# Patient Record
Sex: Female | Born: 1935 | Race: White | Hispanic: No | Smoking: Never smoker
Health system: Southern US, Community
[De-identification: ages and names within clinical notes are randomized; demographics above are authoritative.]

## PROBLEM LIST (undated history)

## (undated) DIAGNOSIS — I517 Cardiomegaly: Secondary | ICD-10-CM

## (undated) DIAGNOSIS — I890 Lymphedema, not elsewhere classified: Secondary | ICD-10-CM

## (undated) DIAGNOSIS — M8580 Other specified disorders of bone density and structure, unspecified site: Secondary | ICD-10-CM

## (undated) DIAGNOSIS — E041 Nontoxic single thyroid nodule: Secondary | ICD-10-CM

## (undated) DIAGNOSIS — N3289 Other specified disorders of bladder: Secondary | ICD-10-CM

## (undated) DIAGNOSIS — E785 Hyperlipidemia, unspecified: Secondary | ICD-10-CM

## (undated) DIAGNOSIS — I10 Essential (primary) hypertension: Secondary | ICD-10-CM

## (undated) DIAGNOSIS — K824 Cholesterolosis of gallbladder: Secondary | ICD-10-CM

## (undated) DIAGNOSIS — C50919 Malignant neoplasm of unspecified site of unspecified female breast: Secondary | ICD-10-CM

## (undated) DIAGNOSIS — R079 Chest pain, unspecified: Secondary | ICD-10-CM

## (undated) DIAGNOSIS — M1990 Unspecified osteoarthritis, unspecified site: Secondary | ICD-10-CM

## (undated) DIAGNOSIS — L719 Rosacea, unspecified: Secondary | ICD-10-CM

## (undated) DIAGNOSIS — J189 Pneumonia, unspecified organism: Secondary | ICD-10-CM

## (undated) HISTORY — DX: Hyperlipidemia, unspecified: E78.5

## (undated) HISTORY — DX: Rosacea, unspecified: L71.9

## (undated) HISTORY — DX: Chest pain, unspecified: R07.9

## (undated) HISTORY — PX: MASTECTOMY: SHX3

## (undated) HISTORY — DX: Cardiomegaly: I51.7

## (undated) HISTORY — PX: ABDOMINAL HYSTERECTOMY: SHX81

## (undated) HISTORY — PX: TONSILLECTOMY: SUR1361

## (undated) HISTORY — DX: Lymphedema, not elsewhere classified: I89.0

## (undated) HISTORY — PX: EYE SURGERY: SHX253

## (undated) HISTORY — DX: Cholesterolosis of gallbladder: K82.4

## (undated) HISTORY — DX: Essential (primary) hypertension: I10

## (undated) HISTORY — DX: Other specified disorders of bladder: N32.89

## (undated) HISTORY — DX: Other specified disorders of bone density and structure, unspecified site: M85.80

## (undated) HISTORY — DX: Nontoxic single thyroid nodule: E04.1

## (undated) HISTORY — PX: VESICOVAGINAL FISTULA CLOSURE W/ TAH: SUR271

## (undated) HISTORY — DX: Malignant neoplasm of unspecified site of unspecified female breast: C50.919

---

## 1995-01-22 HISTORY — PX: RECONSTRUCTION BREAST W/ TRAM FLAP: SUR1079

## 1997-03-08 ENCOUNTER — Ambulatory Visit (HOSPITAL_COMMUNITY): Admission: RE | Admit: 1997-03-08 | Discharge: 1997-03-08 | Payer: Self-pay | Admitting: Obstetrics and Gynecology

## 1998-03-15 ENCOUNTER — Encounter (HOSPITAL_COMMUNITY): Payer: Self-pay | Admitting: Oncology

## 1998-03-15 ENCOUNTER — Ambulatory Visit (HOSPITAL_COMMUNITY): Admission: RE | Admit: 1998-03-15 | Discharge: 1998-03-15 | Payer: Self-pay | Admitting: Oncology

## 1999-01-04 ENCOUNTER — Ambulatory Visit (HOSPITAL_COMMUNITY): Admission: RE | Admit: 1999-01-04 | Discharge: 1999-01-04 | Payer: Self-pay | Admitting: Oncology

## 1999-01-04 ENCOUNTER — Encounter (HOSPITAL_COMMUNITY): Payer: Self-pay | Admitting: Oncology

## 1999-03-21 ENCOUNTER — Encounter: Payer: Self-pay | Admitting: Obstetrics and Gynecology

## 1999-03-21 ENCOUNTER — Ambulatory Visit (HOSPITAL_COMMUNITY): Admission: RE | Admit: 1999-03-21 | Discharge: 1999-03-21 | Payer: Self-pay | Admitting: Obstetrics and Gynecology

## 2000-03-31 ENCOUNTER — Encounter: Payer: Self-pay | Admitting: Obstetrics and Gynecology

## 2000-03-31 ENCOUNTER — Ambulatory Visit (HOSPITAL_COMMUNITY): Admission: RE | Admit: 2000-03-31 | Discharge: 2000-03-31 | Payer: Self-pay | Admitting: Obstetrics and Gynecology

## 2000-06-25 ENCOUNTER — Other Ambulatory Visit: Admission: RE | Admit: 2000-06-25 | Discharge: 2000-06-25 | Payer: Self-pay | Admitting: Obstetrics and Gynecology

## 2000-06-27 ENCOUNTER — Ambulatory Visit (HOSPITAL_COMMUNITY): Admission: RE | Admit: 2000-06-27 | Discharge: 2000-06-27 | Payer: Self-pay | Admitting: Obstetrics and Gynecology

## 2000-06-27 ENCOUNTER — Encounter: Payer: Self-pay | Admitting: Obstetrics and Gynecology

## 2001-10-27 ENCOUNTER — Encounter (HOSPITAL_COMMUNITY): Payer: Self-pay | Admitting: Oncology

## 2001-10-27 ENCOUNTER — Ambulatory Visit (HOSPITAL_COMMUNITY): Admission: RE | Admit: 2001-10-27 | Discharge: 2001-10-27 | Payer: Self-pay | Admitting: Oncology

## 2002-08-03 ENCOUNTER — Ambulatory Visit (HOSPITAL_COMMUNITY): Admission: RE | Admit: 2002-08-03 | Discharge: 2002-08-03 | Payer: Self-pay | Admitting: Oncology

## 2002-08-03 ENCOUNTER — Encounter (HOSPITAL_COMMUNITY): Payer: Self-pay | Admitting: Oncology

## 2002-11-03 ENCOUNTER — Ambulatory Visit (HOSPITAL_COMMUNITY): Admission: RE | Admit: 2002-11-03 | Discharge: 2002-11-03 | Payer: Self-pay | Admitting: *Deleted

## 2002-12-22 ENCOUNTER — Emergency Department (HOSPITAL_COMMUNITY): Admission: EM | Admit: 2002-12-22 | Discharge: 2002-12-22 | Payer: Self-pay | Admitting: Emergency Medicine

## 2003-05-10 ENCOUNTER — Ambulatory Visit (HOSPITAL_BASED_OUTPATIENT_CLINIC_OR_DEPARTMENT_OTHER): Admission: RE | Admit: 2003-05-10 | Discharge: 2003-05-10 | Payer: Self-pay | Admitting: Plastic Surgery

## 2003-05-10 ENCOUNTER — Ambulatory Visit (HOSPITAL_COMMUNITY): Admission: RE | Admit: 2003-05-10 | Discharge: 2003-05-10 | Payer: Self-pay | Admitting: Plastic Surgery

## 2004-04-30 ENCOUNTER — Ambulatory Visit: Payer: Self-pay | Admitting: Oncology

## 2004-05-01 ENCOUNTER — Ambulatory Visit (HOSPITAL_COMMUNITY): Admission: RE | Admit: 2004-05-01 | Discharge: 2004-05-01 | Payer: Self-pay | Admitting: Oncology

## 2005-04-30 ENCOUNTER — Ambulatory Visit: Payer: Self-pay | Admitting: Oncology

## 2005-04-30 LAB — CBC WITH DIFFERENTIAL/PLATELET
BASO%: 1 % (ref 0.0–2.0)
Eosinophils Absolute: 0.1 10*3/uL (ref 0.0–0.5)
HCT: 43.9 % (ref 34.8–46.6)
LYMPH%: 33 % (ref 14.0–48.0)
MONO#: 0.4 10*3/uL (ref 0.1–0.9)
NEUT#: 3.6 10*3/uL (ref 1.5–6.5)
Platelets: 240 10*3/uL (ref 145–400)
RBC: 4.93 10*6/uL (ref 3.70–5.32)
WBC: 6.2 10*3/uL (ref 3.9–10.0)
lymph#: 2 10*3/uL (ref 0.9–3.3)

## 2005-04-30 LAB — COMPREHENSIVE METABOLIC PANEL
ALT: 11 U/L (ref 0–40)
Albumin: 4.4 g/dL (ref 3.5–5.2)
CO2: 24 mEq/L (ref 19–32)
Calcium: 9.2 mg/dL (ref 8.4–10.5)
Chloride: 107 mEq/L (ref 96–112)
Glucose, Bld: 97 mg/dL (ref 70–99)
Potassium: 4.2 mEq/L (ref 3.5–5.3)
Sodium: 141 mEq/L (ref 135–145)
Total Bilirubin: 0.8 mg/dL (ref 0.3–1.2)
Total Protein: 7.3 g/dL (ref 6.0–8.3)

## 2006-04-29 ENCOUNTER — Ambulatory Visit: Payer: Self-pay | Admitting: Oncology

## 2006-04-30 LAB — COMPREHENSIVE METABOLIC PANEL
AST: 15 U/L (ref 0–37)
Albumin: 4.3 g/dL (ref 3.5–5.2)
Alkaline Phosphatase: 39 U/L (ref 39–117)
BUN: 18 mg/dL (ref 6–23)
Creatinine, Ser: 0.79 mg/dL (ref 0.40–1.20)
Glucose, Bld: 98 mg/dL (ref 70–99)
Total Bilirubin: 0.6 mg/dL (ref 0.3–1.2)

## 2006-04-30 LAB — CBC WITH DIFFERENTIAL/PLATELET
Basophils Absolute: 0 10*3/uL (ref 0.0–0.1)
EOS%: 1.9 % (ref 0.0–7.0)
Eosinophils Absolute: 0.1 10*3/uL (ref 0.0–0.5)
HGB: 14.5 g/dL (ref 11.6–15.9)
LYMPH%: 37 % (ref 14.0–48.0)
MCH: 31.4 pg (ref 26.0–34.0)
MCV: 88 fL (ref 81.0–101.0)
MONO%: 7.9 % (ref 0.0–13.0)
NEUT#: 2.7 10*3/uL (ref 1.5–6.5)
NEUT%: 52.5 % (ref 39.6–76.8)
Platelets: 223 10*3/uL (ref 145–400)
RDW: 13.9 % (ref 11.3–14.5)

## 2006-05-12 ENCOUNTER — Ambulatory Visit (HOSPITAL_COMMUNITY): Admission: RE | Admit: 2006-05-12 | Discharge: 2006-05-12 | Payer: Self-pay | Admitting: Oncology

## 2006-06-24 ENCOUNTER — Encounter: Admission: RE | Admit: 2006-06-24 | Discharge: 2006-06-24 | Payer: Self-pay | Admitting: Oncology

## 2006-11-03 ENCOUNTER — Encounter (INDEPENDENT_AMBULATORY_CARE_PROVIDER_SITE_OTHER): Payer: Self-pay | Admitting: Obstetrics and Gynecology

## 2006-11-04 ENCOUNTER — Inpatient Hospital Stay (HOSPITAL_COMMUNITY): Admission: RE | Admit: 2006-11-04 | Discharge: 2006-11-05 | Payer: Self-pay | Admitting: Obstetrics and Gynecology

## 2007-05-06 ENCOUNTER — Ambulatory Visit: Payer: Self-pay | Admitting: Oncology

## 2007-05-11 LAB — COMPREHENSIVE METABOLIC PANEL
Albumin: 4.3 g/dL (ref 3.5–5.2)
Alkaline Phosphatase: 47 U/L (ref 39–117)
BUN: 9 mg/dL (ref 6–23)
CO2: 26 mEq/L (ref 19–32)
Glucose, Bld: 88 mg/dL (ref 70–99)
Total Bilirubin: 0.6 mg/dL (ref 0.3–1.2)

## 2007-05-11 LAB — CBC WITH DIFFERENTIAL/PLATELET
Basophils Absolute: 0 10*3/uL (ref 0.0–0.1)
Eosinophils Absolute: 0.1 10*3/uL (ref 0.0–0.5)
HCT: 39.8 % (ref 34.8–46.6)
HGB: 13.9 g/dL (ref 11.6–15.9)
LYMPH%: 38.5 % (ref 14.0–48.0)
MCV: 88.3 fL (ref 81.0–101.0)
MONO%: 7.3 % (ref 0.0–13.0)
NEUT#: 3.1 10*3/uL (ref 1.5–6.5)
Platelets: 235 10*3/uL (ref 145–400)

## 2007-05-11 LAB — LACTATE DEHYDROGENASE: LDH: 212 U/L (ref 94–250)

## 2007-06-30 ENCOUNTER — Encounter: Admission: RE | Admit: 2007-06-30 | Discharge: 2007-06-30 | Payer: Self-pay | Admitting: Obstetrics and Gynecology

## 2007-07-02 ENCOUNTER — Encounter: Admission: RE | Admit: 2007-07-02 | Discharge: 2007-07-02 | Payer: Self-pay | Admitting: Internal Medicine

## 2007-07-14 ENCOUNTER — Ambulatory Visit (HOSPITAL_COMMUNITY): Admission: RE | Admit: 2007-07-14 | Discharge: 2007-07-14 | Payer: Self-pay | Admitting: *Deleted

## 2007-07-14 ENCOUNTER — Encounter (INDEPENDENT_AMBULATORY_CARE_PROVIDER_SITE_OTHER): Payer: Self-pay | Admitting: *Deleted

## 2008-01-19 ENCOUNTER — Ambulatory Visit (HOSPITAL_COMMUNITY): Admission: RE | Admit: 2008-01-19 | Discharge: 2008-01-19 | Payer: Self-pay | Admitting: *Deleted

## 2008-03-04 ENCOUNTER — Encounter: Admission: RE | Admit: 2008-03-04 | Discharge: 2008-03-04 | Payer: Self-pay | Admitting: Internal Medicine

## 2008-05-06 ENCOUNTER — Ambulatory Visit: Payer: Self-pay | Admitting: Oncology

## 2008-05-16 LAB — CBC WITH DIFFERENTIAL/PLATELET
Basophils Absolute: 0 10*3/uL (ref 0.0–0.1)
EOS%: 1.4 % (ref 0.0–7.0)
HGB: 15.3 g/dL (ref 11.6–15.9)
MCH: 31 pg (ref 25.1–34.0)
MCV: 90.2 fL (ref 79.5–101.0)
MONO%: 7.1 % (ref 0.0–14.0)
NEUT%: 54.5 % (ref 38.4–76.8)
RDW: 14.6 % — ABNORMAL HIGH (ref 11.2–14.5)

## 2008-05-16 LAB — COMPREHENSIVE METABOLIC PANEL
AST: 15 U/L (ref 0–37)
Alkaline Phosphatase: 42 U/L (ref 39–117)
BUN: 12 mg/dL (ref 6–23)
Creatinine, Ser: 0.84 mg/dL (ref 0.40–1.20)
Potassium: 4.3 mEq/L (ref 3.5–5.3)
Total Bilirubin: 0.8 mg/dL (ref 0.3–1.2)

## 2008-07-05 ENCOUNTER — Encounter: Admission: RE | Admit: 2008-07-05 | Discharge: 2008-07-05 | Payer: Self-pay | Admitting: Oncology

## 2008-09-07 ENCOUNTER — Encounter: Admission: RE | Admit: 2008-09-07 | Discharge: 2008-09-07 | Payer: Self-pay | Admitting: Internal Medicine

## 2009-05-15 ENCOUNTER — Ambulatory Visit: Payer: Self-pay | Admitting: Oncology

## 2009-05-16 LAB — CBC WITH DIFFERENTIAL/PLATELET
HCT: 43.2 % (ref 34.8–46.6)
LYMPH%: 33.4 % (ref 14.0–49.7)
MCH: 31.4 pg (ref 25.1–34.0)
MONO#: 0.4 10*3/uL (ref 0.1–0.9)
NEUT#: 3.1 10*3/uL (ref 1.5–6.5)
RBC: 4.74 10*6/uL (ref 3.70–5.45)
lymph#: 1.8 10*3/uL (ref 0.9–3.3)

## 2009-05-16 LAB — COMPREHENSIVE METABOLIC PANEL
ALT: 16 U/L (ref 0–35)
Alkaline Phosphatase: 48 U/L (ref 39–117)
BUN: 17 mg/dL (ref 6–23)
CO2: 27 mEq/L (ref 19–32)
Creatinine, Ser: 0.86 mg/dL (ref 0.40–1.20)
Glucose, Bld: 75 mg/dL (ref 70–99)
Total Bilirubin: 0.8 mg/dL (ref 0.3–1.2)

## 2009-07-09 ENCOUNTER — Inpatient Hospital Stay (HOSPITAL_COMMUNITY): Admission: EM | Admit: 2009-07-09 | Discharge: 2009-07-11 | Payer: Self-pay | Admitting: Emergency Medicine

## 2009-07-09 ENCOUNTER — Ambulatory Visit: Payer: Self-pay | Admitting: Cardiology

## 2009-07-10 ENCOUNTER — Encounter (INDEPENDENT_AMBULATORY_CARE_PROVIDER_SITE_OTHER): Payer: Self-pay | Admitting: Emergency Medicine

## 2009-07-12 ENCOUNTER — Encounter: Payer: Self-pay | Admitting: Cardiovascular Disease

## 2009-07-17 ENCOUNTER — Telehealth (INDEPENDENT_AMBULATORY_CARE_PROVIDER_SITE_OTHER): Payer: Self-pay | Admitting: *Deleted

## 2009-07-18 ENCOUNTER — Encounter: Payer: Self-pay | Admitting: Cardiology

## 2009-07-18 ENCOUNTER — Ambulatory Visit: Payer: Self-pay | Admitting: Cardiology

## 2009-07-18 ENCOUNTER — Ambulatory Visit: Payer: Self-pay

## 2009-07-18 ENCOUNTER — Encounter (HOSPITAL_COMMUNITY): Admission: RE | Admit: 2009-07-18 | Discharge: 2009-09-27 | Payer: Self-pay | Admitting: Internal Medicine

## 2009-07-25 DIAGNOSIS — I10 Essential (primary) hypertension: Secondary | ICD-10-CM | POA: Insufficient documentation

## 2009-07-25 DIAGNOSIS — E785 Hyperlipidemia, unspecified: Secondary | ICD-10-CM

## 2009-07-27 ENCOUNTER — Ambulatory Visit: Payer: Self-pay | Admitting: Internal Medicine

## 2009-07-27 DIAGNOSIS — R079 Chest pain, unspecified: Secondary | ICD-10-CM | POA: Insufficient documentation

## 2009-08-01 ENCOUNTER — Encounter: Admission: RE | Admit: 2009-08-01 | Discharge: 2009-08-01 | Payer: Self-pay | Admitting: Internal Medicine

## 2009-08-08 ENCOUNTER — Encounter: Admission: RE | Admit: 2009-08-08 | Discharge: 2009-08-08 | Payer: Self-pay | Admitting: Obstetrics and Gynecology

## 2009-09-18 DIAGNOSIS — Z862 Personal history of diseases of the blood and blood-forming organs and certain disorders involving the immune mechanism: Secondary | ICD-10-CM

## 2009-09-18 DIAGNOSIS — K828 Other specified diseases of gallbladder: Secondary | ICD-10-CM

## 2009-09-18 DIAGNOSIS — M949 Disorder of cartilage, unspecified: Secondary | ICD-10-CM

## 2009-09-18 DIAGNOSIS — L719 Rosacea, unspecified: Secondary | ICD-10-CM

## 2009-09-18 DIAGNOSIS — Z8639 Personal history of other endocrine, nutritional and metabolic disease: Secondary | ICD-10-CM

## 2009-09-18 DIAGNOSIS — M899 Disorder of bone, unspecified: Secondary | ICD-10-CM | POA: Insufficient documentation

## 2009-09-18 DIAGNOSIS — N3289 Other specified disorders of bladder: Secondary | ICD-10-CM

## 2009-09-18 HISTORY — DX: Rosacea, unspecified: L71.9

## 2009-09-18 HISTORY — DX: Other specified diseases of gallbladder: K82.8

## 2009-09-19 ENCOUNTER — Ambulatory Visit: Payer: Self-pay | Admitting: Cardiovascular Disease

## 2010-02-11 ENCOUNTER — Encounter: Payer: Self-pay | Admitting: Obstetrics and Gynecology

## 2010-02-22 NOTE — Progress Notes (Signed)
Summary: Nuclear Pre-Procedure  Phone Note Outgoing Call Call back at [facility] Phone [phone]   Call placed by: Stanton Kidney, EMT-P,  July 17, 2009 2:21 PM Action Taken: Phone Call Completed Summary of Call: Left message with information on Myoview Information Sheet (see scanned document for details).     Nuclear Med Background Indications for Stress Test: Evaluation for Ischemia, Post Hospital  Indications Comments: 07/09/09 CP: M/I R/O, (-) enzymes  History: Echo, History of Chemo, Myocardial Perfusion Study  History Comments: 12/04 MPS: EF=62%, NL 6/11 Echo: EF=60%, LVH  Symptoms: Chest Pain, Dizziness, DOE, Nausea    Nuclear Pre-Procedure Cardiac Risk Factors: Family History - CAD, Hypertension, Lipids

## 2010-02-22 NOTE — Assessment & Plan Note (Signed)
Summary: Cardiology Nuclear Study  Nuclear Med Background Indications for Stress Test: Evaluation for Ischemia, Post Hospital  Indications Comments: 07/09/09 chest pain, pneumonia; MI r/o  History: Echo, History of Chemo, Myocardial Perfusion Study  History Comments: '04 EAV:[id number], EF=62%; 6/11 Echo: EF=60%, LVH  Symptoms: Chest Pain, Dizziness, DOE, Fatigue, Nausea, Rapid HR, SOB  Symptoms Comments: Last episode of CP:10-days ago.   Nuclear Pre-Procedure Cardiac Risk Factors: Family History - CAD, History of Smoking, Hypertension, Lipids Caffeine/Decaff Intake: None NPO After: 9:00 PM Lungs: Clear IV 0.9% NS with Angio Cath: 22g     IV Site: (L) Wrist IV Started by: Irean Hong RN Chest Size (in) 36     Cup Size C     Height (in): 65.5 Weight (lb): 174 BMI: 28.62  Nuclear Med Study 1 or 2 day study:  1 day     Stress Test Type:  Stress Reading MD:  Willa Rough, MD     Referring MD:  Charlton Haws, MD Resting Radionuclide:  Technetium 30m Tetrofosmin     Resting Radionuclide Dose:  10.8 mCi  Stress Radionuclide:  Technetium 13m Tetrofosmin     Stress Radionuclide Dose:  33 mCi   Stress Protocol Exercise Time (min):  6:01 min     Max HR:  133 bpm     Predicted Max HR:  148 bpm  Max Systolic BP: 180 mm Hg     Percent Max HR:  89.86 %     METS: 7.0 Rate Pressure Product:  91478    Stress Test Technologist:  Rea College CMA-N     Nuclear Technologist:  Domenic Polite CNMT  Rest Procedure  Myocardial perfusion imaging was performed at rest 45 minutes following the intravenous administration of Myoview Technetium 76m Tetrofosmin.  Stress Procedure  The patient exercised for 6:01.  The patient stopped due to fatigue and denied any chest pain.  There were nonspecific ST-T wave changes, occasional PAC's and a short burst of PSVT.  Myoview was injected at peak exercise and myocardial perfusion imaging was performed after a brief delay.  QPS Raw Data Images:  Normal; no  motion artifact; normal heart/lung ratio. Stress Images:  There is normal uptake in all areas. Rest Images:  Normal homogeneous uptake in all areas of the myocardium. Subtraction (SDS):  No evidence of ischemia. Transient Ischemic Dilatation:  .99  (Normal <1.22)  Lung/Heart Ratio:  .26  (Normal <0.45)  Quantitative Gated Spect Images QGS EDV:  89 ml QGS ESV:  28 ml QGS EF:  68 % QGS cine images:  Normal motion  Findings Normal nuclear study      Overall Impression  Exercise Capacity: Fair exercise capacity. BP Response: Normal blood pressure response. Clinical Symptoms: No chest pain ECG Impression: No significant ST segment change suggestive of ischemia. Overall Impression Comments: Brief SVT with stress. There is no scar or ischemia.  Appended Document: Cardiology Nuclear Study normal nuclear  ? whose patient  Appended Document: Cardiology Nuclear Study appt 07-27-09 to discuss

## 2010-02-22 NOTE — Assessment & Plan Note (Signed)
Summary: eph/ f/u on stress test/ gd   Visit Type:  Follow-up  CC:  left arm tingling.  History of Present Illness: this is a 75 year old white female patient who was hospitalized for community-acquired pneumonia 219 2011. She was having some sharp shooting chest pain and left arm tingling. She ruled out for an MI. Echocardiogram showed LVH with normal LV function ejection fraction 60%. She was scheduled for a stress Myoview as an outpatient which was performed July 18, 2009. This showed normal LV function, normal wall motion, no ischemia, there was brief SVT with stress but no scar or ischemia.  The patient has had ongoing left arm tingling that is pretty much constant. She has had some neck pain and was told that pneumonia could have caused a nerve problem. She denies chest tightness, pressure, dyspnea, dyspnea on exertion, dizziness, palpitations, or presyncope.  Current Medications (verified): 1)  Crestor 10 Mg Tabs (Rosuvastatin Calcium) .... Take One Daily 2)  Micardis 40 Mg Tabs (Telmisartan) .... Take One Daily 3)  Fish Oil 1200 Mg Caps (Omega-3 Fatty Acids) .... Take Two Daily 4)  Calcium-Vitamin D 250-125 Mg-Unit Tabs (Calcium Carbonate-Vitamin D) 5)  Aspir-Low 81 Mg Tbec (Aspirin) .... Take One Daily  Allergies: 1)  ! Sulfa  Past History:  Past Medical History: Last updated: 07/25/2009 Significant for hypertension and hyperlipidemia.   She has a history of breast cancer and has had a prior right mastectomy   and chemotherapy.  She has had a prior hysterectomy and tonsillectomy.      Family History: Reviewed history from 07/25/2009 and no changes required.   Positive for coronary artery disease as her father died   of an MI at age 79.  Her mother had congestive heart failure and died at   age 85.   Review of Systems       see history of present illness  Vital Signs:  Patient profile:   75 year old female Height:      65.5 inches Weight:      180 pounds Pulse  rate:   72 / minute Pulse rhythm:   regular BP sitting:   148 / 80  (left arm)  Vitals Entered By: Jacquelin Hawking, CMA (July 27, 2009 12:16 PM)  Physical Exam  General:   Well-nournished, in no acute distress. Neck: No JVD, HJR, Bruit, or thyroid enlargement Lungs: No tachypnea, clear without wheezing, rales, or rhonchi Cardiovascular: RRR, PMI not displaced, heart sounds normal, no murmurs, gallops, bruit, thrill, or heave. Abdomen: BS normal. Soft without organomegaly, masses, lesions or tenderness. Extremities: without cyanosis, clubbing or edema. Good distal pulses bilateral SKin: Warm, no lesions or rashes  Musculoskeletal: No deformities Neuro: no focal signs    Impression & Recommendations:  Problem # 1:  CHEST PAIN-UNSPECIFIED (ICD-786.50) Patient had atypical chest pain in setting of community-acquired pneumonia. She ruled out for an MI. Stressed Myoview was normal July 18, 2009. Follow up in one year. Her updated medication list for this problem includes:    Aspir-low 81 Mg Tbec (Aspirin) .Marland Kitchen... Take one daily  Problem # 2:  HYPERLIPIDEMIA (ICD-272.4) treated Her updated medication list for this problem includes:    Crestor 10 Mg Tabs (Rosuvastatin calcium) .Marland Kitchen... Take one daily  Problem # 3:  HYPERTENSION (ICD-401.9) controlled Her updated medication list for this problem includes:    Micardis 40 Mg Tabs (Telmisartan) .Marland Kitchen... Take one daily    Aspir-low 81 Mg Tbec (Aspirin) .Marland Kitchen... Take one daily  Patient Instructions:  1)  Your physician recommends that you schedule a follow-up appointment in: 12 months with Dr. Eden Emms . The office will mail you a reminder letter 2 months prior appointment date.

## 2010-02-22 NOTE — Progress Notes (Signed)
Summary: [facility]   Imported By: Earl Many 09/19/2009 11:42:52  _____________________________________________________________________  External Attachment:    Type:   Image     Comment:   External Document

## 2010-02-22 NOTE — Assessment & Plan Note (Signed)
Summary: ROV/DM   History of Present Illness: this is a 75 year old white female patient who was hospitalized for community-acquired pneumonia 219 2011. She was having some sharp shooting chest pain and left arm tingling. She ruled out for an MI. Echocardiogram showed LVH with normal LV function ejection fraction 60%. She was scheduled for a stress Myoview as an outpatient which was performed July 18, 2009. This showed normal LV function, normal wall motion, no ischemia, there was brief SVT with stress but no scar or ischemia. She currently has no complaints.  I think that the multiple previous cardiac referrals from Dr Renne Crigler have mostly been because of a positive family history in both mother and father.  Current Problems (verified): 1)  Chest Pain-unspecified  (ICD-786.50) 2)  Hyperlipidemia  (ICD-272.4) 3)  Hypertension  (ICD-401.9) 4)  Polyp, Gallbladder  (ICD-575.8) 5)  Thyroid Nodule, Hx of  (ICD-V12.2) 6)  Irritable Bladder  (ICD-596.8) 7)  Acne Rosacea  (ICD-695.3) 8)  Osteopenia  (ICD-733.90)  Current Medications (verified): 1)  Crestor 10 Mg Tabs (Rosuvastatin Calcium) .... Take One Daily 2)  Micardis 40 Mg Tabs (Telmisartan) .... Take One Daily 3)  Fish Oil 1200 Mg Caps (Omega-3 Fatty Acids) .... Take Two Daily 4)  Calcium 600 1500 Mg Tabs (Calcium Carbonate) .... 2 Tabs  By Mouth Once Daily 5)  Aspir-Low 81 Mg Tbec (Aspirin) .... Take One Daily  Allergies (verified): 1)  ! Sulfa  Past History:  Past Medical History: Last updated: 09/18/2009 Histiry of cardiomegaly but with normal echocardiogram and cardiac workup by Dr. Eden Emms  CHEST PAIN-UNSPECIFIED   Significant for hypertension and hyperlipidemia.   POLYP, GALLBLADDER  THYROID NODULE, HX OF  IRRITABLE BLADDER  ACNE ROSACEA OSTEOPENIA  She has a history of breast cancer and has had a prior right mastectomy  and chemotherapy.  She has had a prior hysterectomy and tonsillectomy.   Chronic lymphedema of the  right arm status post lumph node infection    History of prominent clavicle  Past Surgical History: Last updated: 07/25/2009  She has had a prior hysterectomy and tonsillectomy, and mastectomy.     Family History: Last updated: 07/25/2009   Positive for coronary artery disease as her father died   of an MI at age 51.  Her mother had congestive heart failure and died at   age 71.   Social History: Last updated: 07/25/2009  She does not smoke.  She occasionally consumes alcohol.   Review of Systems       Denies fever, malais, weight loss, blurry vision, decreased visual acuity, cough, sputum, SOB, hemoptysis, pleuritic pain, palpitaitons, heartburn, abdominal pain, melena, lower extremity edema, claudication, or rash.   Vital Signs:  Patient profile:   75 year old female Height:      65 inches Weight:      178 pounds BMI:     29.73 Pulse rate:   83 / minute Resp:     12 per minute BP sitting:   115 / 66  (left arm)  Vitals Entered By: Kem Parkinson (September 19, 2009 9:09 AM)  Physical Exam  General:  Affect appropriate Healthy:  appears stated age HEENT: normal Neck supple with no adenopathy JVP normal no bruits no thyromegaly Lungs clear with no wheezing and good diaphragmatic motion Heart:  S1/S2 no murmur,rub, gallop or click PMI normal Abdomen: benighn, BS positve, no tenderness, no AAA no bruit.  No HSM or HJR Distal pulses intact with no bruits No edema Neuro non-focal Skin  warm and dry    Impression & Recommendations:  Problem # 1:  CHEST PAIN-UNSPECIFIED (ICD-786.50) Resolved with normal myovue.  Continue ASA Her updated medication list for this problem includes:    Aspir-low 81 Mg Tbec (Aspirin) .Marland Kitchen... Take one daily  Problem # 2:  HYPERLIPIDEMIA (ICD-272.4) Labs per Dr Renne Crigler continue statin Her updated medication list for this problem includes:    Crestor 10 Mg Tabs (Rosuvastatin calcium) .Marland Kitchen... Take one daily  Problem # 3:  HYPERTENSION  (ICD-401.9) Well controlled Her updated medication list for this problem includes:    Micardis 40 Mg Tabs (Telmisartan) .Marland Kitchen... Take one daily    Aspir-low 81 Mg Tbec (Aspirin) .Marland Kitchen... Take one daily  Patient Instructions: 1)  Your physician recommends that you schedule a follow-up appointment in: ONE YEAR

## 2010-04-08 LAB — BASIC METABOLIC PANEL
CO2: 23 mEq/L (ref 19–32)
CO2: 24 mEq/L (ref 19–32)
Calcium: 9.1 mg/dL (ref 8.4–10.5)
Chloride: 105 mEq/L (ref 96–112)
GFR calc Af Amer: >60 mL/min (ref >60)
GFR calc Af Amer: >60 mL/min (ref >60)
GFR calc non Af Amer: >60 mL/min (ref >60)
Potassium: 3.5 mEq/L (ref 3.5–5.1)
Sodium: 136 mEq/L (ref 135–145)

## 2010-04-08 LAB — POCT CARDIAC MARKERS
CKMB, poc: 1 ng/mL (ref 1.0–8.0)
Myoglobin, poc: 88.1 ng/mL (ref 12–200)

## 2010-04-08 LAB — DIFFERENTIAL
Basophils Absolute: 0 10*3/uL (ref 0.0–0.1)
Basophils Relative: 0 % (ref 0–1)
Lymphocytes Relative: 32 % (ref 12–46)
Lymphs Abs: 1.7 10*3/uL (ref 0.7–4.0)
Monocytes Absolute: 0.3 10*3/uL (ref 0.1–1.0)

## 2010-04-08 LAB — URINALYSIS, ROUTINE W REFLEX MICROSCOPIC
Bilirubin Urine: NEGATIVE
Ketones, ur: NEGATIVE mg/dL
Nitrite: NEGATIVE
Protein, ur: NEGATIVE mg/dL
Specific Gravity, Urine: 1.004 — ABNORMAL LOW (ref 1.005–1.030)

## 2010-04-08 LAB — TSH: TSH: 1.656 u[IU]/mL (ref 0.350–4.500)

## 2010-04-08 LAB — CBC
HCT: 39.2 % (ref 36.0–46.0)
HCT: 43.9 % (ref 36.0–46.0)
Hemoglobin: 13.5 g/dL (ref 12.0–15.0)
Hemoglobin: 15.3 g/dL — ABNORMAL HIGH (ref 12.0–15.0)
MCHC: 34.5 g/dL (ref 30.0–36.0)
MCHC: 34.8 g/dL (ref 30.0–36.0)
MCV: 92 fL (ref 78.0–100.0)
Platelets: 204 10*3/uL (ref 150–400)
RBC: 4.26 MIL/uL (ref 3.87–5.11)
RBC: 4.82 MIL/uL (ref 3.87–5.11)
WBC: 5.3 10*3/uL (ref 4.0–10.5)

## 2010-04-08 LAB — LIPID PANEL
LDL Cholesterol: 55 mg/dL (ref 0–99)
Total CHOL/HDL Ratio: 3 RATIO
VLDL: 32 mg/dL (ref 0–40)

## 2010-04-08 LAB — CK TOTAL AND CKMB (NOT AT ARMC)
Relative Index: 1.9 (ref 0.0–2.5)
Total CK: 138 U/L (ref 7–177)

## 2010-04-08 LAB — CARDIAC PANEL(CRET KIN+CKTOT+MB+TROPI)
CK, MB: 2.1 ng/mL (ref 0.3–4.0)
Total CK: 110 U/L (ref 7–177)

## 2010-05-08 ENCOUNTER — Other Ambulatory Visit: Payer: Self-pay | Admitting: Internal Medicine

## 2010-05-08 DIAGNOSIS — R1011 Right upper quadrant pain: Secondary | ICD-10-CM

## 2010-05-14 ENCOUNTER — Other Ambulatory Visit: Payer: Self-pay | Admitting: Dermatology

## 2010-05-16 ENCOUNTER — Ambulatory Visit
Admission: RE | Admit: 2010-05-16 | Discharge: 2010-05-16 | Disposition: A | Discharge status: Home / Self Care | Payer: MEDICARE | Source: Ambulatory Visit | Attending: Internal Medicine | Admitting: Internal Medicine

## 2010-05-16 DIAGNOSIS — R1011 Right upper quadrant pain: Secondary | ICD-10-CM

## 2010-06-05 NOTE — Op Note (Signed)
NAMESTELLA, [name]              ACCOUNT NO.:  [id number]   MEDICAL RECORD NO.:  [id number]          PATIENT TYPE:  OIB   LOCATION:  1538                         FACILITY:  [facility]   PHYSICIAN:  Mark C. Vernie Ammons, M.D.  DATE OF BIRTH:  Feb 18, 1935   DATE OF PROCEDURE:  11/03/2006  DATE OF DISCHARGE:                               OPERATIVE REPORT   PREOPERATIVE DIAGNOSIS:  Stress urinary incontinence.   POSTOPERATIVE DIAGNOSIS:  Stress urinary incontinence.   PROCEDURE:  Transobturator sling.   SURGEON:  Mark C. Vernie Ammons, M.D.   ANESTHESIA:  General.   BLOOD LOSS:  Minimal.   SPECIMENS:  None.   DRAINS:  16-French Foley catheter.   COMPLICATIONS:  None.   INDICATIONS:  The patient is a 75 year old white female with stress  urinary continence demonstrated by urodynamics and by history.  She also  was found to have some mild bladder instability.  We discussed treatment  options for this, and the patient has elected to proceed surgically,  understanding the risks and complications.   DESCRIPTION OF OPERATION:  After informed consent, the patient was  brought to the major O.R., placed on the table, administered general  anesthesia, and then moved to the dorsal lithotomy position.  Dr. Ambrose Mantle  then proceeded with transvaginal hysterectomy as well as anterior and  posterior repair.  That will be dictated by him separately.   Through the anterior vaginal wall incision that was previously made for  her anterior repair, I used Strulle scissors to dissect lateral to the  urethra on each side.  I was able to situate my index finger into this  location and palpate the undersurface of the symphysis pubis.  I noted  the urethra to be normal to palpation, and the Foley catheter balloon  was noted to be at the bladder neck, allowing me to determine the  midurethral location.   Attention was then directed to the skin overlying the obturator fossa.  I chose a spot 5 cm lateral to the  midline on both right and left sides  at the level of the clitoris.  I made a stab incision at both these  locations and then drained the bladder completely through the Foley  catheter.  I then passed the transobturator trocar through the skin  incision, through the obturator fossa at its medial aspect, and then  back behind the symphysis pubis, directing it out at the midurethral  level by palpation against my index finger.  This was performed on first  right and left sides.  I then affixed the transobturator sling to the  trocar on each side and pulled this through the skin.   The catheter was removed, and a 17-French rigid cystoscope with 70-  degrees lens was inserted in the bladder.  The bladder was inspected and  noted to be free of any tumor, stones, or worrisome lesions.  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  Ureteral orifices were noted to be  of normal configuration and position.  There was clear  reflux from the  left.  I did not see any efflux on the right, but I did not watch very  long.  I inspected the bladder and noted no injury, perforation, or  foreign body.  No lesions were found within the bladder, and no bleeding  was identified.  I therefore removed the cystoscope and noted the  urethra to be normal, intact, and without injury or lesion.   The catheter was reinserted in the bladder, and I then used antibiotic  solution to irrigate the vaginal incision.  After that, I placed a  forceps beneath the sling and removed the plastic sheathing from the  sling material on both right and left sides.  I noted no undue tension  on the sling.  It did seem to be located slightly more proximal than I  wanted, so I placed a single 3-0 Vicryl suture in a figure-of-eight  fashion through the more proximal urethra and the sling, moving it up to  the midurethral region nicely.  The wound was again  copiously irrigated,  and the excess sling material was excised at the skin level.  The skin  incisions were closed with Dermabond.  Dr. Ambrose Mantle returned and then  closed the remainder of the vaginal incision.  The patient tolerated  this portion of the procedure well, with no intraoperative  complications.  She will be observed in the hospital, and her catheter  will remain indwelling overnight and removed in the morning for a  voiding trial.      Loraine Leriche C. Vernie Ammons, M.D.  Electronically Signed     MCO/MEDQ  D:  11/03/2006  T:  11/04/2006  Job:  [id number]   cc:   Malachi Pro. Ambrose Mantle, M.D.  Fax: [phone]

## 2010-06-05 NOTE — Discharge Summary (Signed)
[name], [name]              ACCOUNT NO.:  [id number]   MEDICAL RECORD NO.:  [id number]          PATIENT TYPE:  INP   LOCATION:  1538                         FACILITY:  [facility]   PHYSICIAN:  Malachi Pro. Ambrose Mantle, M.D. DATE OF BIRTH:  October 20, 1935   DATE OF ADMISSION:  11/03/2006  DATE OF DISCHARGE:  11/05/2006                               DISCHARGE SUMMARY   HISTORY OF PRESENT ILLNESS:  A 75 year old white female with cystocele,  rectocele, uterine prolapse and stress urinary incontinence admitted for  vaginal hysterectomy, A&P repair and sling procedure.  The patient  underwent the procedure, the vaginal hysterectomy A&P repair by Dr.  Ambrose Mantle and sling procedure by Dr. Vernie Ammons on November 03, 2006.  Blood  loss was probably no more than 150-200 mL.  There were no apparent  complications.  Postoperatively, she did well.  The catheter was removed  on the first postop day.  She was unable to void, so the catheter was  reinserted and Dr. Vernie Ammons gave her instructions on how to manage the  catheter as an outpatient.  On postop day #2, she was afebrile.  Blood  pressure was normal.  Catheter was draining clear urine.  She was  tolerating liquids and solids.  She had passed flatus.  She had not had  a bowel movement.  She was ready for discharge.  The pack had been  removed on postop day #1 and was only moderately stained.  White count  on admission was 5100, hemoglobin 14.1, hematocrit 40.7, platelet count  226,000.  Comprehensive metabolic profile in a nonfasting state was  normal except for glucose of 106, alkaline phosphatase was 37.  Chest x-  ray done in April was normal.  Echocardiogram done in July 2001 was  normal.  EKG was done in May 2008 and it showed a first-degree heart  block, left ventricular hypertrophy voltage.  Pathology report is not  available.   FINAL DIAGNOSES:  1. Cystocele.  2. Rectocele.  3. Uterine prolapse.  4. Stress urinary incontinence.   OPERATION:   Vaginal hysterectomy, A&P repair, sling procedure.   FINAL CONDITION:  Improved.   INSTRUCTIONS:  No vaginal entrance, no heavy lifting, no strenuous  activity.  Call with any fever above 100.4 degrees.  Call with any  unusual problems.  Follow Dr. Margrett Rud instructions on when to remove  the catheter.  Continue her Actonel, Micardis, Crestor, aspirin, fish  oil, Caltrate, vitamin E.  She is going to suspend her Detrol for now,  and I have given her a prescription for Percocet 5/325, 30 tablets one  every 4-6 hours as needed for pain.  She is to return to see me in 2  weeks for follow-up examination.      Malachi Pro. Ambrose Mantle, M.D.  Electronically Signed     TFH/MEDQ  D:  11/05/2006  T:  11/06/2006  Job:  [id number]

## 2010-06-05 NOTE — Op Note (Signed)
[name], [name]              ACCOUNT NO.:  [id number]   MEDICAL RECORD NO.:  [id number]          PATIENT TYPE:  AMB   LOCATION:  ENDO                         FACILITY:  [facility]   PHYSICIAN:  Georgiana Spinner, M.D.    DATE OF BIRTH:  01-Nov-1935   DATE OF PROCEDURE:  DATE OF DISCHARGE:                               OPERATIVE REPORT   PROCEDURE:  Colonoscopy.   INDICATIONS:  Colon cancer screening.   ANESTHESIA:  Fentanyl 100 mcg, Versed 10 mg.   DESCRIPTION OF PROCEDURE:  With the patient mildly sedated in the left  lateral decubitus position, the Pentax videoscopic pediatric colonoscope  was inserted into the rectum, passed under direct vision to the cecum  with pressure applied.  The cecum was identified by the ileocecal valve  and appendiceal orifice, both of which were photographed.  From this  point the colonoscope was slowly withdrawn, taking circumferential views  of the colonic mucosa, stopping only in the rectum, which appeared  normal on direct and retroflexed view.  The endoscope was straightened  and withdrawn.  The patient's vital signs, pulse oximeter remained  stable.  The patient tolerated procedure well without apparent  complications.   FINDINGS:  Negative examination.   PLAN:  Repeat examination in 5-10 years.           ______________________________  Georgiana Spinner, M.D.     GMO/MEDQ  D:  01/19/2008  T:  01/19/2008  Job:  [id number]

## 2010-06-05 NOTE — Op Note (Signed)
NAMECOURTNEY, [name]              ACCOUNT NO.:  [id number]   MEDICAL RECORD NO.:  [id number]          PATIENT TYPE:  AMB   LOCATION:  ENDO                         FACILITY:  [facility]   PHYSICIAN:  Georgiana Spinner, M.D.    DATE OF BIRTH:  [birth year]   DATE OF PROCEDURE:  07/14/2007  DATE OF DISCHARGE:                               OPERATIVE REPORT   PROCEDURE:  Upper endoscopy.   INDICATIONS:  Abdominal pain.   ANESTHESIA:  1. Fentanyl 50 mcg.  2. Versed 3 mg.   PROCEDURE:  With the patient mildly sedated in the left lateral  decubitus position, the Pentax videoscopic endoscope was inserted in the  mouth and passed under direct vision through the esophagus, which  appeared normal, into the stomach.  Fundus, body, antrum showed atrophic  changes that were photographed and biopsied.  Duodenal bulb and second  portion of the duodenum appeared normal.  From this point, the endoscope  was slowly withdrawn, taking circumferential views of the duodenal  mucosa until the endoscope had been pulled back into the stomach and  placed in retroflexion to view the stomach from below.  The endoscope  was straightened and withdrawn, taking circumferential views of the  remaining gastric and esophageal mucosa.  The patient's vital signs and  pulse oximeter remained stable.  The patient tolerated the procedure  well, without apparent complications.   FINDINGS:  Atrophic changes, biopsied.  Await biopsy report.  The  patient will call me for results and follow up with me as needed as an  outpatient.           ______________________________  Georgiana Spinner, M.D.     GMO/MEDQ  D:  07/14/2007  T:  07/14/2007  Job:  [id number]

## 2010-06-05 NOTE — Op Note (Signed)
[name], [name]              ACCOUNT NO.:  [id number]   MEDICAL RECORD NO.:  [id number]          PATIENT TYPE:  OIB   LOCATION:  1538                         FACILITY:  [facility]   PHYSICIAN:  Malachi Pro. Ambrose Mantle, M.D. DATE OF BIRTH:  10/23/1935   DATE OF PROCEDURE:  11/03/2006  DATE OF DISCHARGE:                               OPERATIVE REPORT   PREOPERATIVE DIAGNOSES:  1. Cystocele.  2. Rectocele.  3. Uterine prolapse.  4. Stress urinary incontinence.  5. Possible lichen sclerosis.   POSTOPERATIVE DIAGNOSES:  1. Cystocele.  2. Rectocele.  3. Uterine prolapse.  4. Stress urinary incontinence.  5. Possible lichen sclerosis.   OPERATION:  1. Vaginal hysterectomy, anterior and posterior repair, by Malachi Pro.      Ambrose Mantle, MD, with Sherron Monday, MD, assisting.  2. Sling procedure by Veverly Fells. Vernie Ammons, MD.   The patient was brought to the operating room and placed under  satisfactory general anesthesia and placed in lithotomy position in the  [facility] stirrups.  Exam revealed thin mucosa on the perineum and the  clitoris was slightly hidden.  The uterus was posterior, normal size.  The adnexa were free of masses.  The cul-de-sac felt smooth.  The vulva,  vagina, perineum and urethra were prepped with Betadine solution.  A  Foley catheter was inserted to straight drain.  The cervix was drawn  into the operative field and a dilute solution of Neo-Synephrine was  injected at the cervicovaginal junction.  A circumferential incision was  made around the cervix.  The vaginal mucosa was undermined  circumferentially.  The posterior cul-de-sac was identified, entered  sharply, and the uterosacral ligaments were clamped, cut and suture  ligated and held.  The cardinal ligaments were then clamped, cut and  suture ligated.  The uterine vessels were clamped, cut and suture  ligated.  The bladder was kept well above the field.  I then entered the  anterior peritoneum, inverted the uterus through the  incision in the cul-  de-sac, clamped across both upper pedicles, doubly suture ligated the  right, triply suture ligated the left.  Hemostasis was adequate except  on the posterior cuff, and the posterior cuff was run with a running  locked suture of 0 Vicryl.  A pursestring suture was then placed around  the peritoneum, including the upper and lower pedicles.  I did improvise  a little bit because the suture was cut, so I did close the peritoneum  with this suture and tied it down.  The uterosacral ligaments were then  tied together in the midline with one tie of each suture on the  uterosacral ligaments and then an additional suture was used to suture  the uterosacral ligaments together.  I undermined the anterior vaginal  mucosa all the way to the urethra, developed the cystocele, imbricated  it with interrupted sutures of 0 Vicryl, cut away redundant vaginal  mucosa, and then closed the lower part of the anterior vaginal mucosa  with interrupted figure-of-eight sutures of 0 Vicryl.  I left the part  mirrors the urethra open for Dr. Vernie Ammons to work.  I then undermined the  posterior vaginal mucosa all the way to the vaginal cuff.  I developed  the rectocele, imbricated it with interrupted sutures of 0 Vicryl, cut  away redundant vaginal mucosa, and closed the vaginal mucosa in the  midline with interrupted figure-of-eight sutures of 0 Vicryl down to the  introitus and then closed the peritoneum, reapproximated the perineum  with interrupted 3-0 Vicryl sutures.  I then looked for hemostasis,  found no significant bleeding.  Dr. Vernie Ammons entered theater and was  ready to perform the sling procedure.  Blood loss for this part of the  procedure was estimated at 150 mL.  I should mention that I did a rectal  exam prior to closing the posterior vaginal mucosa and found no sutures  in the rectum and no injury to the rectum.      Malachi Pro. Ambrose Mantle, M.D.  Electronically Signed      TFH/MEDQ  D:  11/03/2006  T:  11/04/2006  Job:  [id number]

## 2010-06-05 NOTE — H&P (Signed)
[name], [name]              ACCOUNT NO.:  [id number]   MEDICAL RECORD NO.:  [id number]          PATIENT TYPE:  AMB   LOCATION:  DAY                          FACILITY:  [facility]   PHYSICIAN:  Malachi Pro. Ambrose Mantle, M.D. DATE OF BIRTH:  1935-11-13   DATE OF ADMISSION:  11/03/2006  DATE OF DISCHARGE:                              HISTORY & PHYSICAL   PRESENT ILLNESS:  This is a 75 year old white female para 2-0-0-3 who is  admitted to the hospital for vaginal hysterectomy, A&P repair, and sling  procedure.  Dr. Ambrose Mantle will do the vaginal hysterectomy/A&P repair, and  Dr. Vernie Ammons the sling.  The patient's last period was years ago,  probably in the mid 1990s.  She has had no postmenopausal bleeding.  She  has been known to have a cystocele, rectocele, and uterine prolapse for  several years.  In August 2008 she came in wanting to evaluate the  prolapse and discuss surgery.  She had some stress urinary incontinence  although she also has urgency incontinence for which she has taken  Detrol for some time.  She felt pressure in her lower abdomen when she  was on her feet or straining and she wanted it corrected.  She was  advised to see Dr. Vernie Ammons to see if a sling procedure should be done at  the time of the vaginal hysterectomy/A&P repair.  He evaluated her  thoroughly with urodynamics and felt that she would benefit from a sling  procedure.   PAST MEDICAL HISTORY:  Reveals allergy to SULFA, no latex allergies.   OPERATIONS:  1. D&C.  2. Laparoscopy.  3. Right modified radical mastectomy.  4. Tonsillectomy.  5. TRAM procedure for reconstruction of the right breast.   ILLNESSES:  1. Breast cancer.  2. High cholesterol.  3. High blood pressure.   REVIEW OF SYSTEMS:  No heart problems.  No visual problems.  No  headaches.  No bowel problems.  The patient does report, as stated in  the present illness, stress and urgency urinary incontinence.  Also,  pressure in her lower abdomen  standing on her feet.   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   She does not smoke tobacco.  She occasionally uses alcohol.   Her parents:  Her father died at 40 with a massive heart attack.  Mother  died at 23, congestive heart failure.  Siblings:  A 5 year old healthy  sister, a 80 year old healthy sister, a 47 year old brother died with  multiple issues, a 51 year old brother died with cancer.  Her children:  A 73 year old female who is healthy, a 73 year old female healthy - they  were twins, a 70 year old son is healthy.   MEDICATIONS:  Crestor, Micardis, Actonel, fish oil, and vitamins.   PHYSICAL EXAMINATION:  Blood pressure is 128/80, pulse is 80.  Weight is  182 pounds.  HEAD, EYES, EARS, NOSE AND THROAT:  No cranial abnormalities.  Extraocular movements are intact.  Nose and pharynx are clear.  NECK:  Supple without thyromegaly.  HEART:  Normal size and sounds.  No murmurs.  LUNGS:  Clear to auscultation.  The left breast is normal sitting and standing, no masses are palpable.  Right breast has a TRAM flap.  No masses are palpable there.  ABDOMEN:  Soft, nontender.  No masses are palpable.  There is scarring  in the lower abdomen transversely from the TRAM flap procedure.  Liver,  spleen and kidneys are not felt.  The vulva is remarkable for some irritation in the lower vulva, the  perineum and perianal.  The clitoris is not seen well.  The vagina is  clean.  There is moderate prolapse of the cervix as well as the bladder  and rectum.  The cervix is clean.  The uterus is posterior, deviated  slightly to the left.  The adnexa are free of masses and rectal exam is  negative.   ADMITTING IMPRESSION:  1. Uterovaginal prolapse with cystocele, rectocele, stress urinary      incontinence.  2. Hypertension.  3. Hyperlipidemia.  4. History of right breast cancer.   The patient is admitted and is prepared for vaginal hysterectomy, A&P  repair,  and sling procedure.  She has been informed of the risks of  surgery including but not limited to heart attack, stroke, pulmonary  embolus, wound disruption, hemorrhage with need for reoperation and/or  transfusion, fistula formation, nerve injury, intestinal obstruction.  She has also been informed that the surgery could shorten and narrow her  vagina that would make sexual intercourse even more difficult.  At the  present time she is sexually active about once per month.  She  understands and agrees to proceed with surgery.      Malachi Pro. Ambrose Mantle, M.D.  Electronically Signed     TFH/MEDQ  D:  11/02/2006  T:  11/03/2006  Job:  [id number]   cc:   Veverly Fells. Vernie Ammons, M.D.  Fax: [phone]

## 2010-06-08 NOTE — Op Note (Signed)
NAMEGAILYA, [name]                          ACCOUNT NO.:  [id number]   MEDICAL RECORD NO.:  [id number]                   PATIENT TYPE:  AMB   LOCATION:  DSC                                  FACILITY:  [facility]   PHYSICIAN:  Etter Sjogren, M.D.                  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   OPERATION PERFORMED:  Bilateral upper lid blepharoplasty.   SURGEON:  Etter Sjogren, M.D.   ANESTHESIA:  MAC with 1% Xylocaine with epinephrine plus bicarb.   INDICATIONS FOR PROCEDURE:  The patient is a 75 year old woman who was  referred by her ophthalmologist for a blepharoplasty.  She has had visual  field testing after complaint of upper field obstruction.  Visual field  testing confirmed that she has obstruction of her upper visual fields and  her ophthalmologist recommended blepharoplasty.  On examination, it was  indeed felt that it was due to excess skin in the upper lids.  There was no  true ptosis identified.  The nature of the procedure and the risks and  possible complications were discussed with her in great detail and she  understood all of this and the informed consent form was discussed line by  line in great detail and she wished to proceed.   DESCRIPTION OF PROCEDURE:  The patient was marked in a full upright position  in the holding area for the blepharoplasty with her eyes closed to ensure  that there would not be removal of too much skin.  She was then taken to the  operating room and placed supine. After satisfactory sedation, she was  prepped with Betadine and draped with sterile drapes.  The markings were  rechecked using a caliper to judge the lower incision in the eyelid crease  and this was 7 mm above lid margin.  The upper planned excision was also  confirmed using a forcep.  Satisfactory local anesthesia was achieved, the  skin excision was performed.  A thin strip of orbicularis oculi muscle was  removed along the superior border of the wound taking great care to avoid  dissection.  Meticulous hemostasis with the electrocautery.  Thorough  irrigation with saline and again, meticulous hemostasis with electrocautery.  After excellent hemostasis, the closure with 6-0 Prolene running  subcuticular suture.  Steri-Strips applied. The patient tolerated the  procedure well.   DISPOSITION:  Use iced eye pads every hour for 15 minutes at a time today  and tomorrow.  Expect some swelling and drainage.  No lifting or exercising.  She is to keep her head elevated.  We will see her in the office next week  as scheduled.  Etter Sjogren, M.D.    DB/MEDQ  D:  05/10/2003  T:  05/11/2003  Job:  [id number]

## 2010-06-08 NOTE — Op Note (Signed)
   [name], [name]                          ACCOUNT NO.:  [id number]   MEDICAL RECORD NO.:  [id number]                   PATIENT TYPE:  AMB   LOCATION:  ENDO                                 FACILITY:  [facility]   PHYSICIAN:  Georgiana Spinner, M.D.                 DATE OF BIRTH:  [birth year]   DATE OF PROCEDURE:  11/03/2002  DATE OF DISCHARGE:                                 OPERATIVE REPORT   PROCEDURE:  Colonoscopy.   INDICATIONS:  Colon cancer screening.   ANESTHESIA:  1. Demerol 60 mg.  2. Versed 6 mg.   DESCRIPTION OF PROCEDURE:  With the patient mildly sedated in the left  lateral decubitus position, the Olympus videoscopic colonoscope was inserted  in the rectum and passed under direct vision to the cecum, identified by  ileocecal valve and appendiceal orifice, crow's foot of the cecum.  From  this point, the colonoscope was slowly withdrawn, taking circumferential  views of colonic mucosa, stopping only in the rectum which appeared normal  on direct and retroflexed view.  The endoscope was straightened and  withdrawn.  The patient's vital signs and pulse oximeter remained stable.  The patient tolerated the procedure well without apparent complications.   FINDINGS:  1. Occasional diverticulum.  2. Otherwise, an unremarkable examination.  3. Repeat in 5-10 years.                                               Georgiana Spinner, M.D.    GMO/MEDQ  D:  11/03/2002  T:  11/03/2002  Job:  [id number]

## 2010-06-27 ENCOUNTER — Encounter: Payer: Self-pay | Admitting: Cardiovascular Disease

## 2010-07-09 ENCOUNTER — Other Ambulatory Visit: Payer: Self-pay | Admitting: Obstetrics and Gynecology

## 2010-07-09 DIAGNOSIS — Z1231 Encounter for screening mammogram for malignant neoplasm of breast: Secondary | ICD-10-CM

## 2010-08-01 ENCOUNTER — Ambulatory Visit (INDEPENDENT_AMBULATORY_CARE_PROVIDER_SITE_OTHER): Payer: Medicare Other | Admitting: Cardiovascular Disease

## 2010-08-01 ENCOUNTER — Encounter: Payer: Self-pay | Admitting: Cardiovascular Disease

## 2010-08-01 DIAGNOSIS — R079 Chest pain, unspecified: Secondary | ICD-10-CM

## 2010-08-01 DIAGNOSIS — E785 Hyperlipidemia, unspecified: Secondary | ICD-10-CM

## 2010-08-01 DIAGNOSIS — I10 Essential (primary) hypertension: Secondary | ICD-10-CM

## 2010-08-01 NOTE — Assessment & Plan Note (Signed)
Cholesterol is at goal.  Continue current dose of statin and diet Rx.  No myalgias or side effects.  F/U  LFT's in 6 months. Lab Results  Component Value Date   LDLCALC  Value: 55        Total Cholesterol/HDL:CHD Risk Coronary Heart Disease Risk Table                     Men   Women  1/2 Average Risk   3.4   3.3  Average Risk       5.0   4.4  2 X Average Risk   9.6   7.1  3 X Average Risk  23.4   11.0        Use the calculated Patient Ratio above and the CHD Risk Table to determine the patient's CHD Risk.        ATP III CLASSIFICATION (LDL):  <100     mg/dL   Optimal  100-129  mg/dL   Near or Above                    Optimal  130-159  mg/dL   Borderline  160-189  mg/dL   High  >190     mg/dL   Very High 07/10/2009             

## 2010-08-01 NOTE — Progress Notes (Signed)
this is a 75 year old white female patient who was hospitalized for community-acquired pneumonia 219 2011. She was having some sharp shooting chest pain and left arm tingling. She ruled out for an MI. Echocardiogram showed LVH with normal LV function ejection fraction 60%. She was scheduled for a stress Myoview as an outpatient which was performed July 18, 2009. This showed normal LV function, normal wall motion, no ischemia, there was brief SVT with stress but no scar or ischemia. She currently has no complaints. I think that the multiple previous cardiac referrals from Dr Renne Crigler have mostly been because of a positive family history in both mother and father.  Recendt cataract surgery with allegy to drops and continued erythema and pruritis that she has F/U with [facility]  ROS: Denies fever, malais, weight loss, blurry vision, decreased visual acuity, cough, sputum, SOB, hemoptysis, pleuritic pain, palpitaitons, heartburn, abdominal pain, melena, lower extremity edema, claudication, or rash.  All other systems reviewed and negative  General: Affect appropriate Healthy:  appears stated age HEENT: normal red eyes from allergy and cataract surgery Neck supple with no adenopathy JVP normal no bruits no thyromegaly Lungs clear with no wheezing and good diaphragmatic motion Heart:  S1/S2 no murmur,rub, gallop or click PMI normal Abdomen: benighn, BS positve, no tenderness, no AAA no bruit.  No HSM or HJR Distal pulses intact with no bruits No edema Neuro non-focal Skin warm and dry No muscular weakness   Current Outpatient Prescriptions  Medication Sig Dispense Refill  . aspirin 81 MG tablet Take 81 mg by mouth daily.        . Calcium Carbonate (CALCIUM 600) 1500 MG TABS Take 2 tablets by mouth daily.        Marland Kitchen doxycycline (DORYX) 100 MG EC tablet Take 100 mg by mouth as needed.        . Estradiol (VAGIFEM) 10 MCG TABS Place 1 tablet vaginally 3 (three) times daily.        . Omega-3 Fatty Acids  (FISH OIL) 1000 MG CAPS Take 4 capsules by mouth daily.        . rosuvastatin (CRESTOR) 10 MG tablet Take 10 mg by mouth daily.        Marland Kitchen telmisartan (MICARDIS) 40 MG tablet Take 40 mg by mouth daily.          Allergies  Sulfonamide derivatives  Electrocardiogram: 07/10/09 NSR 66 normal ECG  Assessment and Plan

## 2010-08-01 NOTE — Patient Instructions (Signed)
Your physician wants you to follow-up in: 1 year  You will receive a reminder letter in the mail two months in advance. If you don't receive a letter, please call our office to schedule the follow-up appointment.  Your physician recommends that you continue on your current medications as directed. Please refer to the Current Medication list given to you today.  

## 2010-08-01 NOTE — Assessment & Plan Note (Signed)
Well controlled.  Continue current medications and low sodium Dash type diet.    

## 2010-08-01 NOTE — Assessment & Plan Note (Signed)
Resolved with normal myovue 2011

## 2010-08-14 ENCOUNTER — Ambulatory Visit
Admission: RE | Admit: 2010-08-14 | Discharge: 2010-08-14 | Disposition: A | Discharge status: Home / Self Care | Payer: MEDICARE | Source: Ambulatory Visit | Attending: Obstetrics and Gynecology | Admitting: Obstetrics and Gynecology

## 2010-08-14 DIAGNOSIS — Z1231 Encounter for screening mammogram for malignant neoplasm of breast: Secondary | ICD-10-CM

## 2010-08-23 ENCOUNTER — Other Ambulatory Visit: Payer: Self-pay | Admitting: Internal Medicine

## 2010-08-23 DIAGNOSIS — E041 Nontoxic single thyroid nodule: Secondary | ICD-10-CM

## 2010-08-27 ENCOUNTER — Ambulatory Visit
Admission: RE | Admit: 2010-08-27 | Discharge: 2010-08-27 | Disposition: A | Discharge status: Home / Self Care | Payer: Medicare Other | Source: Ambulatory Visit | Attending: Internal Medicine | Admitting: Internal Medicine

## 2010-08-27 DIAGNOSIS — E041 Nontoxic single thyroid nodule: Secondary | ICD-10-CM

## 2010-11-01 LAB — HEMOGLOBIN AND HEMATOCRIT, BLOOD: HCT: 39

## 2010-11-01 LAB — CBC
HCT: 40.7
Hemoglobin: 14.1
MCHC: 34.7
MCV: 89
RBC: 4.58
WBC: 5.1

## 2010-11-01 LAB — DIFFERENTIAL
Eosinophils Relative: 1
Lymphocytes Relative: 37
Lymphs Abs: 1.9
Monocytes Absolute: 0.4

## 2010-11-01 LAB — COMPREHENSIVE METABOLIC PANEL
AST: 20
Albumin: 3.7
Calcium: 9.1
Chloride: 106
Creatinine, Ser: 0.68
GFR calc Af Amer: >60
Sodium: 138

## 2011-02-20 DIAGNOSIS — Z79899 Other long term (current) drug therapy: Secondary | ICD-10-CM | POA: Diagnosis not present

## 2011-02-20 DIAGNOSIS — E7800 Pure hypercholesterolemia, unspecified: Secondary | ICD-10-CM | POA: Diagnosis not present

## 2011-03-11 DIAGNOSIS — H10439 Chronic follicular conjunctivitis, unspecified eye: Secondary | ICD-10-CM | POA: Diagnosis not present

## 2011-04-16 DIAGNOSIS — IMO0002 Reserved for concepts with insufficient information to code with codable children: Secondary | ICD-10-CM | POA: Diagnosis not present

## 2011-04-16 DIAGNOSIS — Z0000 Encounter for general adult medical examination without abnormal findings: Secondary | ICD-10-CM | POA: Diagnosis not present

## 2011-04-16 DIAGNOSIS — L940 Localized scleroderma [morphea]: Secondary | ICD-10-CM | POA: Diagnosis not present

## 2011-04-16 DIAGNOSIS — Z79890 Hormone replacement therapy (postmenopausal): Secondary | ICD-10-CM | POA: Diagnosis not present

## 2011-04-16 DIAGNOSIS — Z124 Encounter for screening for malignant neoplasm of cervix: Secondary | ICD-10-CM | POA: Diagnosis not present

## 2011-04-30 DIAGNOSIS — IMO0002 Reserved for concepts with insufficient information to code with codable children: Secondary | ICD-10-CM | POA: Diagnosis not present

## 2011-07-09 ENCOUNTER — Other Ambulatory Visit: Payer: Self-pay | Admitting: Obstetrics and Gynecology

## 2011-07-09 DIAGNOSIS — Z1231 Encounter for screening mammogram for malignant neoplasm of breast: Secondary | ICD-10-CM

## 2011-07-09 DIAGNOSIS — Z853 Personal history of malignant neoplasm of breast: Secondary | ICD-10-CM

## 2011-08-19 ENCOUNTER — Ambulatory Visit
Admission: RE | Admit: 2011-08-19 | Discharge: 2011-08-19 | Disposition: A | Discharge status: Home / Self Care | Payer: Medicare Other | Source: Ambulatory Visit | Attending: Obstetrics and Gynecology | Admitting: Obstetrics and Gynecology

## 2011-08-19 DIAGNOSIS — Z1231 Encounter for screening mammogram for malignant neoplasm of breast: Secondary | ICD-10-CM

## 2011-08-19 DIAGNOSIS — Z853 Personal history of malignant neoplasm of breast: Secondary | ICD-10-CM

## 2011-08-21 ENCOUNTER — Ambulatory Visit: Payer: Medicare Other | Admitting: Cardiovascular Disease

## 2011-08-21 DIAGNOSIS — Z79899 Other long term (current) drug therapy: Secondary | ICD-10-CM | POA: Diagnosis not present

## 2011-08-21 DIAGNOSIS — E7800 Pure hypercholesterolemia, unspecified: Secondary | ICD-10-CM | POA: Diagnosis not present

## 2011-08-21 DIAGNOSIS — Z0000 Encounter for general adult medical examination without abnormal findings: Secondary | ICD-10-CM | POA: Diagnosis not present

## 2011-08-27 ENCOUNTER — Ambulatory Visit (INDEPENDENT_AMBULATORY_CARE_PROVIDER_SITE_OTHER): Payer: Medicare Other | Admitting: Cardiovascular Disease

## 2011-08-27 ENCOUNTER — Encounter: Payer: Self-pay | Admitting: Cardiovascular Disease

## 2011-08-27 VITALS — BP 162/84 | HR 75 | Ht 65.0 in | Wt 183.0 lb

## 2011-08-27 DIAGNOSIS — E785 Hyperlipidemia, unspecified: Secondary | ICD-10-CM

## 2011-08-27 DIAGNOSIS — R079 Chest pain, unspecified: Secondary | ICD-10-CM | POA: Diagnosis not present

## 2011-08-27 DIAGNOSIS — I10 Essential (primary) hypertension: Secondary | ICD-10-CM | POA: Diagnosis not present

## 2011-08-27 DIAGNOSIS — I440 Atrioventricular block, first degree: Secondary | ICD-10-CM

## 2011-08-27 NOTE — Assessment & Plan Note (Signed)
Nonrecurrent normal myovue in past.  Observe

## 2011-08-27 NOTE — Patient Instructions (Signed)
Your physician wants you to follow-up in: YEAR WITH DR NISHAN  You will receive a reminder letter in the mail two months in advance. If you don't receive a letter, please call our office to schedule the follow-up appointment.  Your physician recommends that you continue on your current medications as directed. Please refer to the Current Medication list given to you today. 

## 2011-08-27 NOTE — Assessment & Plan Note (Signed)
Well controlled.  Continue current medications and low sodium Dash type diet.    

## 2011-08-27 NOTE — Assessment & Plan Note (Signed)
No symptoms Yearly ECG no evidence of advance AV block

## 2011-08-27 NOTE — Progress Notes (Signed)
Patient ID: [name], female   DOB: 1935/03/21, 76 y.o.   MRN: [id number] This is a 76 year old white female patient who was hospitalized for community-acquired pneumonia 219 2011. She was having some sharp shooting chest pain and left arm tingling. She ruled out for an MI. Echocardiogram showed LVH with normal LV function ejection fraction 60%. She was scheduled for a stress Myoview as an outpatient which was performed July 18, 2009. This showed normal LV function, normal wall motion, no ischemia, there was brief SVT with stress but no scar or ischemia. She currently has no complaints. I think that the multiple previous cardiac referrals from Dr Renne Crigler have mostly been because of a positive family history in both mother and father.  Husband sees me and needs F/U.  No new issues since last visit Seeing Dr Renne Crigler for lab work and physical next week  ROS: Denies fever, malais, weight loss, blurry vision, decreased visual acuity, cough, sputum, SOB, hemoptysis, pleuritic pain, palpitaitons, heartburn, abdominal pain, melena, lower extremity edema, claudication, or rash.  All other systems reviewed and negative  General: Affect appropriate Healthy:  appears stated age HEENT: normal Neck supple with no adenopathy JVP normal no bruits no thyromegaly Lungs clear with no wheezing and good diaphragmatic motion Heart:  S1/S2 no murmur, no rub, gallop or click PMI normal Abdomen: benighn, BS positve, no tenderness, no AAA no bruit.  No HSM or HJR Distal pulses intact with no bruits No edema Neuro non-focal Skin warm and dry No muscular weakness   Current Outpatient Prescriptions  Medication Sig Dispense Refill  . aspirin 81 MG tablet Take 81 mg by mouth daily.        . Calcium Carbonate (CALCIUM 600) 1500 MG TABS Take 2 tablets by mouth daily.        Marland Kitchen doxycycline (DORYX) 100 MG EC tablet Take 100 mg by mouth as needed.        . Estradiol (VAGIFEM) 10 MCG TABS Take 1 tablet by mouth 3 (three)  times a week.       . Omega-3 Fatty Acids (FISH OIL) 1000 MG CAPS Take 4 capsules by mouth daily.        . rosuvastatin (CRESTOR) 10 MG tablet Take 10 mg by mouth daily.        Marland Kitchen telmisartan (MICARDIS) 40 MG tablet Take 40 mg by mouth daily.          Allergies  Sulfonamide derivatives  Electrocardiogram:  NSR rate 75 PR 218 PVC nonspecific St/T wave changes  Assessment and Plan

## 2011-08-27 NOTE — Assessment & Plan Note (Signed)
Cholesterol is at goal.  Continue current dose of statin and diet Rx.  No myalgias or side effects.  F/U  LFT's in 6 months. Lab Results  Component Value Date   LDLCALC  Value: 55        Total Cholesterol/HDL:CHD Risk Coronary Heart Disease Risk Table                     Men   Women  1/2 Average Risk   3.4   3.3  Average Risk       5.0   4.4  2 X Average Risk   9.6   7.1  3 X Average Risk  23.4   11.0        Use the calculated Patient Ratio above and the CHD Risk Table to determine the patient's CHD Risk.        ATP III CLASSIFICATION (LDL):  <100     mg/dL   Optimal  100-129  mg/dL   Near or Above                    Optimal  130-159  mg/dL   Borderline  160-189  mg/dL   High  >190     mg/dL   Very High 07/10/2009             

## 2011-08-28 DIAGNOSIS — E7800 Pure hypercholesterolemia, unspecified: Secondary | ICD-10-CM | POA: Diagnosis not present

## 2011-08-28 DIAGNOSIS — E669 Obesity, unspecified: Secondary | ICD-10-CM | POA: Diagnosis not present

## 2011-08-28 DIAGNOSIS — Z0000 Encounter for general adult medical examination without abnormal findings: Secondary | ICD-10-CM | POA: Diagnosis not present

## 2011-08-28 DIAGNOSIS — I8390 Asymptomatic varicose veins of unspecified lower extremity: Secondary | ICD-10-CM | POA: Diagnosis not present

## 2011-08-28 DIAGNOSIS — I10 Essential (primary) hypertension: Secondary | ICD-10-CM | POA: Diagnosis not present

## 2011-09-11 DIAGNOSIS — M810 Age-related osteoporosis without current pathological fracture: Secondary | ICD-10-CM | POA: Diagnosis not present

## 2011-10-09 DIAGNOSIS — M810 Age-related osteoporosis without current pathological fracture: Secondary | ICD-10-CM | POA: Diagnosis not present

## 2011-10-09 DIAGNOSIS — Z23 Encounter for immunization: Secondary | ICD-10-CM | POA: Diagnosis not present

## 2011-10-15 DIAGNOSIS — M810 Age-related osteoporosis without current pathological fracture: Secondary | ICD-10-CM | POA: Diagnosis not present

## 2011-11-04 ENCOUNTER — Encounter (HOSPITAL_COMMUNITY): Payer: Self-pay

## 2011-11-04 ENCOUNTER — Encounter (HOSPITAL_COMMUNITY)
Admission: RE | Admit: 2011-11-04 | Discharge: 2011-11-04 | Disposition: A | Discharge status: Home / Self Care | Payer: Medicare Other | Source: Ambulatory Visit | Attending: Internal Medicine | Admitting: Internal Medicine

## 2011-11-04 DIAGNOSIS — M810 Age-related osteoporosis without current pathological fracture: Secondary | ICD-10-CM | POA: Insufficient documentation

## 2011-11-04 MED ORDER — DENOSUMAB 60 MG/ML ~~LOC~~ SOLN
60.0000 mg | Freq: Once | SUBCUTANEOUS | Status: AC
  Administered 2011-11-04: 60 mg via SUBCUTANEOUS
  Filled 2011-11-04: qty 1

## 2011-11-05 DIAGNOSIS — L940 Localized scleroderma [morphea]: Secondary | ICD-10-CM | POA: Diagnosis not present

## 2012-03-07 ENCOUNTER — Other Ambulatory Visit: Payer: Self-pay

## 2012-03-31 DIAGNOSIS — L821 Other seborrheic keratosis: Secondary | ICD-10-CM | POA: Diagnosis not present

## 2012-03-31 DIAGNOSIS — L819 Disorder of pigmentation, unspecified: Secondary | ICD-10-CM | POA: Diagnosis not present

## 2012-03-31 DIAGNOSIS — D1801 Hemangioma of skin and subcutaneous tissue: Secondary | ICD-10-CM | POA: Diagnosis not present

## 2012-04-08 DIAGNOSIS — H04129 Dry eye syndrome of unspecified lacrimal gland: Secondary | ICD-10-CM | POA: Diagnosis not present

## 2012-04-21 DIAGNOSIS — IMO0002 Reserved for concepts with insufficient information to code with codable children: Secondary | ICD-10-CM | POA: Diagnosis not present

## 2012-04-21 DIAGNOSIS — Z0000 Encounter for general adult medical examination without abnormal findings: Secondary | ICD-10-CM | POA: Diagnosis not present

## 2012-04-21 DIAGNOSIS — Z01419 Encounter for gynecological examination (general) (routine) without abnormal findings: Secondary | ICD-10-CM | POA: Diagnosis not present

## 2012-05-01 ENCOUNTER — Other Ambulatory Visit (HOSPITAL_COMMUNITY): Payer: Self-pay | Admitting: Internal Medicine

## 2012-05-04 ENCOUNTER — Encounter (HOSPITAL_COMMUNITY): Payer: Self-pay

## 2012-05-04 ENCOUNTER — Ambulatory Visit (HOSPITAL_COMMUNITY)
Admission: RE | Admit: 2012-05-04 | Discharge: 2012-05-04 | Disposition: A | Discharge status: Home / Self Care | Payer: Medicare Other | Source: Ambulatory Visit | Attending: Internal Medicine | Admitting: Internal Medicine

## 2012-05-04 DIAGNOSIS — M810 Age-related osteoporosis without current pathological fracture: Secondary | ICD-10-CM | POA: Diagnosis not present

## 2012-05-04 LAB — MAGNESIUM: Magnesium: 2 mg/dL (ref 1.5–2.5)

## 2012-05-04 LAB — BASIC METABOLIC PANEL
CO2: 28 mEq/L (ref 19–32)
Chloride: 101 mEq/L (ref 96–112)
Potassium: 3.6 mEq/L (ref 3.5–5.1)
Sodium: 140 mEq/L (ref 135–145)

## 2012-05-04 LAB — PHOSPHORUS: Phosphorus: 3.8 mg/dL (ref 2.3–4.6)

## 2012-05-04 MED ORDER — DENOSUMAB 60 MG/ML ~~LOC~~ SOLN
60.0000 mg | Freq: Once | SUBCUTANEOUS | Status: AC
  Administered 2012-05-04: 60 mg via SUBCUTANEOUS
  Filled 2012-05-04: qty 1

## 2012-05-15 DIAGNOSIS — R1110 Vomiting, unspecified: Secondary | ICD-10-CM | POA: Diagnosis not present

## 2012-05-15 DIAGNOSIS — R197 Diarrhea, unspecified: Secondary | ICD-10-CM | POA: Diagnosis not present

## 2012-05-15 DIAGNOSIS — R0789 Other chest pain: Secondary | ICD-10-CM | POA: Diagnosis not present

## 2012-05-15 DIAGNOSIS — N390 Urinary tract infection, site not specified: Secondary | ICD-10-CM | POA: Diagnosis not present

## 2012-05-21 DIAGNOSIS — R002 Palpitations: Secondary | ICD-10-CM | POA: Diagnosis not present

## 2012-05-21 DIAGNOSIS — R197 Diarrhea, unspecified: Secondary | ICD-10-CM | POA: Diagnosis not present

## 2012-05-21 DIAGNOSIS — R5381 Other malaise: Secondary | ICD-10-CM | POA: Diagnosis not present

## 2012-05-21 DIAGNOSIS — R5383 Other fatigue: Secondary | ICD-10-CM | POA: Diagnosis not present

## 2012-05-21 DIAGNOSIS — N390 Urinary tract infection, site not specified: Secondary | ICD-10-CM | POA: Diagnosis not present

## 2012-05-28 ENCOUNTER — Ambulatory Visit: Payer: Medicare Other | Admitting: Physician Assistant

## 2012-07-13 ENCOUNTER — Other Ambulatory Visit: Payer: Self-pay

## 2012-07-13 DIAGNOSIS — Z1231 Encounter for screening mammogram for malignant neoplasm of breast: Secondary | ICD-10-CM

## 2012-07-13 DIAGNOSIS — Z853 Personal history of malignant neoplasm of breast: Secondary | ICD-10-CM

## 2012-07-13 DIAGNOSIS — Z9011 Acquired absence of right breast and nipple: Secondary | ICD-10-CM

## 2012-07-14 ENCOUNTER — Ambulatory Visit: Payer: Medicare Other | Admitting: Cardiovascular Disease

## 2012-08-17 DIAGNOSIS — I10 Essential (primary) hypertension: Secondary | ICD-10-CM | POA: Diagnosis not present

## 2012-08-17 DIAGNOSIS — M810 Age-related osteoporosis without current pathological fracture: Secondary | ICD-10-CM | POA: Diagnosis not present

## 2012-08-17 DIAGNOSIS — E7800 Pure hypercholesterolemia, unspecified: Secondary | ICD-10-CM | POA: Diagnosis not present

## 2012-08-18 DIAGNOSIS — I10 Essential (primary) hypertension: Secondary | ICD-10-CM | POA: Diagnosis not present

## 2012-08-18 DIAGNOSIS — E7800 Pure hypercholesterolemia, unspecified: Secondary | ICD-10-CM | POA: Diagnosis not present

## 2012-08-18 DIAGNOSIS — M810 Age-related osteoporosis without current pathological fracture: Secondary | ICD-10-CM | POA: Diagnosis not present

## 2012-08-26 ENCOUNTER — Ambulatory Visit
Admission: RE | Admit: 2012-08-26 | Discharge: 2012-08-26 | Disposition: A | Discharge status: Home / Self Care | Payer: Medicare Other | Source: Ambulatory Visit

## 2012-08-26 ENCOUNTER — Other Ambulatory Visit: Payer: Self-pay

## 2012-08-26 DIAGNOSIS — Z853 Personal history of malignant neoplasm of breast: Secondary | ICD-10-CM

## 2012-08-26 DIAGNOSIS — Z1231 Encounter for screening mammogram for malignant neoplasm of breast: Secondary | ICD-10-CM

## 2012-08-26 DIAGNOSIS — Z9011 Acquired absence of right breast and nipple: Secondary | ICD-10-CM

## 2012-08-28 DIAGNOSIS — E7800 Pure hypercholesterolemia, unspecified: Secondary | ICD-10-CM | POA: Diagnosis not present

## 2012-08-28 DIAGNOSIS — I10 Essential (primary) hypertension: Secondary | ICD-10-CM | POA: Diagnosis not present

## 2012-08-28 DIAGNOSIS — Z0000 Encounter for general adult medical examination without abnormal findings: Secondary | ICD-10-CM | POA: Diagnosis not present

## 2012-08-28 DIAGNOSIS — Z79899 Other long term (current) drug therapy: Secondary | ICD-10-CM | POA: Diagnosis not present

## 2012-09-02 DIAGNOSIS — I10 Essential (primary) hypertension: Secondary | ICD-10-CM | POA: Diagnosis not present

## 2012-09-02 DIAGNOSIS — N3289 Other specified disorders of bladder: Secondary | ICD-10-CM | POA: Diagnosis not present

## 2012-09-02 DIAGNOSIS — M810 Age-related osteoporosis without current pathological fracture: Secondary | ICD-10-CM | POA: Diagnosis not present

## 2012-09-02 DIAGNOSIS — E7800 Pure hypercholesterolemia, unspecified: Secondary | ICD-10-CM | POA: Diagnosis not present

## 2012-09-04 ENCOUNTER — Ambulatory Visit (INDEPENDENT_AMBULATORY_CARE_PROVIDER_SITE_OTHER): Payer: Medicare Other | Admitting: Cardiovascular Disease

## 2012-09-04 ENCOUNTER — Encounter: Payer: Self-pay | Admitting: Cardiovascular Disease

## 2012-09-04 VITALS — BP 144/82 | HR 78 | Ht 65.0 in | Wt 174.1 lb

## 2012-09-04 DIAGNOSIS — Z8249 Family history of ischemic heart disease and other diseases of the circulatory system: Secondary | ICD-10-CM | POA: Diagnosis not present

## 2012-09-04 DIAGNOSIS — I10 Essential (primary) hypertension: Secondary | ICD-10-CM | POA: Diagnosis not present

## 2012-09-04 DIAGNOSIS — R079 Chest pain, unspecified: Secondary | ICD-10-CM

## 2012-09-04 NOTE — Patient Instructions (Signed)
Your physician wants you to follow-up in:   YEARLY WITH DR Haywood Filler will receive a reminder letter in the mail two months in advance. If you don't receive a letter, please call our office to schedule the follow-up appointment. Your physician recommends that you continue on your current medications as directed. Please refer to the Current Medication list given to you today. CALCIUM SCORING  FAMILY HISTORY

## 2012-09-04 NOTE — Assessment & Plan Note (Signed)
Well controlled.  Continue current medications and low sodium Dash type diet.    

## 2012-09-04 NOTE — Assessment & Plan Note (Signed)
Cholesterol is at goal.  Continue current dose of statin and diet Rx.  No myalgias or side effects.  F/U  LFT's in 6 months. Lab Results  Component Value Date   LDLCALC  Value: 55        Total Cholesterol/HDL:CHD Risk Coronary Heart Disease Risk Table                     Men   Women  1/2 Average Risk   3.4   3.3  Average Risk       5.0   4.4  2 X Average Risk   9.6   7.1  3 X Average Risk  23.4   11.0        Use the calculated Patient Ratio above and the CHD Risk Table to determine the patient's CHD Risk.        ATP III CLASSIFICATION (LDL):  <100     mg/dL   Optimal  409-811  mg/dL   Near or Above                    Optimal  130-159  mg/dL   Borderline  914-782  mg/dL   High  >956     mg/dL   Very High 03/06/863

## 2012-09-04 NOTE — Assessment & Plan Note (Signed)
Atypical resolved  Normal ECG  Discussed risk stratification with patient due to family history She would like to proceed with coronar ycalcium score

## 2012-09-04 NOTE — Progress Notes (Signed)
Patient ID: [name], female   DOB: 09-04-1935, 77 y.o.   MRN: [id number] this is a 77 year old white female patient who was hospitalized for community-acquired pneumonia 219 2011. She was having some sharp shooting chest pain and left arm tingling. She ruled out for an MI. Echocardiogram showed LVH with normal LV function ejection fraction 60%. She was scheduled for a stress Myoview as an outpatient which was performed July 18, 2009. This showed normal LV function, normal wall motion, no ischemia, there was brief SVT with stress but no scar or ischemia. She currently has no complaints. I think that the multiple previous cardiac referrals from Dr Renne Crigler have mostly been because of a positive family history in both mother and father.  Had diarhea a couple months ago and heart raeced with atypical chest pain ECG was normal   ROS: Denies fever, malais, weight loss, blurry vision, decreased visual acuity, cough, sputum, SOB, hemoptysis, pleuritic pain, palpitaitons, heartburn, abdominal pain, melena, lower extremity edema, claudication, or rash.  All other systems reviewed and negative  General: Affect appropriate Healthy:  appears stated age HEENT: normal Neck supple with no adenopathy JVP normal no bruits no thyromegaly Lungs clear with no wheezing and good diaphragmatic motion Heart:  S1/S2 no murmur, no rub, gallop or click PMI normal Abdomen: benighn, BS positve, no tenderness, no AAA no bruit.  No HSM or HJR Distal pulses intact with no bruits No edema Neuro non-focal Skin warm and dry No muscular weakness   Current Outpatient Prescriptions  Medication Sig Dispense Refill  . aspirin 81 MG tablet Take 81 mg by mouth daily.        . Calcium Carbonate (CALCIUM 600) 1500 MG TABS Take 2 tablets by mouth daily.        . Calcium-Vitamin D (CALTRATE 600 PLUS-VIT D PO) Take 1 tablet by mouth 2 (two) times daily.      Marland Kitchen denosumab (PROLIA) 60 MG/ML SOLN injection Inject 60 mg into the skin  every 6 (six) months. Administer in upper arm, thigh, or abdomen      . Estradiol (VAGIFEM) 10 MCG TABS Place 1 tablet vaginally 3 (three) times a week.       . losartan (COZAAR) 100 MG tablet Take 100 mg by mouth daily.      . Omega-3 Fatty Acids (FISH OIL) 1000 MG CAPS Take 2 capsules by mouth daily.       . rosuvastatin (CRESTOR) 10 MG tablet Take 10 mg by mouth daily.         No current facility-administered medications for this visit.    Allergies  Sulfonamide derivatives  Electrocardiogram: 05/15/12 SR normal ECG rate 87  8/13  SR rate 66 normal ECG slightly flat ST segments   Assessment and Plan

## 2012-09-09 ENCOUNTER — Ambulatory Visit (INDEPENDENT_AMBULATORY_CARE_PROVIDER_SITE_OTHER)
Admission: RE | Admit: 2012-09-09 | Discharge: 2012-09-09 | Disposition: A | Discharge status: Home / Self Care | Payer: Medicare Other | Source: Ambulatory Visit | Attending: Cardiovascular Disease | Admitting: Cardiovascular Disease

## 2012-09-09 DIAGNOSIS — I10 Essential (primary) hypertension: Secondary | ICD-10-CM

## 2012-09-09 DIAGNOSIS — R079 Chest pain, unspecified: Secondary | ICD-10-CM

## 2012-09-09 DIAGNOSIS — Z8249 Family history of ischemic heart disease and other diseases of the circulatory system: Secondary | ICD-10-CM

## 2012-10-20 DIAGNOSIS — M810 Age-related osteoporosis without current pathological fracture: Secondary | ICD-10-CM | POA: Diagnosis not present

## 2012-10-26 DIAGNOSIS — M810 Age-related osteoporosis without current pathological fracture: Secondary | ICD-10-CM | POA: Diagnosis not present

## 2012-11-06 DIAGNOSIS — Z23 Encounter for immunization: Secondary | ICD-10-CM | POA: Diagnosis not present

## 2012-11-26 ENCOUNTER — Other Ambulatory Visit: Payer: Self-pay

## 2013-01-25 DIAGNOSIS — Z1211 Encounter for screening for malignant neoplasm of colon: Secondary | ICD-10-CM | POA: Diagnosis not present

## 2013-01-25 DIAGNOSIS — R151 Fecal smearing: Secondary | ICD-10-CM | POA: Diagnosis not present

## 2013-01-25 DIAGNOSIS — Z853 Personal history of malignant neoplasm of breast: Secondary | ICD-10-CM | POA: Diagnosis not present

## 2013-02-16 DIAGNOSIS — K649 Unspecified hemorrhoids: Secondary | ICD-10-CM | POA: Diagnosis not present

## 2013-02-16 DIAGNOSIS — D126 Benign neoplasm of colon, unspecified: Secondary | ICD-10-CM | POA: Diagnosis not present

## 2013-02-16 DIAGNOSIS — K5730 Diverticulosis of large intestine without perforation or abscess without bleeding: Secondary | ICD-10-CM | POA: Diagnosis not present

## 2013-02-16 DIAGNOSIS — Z1211 Encounter for screening for malignant neoplasm of colon: Secondary | ICD-10-CM | POA: Diagnosis not present

## 2013-03-15 ENCOUNTER — Telehealth: Payer: Self-pay | Admitting: *Deleted

## 2013-03-15 DIAGNOSIS — Z853 Personal history of malignant neoplasm of breast: Secondary | ICD-10-CM | POA: Diagnosis not present

## 2013-03-15 DIAGNOSIS — R151 Fecal smearing: Secondary | ICD-10-CM | POA: Diagnosis not present

## 2013-03-15 DIAGNOSIS — R911 Solitary pulmonary nodule: Secondary | ICD-10-CM

## 2013-03-15 NOTE — Telephone Encounter (Signed)
PT DUE IN  FEB FOR CHEST CT  WITHOUT CONTRAST  DX  F/U LING NODULE LEFT MESSAGE FOR  PT  TO CALL BACK  TO SHCEDULE./CY

## 2013-03-15 NOTE — Telephone Encounter (Signed)
PT AWARE  OF THE NEED TO  SCHEDULE  CHEST  CT CALL TRANSFERRED TO CT  DEPARTMENT./CY

## 2013-03-19 ENCOUNTER — Ambulatory Visit (INDEPENDENT_AMBULATORY_CARE_PROVIDER_SITE_OTHER)
Admission: RE | Admit: 2013-03-19 | Discharge: 2013-03-19 | Disposition: A | Discharge status: Home / Self Care | Payer: Medicare Other | Source: Ambulatory Visit | Attending: Cardiovascular Disease | Admitting: Cardiovascular Disease

## 2013-03-19 DIAGNOSIS — J479 Bronchiectasis, uncomplicated: Secondary | ICD-10-CM | POA: Diagnosis not present

## 2013-03-19 DIAGNOSIS — R911 Solitary pulmonary nodule: Secondary | ICD-10-CM

## 2013-03-23 ENCOUNTER — Other Ambulatory Visit: Payer: Self-pay | Admitting: *Deleted

## 2013-03-23 ENCOUNTER — Telehealth: Payer: Self-pay | Admitting: Cardiovascular Disease

## 2013-03-23 DIAGNOSIS — J189 Pneumonia, unspecified organism: Secondary | ICD-10-CM

## 2013-03-23 NOTE — Telephone Encounter (Signed)
PT AWARE OF  CHEST  CT  RESULTS./CY 

## 2013-03-23 NOTE — Telephone Encounter (Signed)
Follow up    Pt called to get the results of her 2/27 CT test.  Please give pt a call back.  Try the home # first then her cell she would like to speak to you.

## 2013-04-07 ENCOUNTER — Ambulatory Visit (INDEPENDENT_AMBULATORY_CARE_PROVIDER_SITE_OTHER): Payer: Medicare Other | Admitting: Emergency Medicine

## 2013-04-07 ENCOUNTER — Encounter: Payer: Self-pay | Admitting: Emergency Medicine

## 2013-04-07 VITALS — BP 130/80 | HR 65 | Ht 65.5 in | Wt 182.6 lb

## 2013-04-07 DIAGNOSIS — R918 Other nonspecific abnormal finding of lung field: Secondary | ICD-10-CM | POA: Insufficient documentation

## 2013-04-07 NOTE — Assessment & Plan Note (Signed)
Small pulm nodules in a low risk patient. Suspect these are benign. Will need f/u for 2 yrs, next scan in Sept 2015. If unchanged then a final scan in 09/2014. She has some subtle RLL interstitial change without any sx. Will follow this for interval change as well.

## 2013-04-07 NOTE — Patient Instructions (Signed)
We will repeat your CT scan in 09/2013.  Follow with Dr Lamonte Sakai in September after the sacn

## 2013-04-07 NOTE — Telephone Encounter (Signed)
caltrate plus D is on medication list. Calcium has been taking off as duplicate.

## 2013-04-07 NOTE — Progress Notes (Signed)
Subjective:    Patient ID: [name], female    DOB: [birth year], 78 y.o.   MRN: [id number]  HPI 78 yo never smoker w hx breast CA s/p R mastectomy, HTN, hyperlipidemia. She underwent a cardiac scoring CT scan 09/14/12 that revealed two small pulm nodules > R minor fissure and the lingula. Denies any breathing sx, cough. No wheeze.    Review of Systems  Constitutional: Negative for fever and unexpected weight change.  HENT: Negative for congestion, dental problem, ear pain, nosebleeds, postnasal drip, rhinorrhea, sinus pressure, sneezing, sore throat and trouble swallowing.   Eyes: Negative for redness and itching.  Respiratory: Negative for cough, chest tightness, shortness of breath and wheezing.   Cardiovascular: Negative for palpitations and leg swelling.  Gastrointestinal: Negative for nausea and vomiting.  Genitourinary: Negative for dysuria.  Musculoskeletal: Positive for joint swelling.       Pain and stiffness   Skin: Negative for rash.  Neurological: Negative for headaches.  Hematological: Does not bruise/bleed easily.  Psychiatric/Behavioral: Negative for dysphoric mood. The patient is not nervous/anxious.     Past Medical History  Diagnosis Date  . Chest pain, unspecified   . Cardiomegaly     Normal echocardiogram and cardiac workup by Dr. Johnsie Cancel  . Hyperlipidemia   . Hypertension   . Polyp of gallbladder   . Thyroid nodule   . Irritable bladder   . Acne rosacea   . Osteopenia   . Lymphedema of arm     Right, post lumph node infection  . Breast cancer     Has had a prior right right mastectomy and chemotherapy     Family History  Problem Relation Age of Onset  . Heart attack Father 55  . Coronary artery disease       History   Social History  . Marital Status: Married    Spouse Name: N/A    Number of Children: N/A  . Years of Education: N/A   Occupational History  . Not on file.   Social History Main Topics  . Smoking status: Never Smoker    . Smokeless tobacco: Not on file  . Alcohol Use: Yes     Comment: occasionally  . Drug Use: Not on file  . Sexual Activity: Not on file   Other Topics Concern  . Not on file   Social History Narrative  . No narrative on file     Allergies  Allergen Reactions  . Sulfonamide Derivatives     REACTION: Anaphylactic Reaction     Outpatient Prescriptions Prior to Visit  Medication Sig Dispense Refill  . aspirin 81 MG tablet Take 81 mg by mouth daily.        . Calcium Carbonate (CALCIUM 600) 1500 MG TABS Take 2 tablets by mouth daily.        . Calcium-Vitamin D (CALTRATE 600 PLUS-VIT D PO) Take 1 tablet by mouth 2 (two) times daily.      Marland Kitchen denosumab (PROLIA) 60 MG/ML SOLN injection Inject 60 mg into the skin every 6 (six) months. Administer in upper arm, thigh, or abdomen      . Estradiol (VAGIFEM) 10 MCG TABS Place 1 tablet vaginally 3 (three) times a week.       . losartan (COZAAR) 100 MG tablet Take 100 mg by mouth daily.      . Omega-3 Fatty Acids (FISH OIL) 1000 MG CAPS Take 2 capsules by mouth daily.       . rosuvastatin (CRESTOR) 10  MG tablet Take 10 mg by mouth daily.         No facility-administered medications prior to visit.     CT scan 03/23/13 --  COMPARISON: CT CARDIAC SCORING dated 09/09/2012  FINDINGS:  No pathologically enlarged mediastinal or axillary lymph nodes.  Surgical clips are seen in the right axilla. Hilar regions are  difficult to definitively evaluate in the absence of IV contrast.  Coronary artery calcification. Heart is mildly enlarged. No  pericardial effusion. Surgical clips are seen along the inferior  right anterior chest wall. Probable small hiatal hernia. Calcified  lymph nodes are seen adjacent to the distal esophagus.  A smudgy nodule along the inferior margin of the minor fissure  measures 8 mm (7 x 8 mm), unchanged. Similarly, a 7 mm (5 x 8 mm)  nodule in the lingula (image 37) is unchanged. There is a basilar  predominant pattern of  patchy ground-glass, traction  bronchiolectasis and subpleural reticulation, traction  bronchiolectasis, ground-glass and mild architectural distortion,  unchanged from 09/09/2012. Mild subpleural reticulation. Findings do  not appear changed from 09/09/2012. Calcified subpleural granuloma  in the right lower lobe. No pleural fluid. Airway is unremarkable.  Incidental imaging of the upper abdomen shows the visualized portion  of the liver to be unremarkable. Tiny calcified gallstone.  Visualized portions of the adrenal glands, kidneys, spleen,  pancreas, stomach and bowel are otherwise grossly unremarkable.  Scattered metallic densities are seen along the ventral abdominal  wall. No upper abdominal adenopathy. No worrisome lytic or sclerotic  lesions. Degenerative changes are seen in the spine.  IMPRESSION:  1. Small nodules along the minor fissure and in the lingula are  unchanged from 09/09/2012. Additional followup in approximately 6  months could be performed to ensure continued stability, in this  patient with a known primary malignancy.  2. Basilar predominant pattern of subpleural reticulation, traction  bronchiolectasis, ground-glass and architectural distortion appears  unchanged from 09/09/2012. Interstitial lung disease such as  nonspecific interstitial pneumonitis (NSIP) or usual interstitial  pneumonitis (UIP) can have this appearance. Underlying etiology may  relate to the patient's treatment for breast cancer.  3. Coronary artery calcification.  4. Tiny gallstone.      Objective:   Physical Exam Filed Vitals:   04/07/13 1049  BP: 130/80  Pulse: 65  Height: 5' 5.5" (1.664 m)  Weight: 182 lb 9.6 oz (82.827 kg)  SpO2: 97%   Gen: Pleasant, well-nourished, in no distress,  normal affect  ENT: No lesions,  mouth clear,  oropharynx clear, no postnasal drip  Neck: No JVD, no TMG, no carotid bruits  Lungs: No use of accessory muscles, R basilar soft crackles,  otherwise clear  Cardiovascular: RRR, heart sounds normal, no murmur or gallops, no peripheral edema  Musculoskeletal: No deformities, no cyanosis or clubbing  Neuro: alert, non focal  Skin: Warm, no lesions or rashes     Assessment & Plan:  Pulmonary nodules Small pulm nodules in a low risk patient. Suspect these are benign. Will need f/u for 2 yrs, next scan in Sept 2015. If unchanged then a final scan in 09/2014. She has some subtle RLL interstitial change without any sx. Will follow this for interval change as well.

## 2013-04-12 DIAGNOSIS — H35359 Cystoid macular degeneration, unspecified eye: Secondary | ICD-10-CM | POA: Diagnosis not present

## 2013-04-12 DIAGNOSIS — H04129 Dry eye syndrome of unspecified lacrimal gland: Secondary | ICD-10-CM | POA: Diagnosis not present

## 2013-04-28 DIAGNOSIS — R7309 Other abnormal glucose: Secondary | ICD-10-CM | POA: Diagnosis not present

## 2013-05-12 DIAGNOSIS — M810 Age-related osteoporosis without current pathological fracture: Secondary | ICD-10-CM | POA: Diagnosis not present

## 2013-05-25 DIAGNOSIS — L940 Localized scleroderma [morphea]: Secondary | ICD-10-CM | POA: Diagnosis not present

## 2013-05-25 DIAGNOSIS — Z79890 Hormone replacement therapy (postmenopausal): Secondary | ICD-10-CM | POA: Diagnosis not present

## 2013-05-25 DIAGNOSIS — Z01419 Encounter for gynecological examination (general) (routine) without abnormal findings: Secondary | ICD-10-CM | POA: Diagnosis not present

## 2013-05-25 DIAGNOSIS — Z0000 Encounter for general adult medical examination without abnormal findings: Secondary | ICD-10-CM | POA: Diagnosis not present

## 2013-07-19 ENCOUNTER — Other Ambulatory Visit: Payer: Self-pay

## 2013-07-19 DIAGNOSIS — Z853 Personal history of malignant neoplasm of breast: Secondary | ICD-10-CM

## 2013-07-19 DIAGNOSIS — Z1231 Encounter for screening mammogram for malignant neoplasm of breast: Secondary | ICD-10-CM

## 2013-08-02 DIAGNOSIS — H04129 Dry eye syndrome of unspecified lacrimal gland: Secondary | ICD-10-CM | POA: Diagnosis not present

## 2013-08-30 ENCOUNTER — Ambulatory Visit
Admission: RE | Admit: 2013-08-30 | Discharge: 2013-08-30 | Disposition: A | Discharge status: Home / Self Care | Payer: Medicare Other | Source: Ambulatory Visit

## 2013-08-30 DIAGNOSIS — Z1231 Encounter for screening mammogram for malignant neoplasm of breast: Secondary | ICD-10-CM

## 2013-08-30 DIAGNOSIS — Z853 Personal history of malignant neoplasm of breast: Secondary | ICD-10-CM

## 2013-09-13 ENCOUNTER — Ambulatory Visit (INDEPENDENT_AMBULATORY_CARE_PROVIDER_SITE_OTHER): Payer: Medicare Other | Admitting: Cardiovascular Disease

## 2013-09-13 ENCOUNTER — Encounter: Payer: Self-pay | Admitting: Cardiovascular Disease

## 2013-09-13 VITALS — BP 130/80 | HR 69 | Resp 11 | Ht 65.0 in | Wt 179.8 lb

## 2013-09-13 DIAGNOSIS — I10 Essential (primary) hypertension: Secondary | ICD-10-CM

## 2013-09-13 DIAGNOSIS — R079 Chest pain, unspecified: Secondary | ICD-10-CM | POA: Diagnosis not present

## 2013-09-13 DIAGNOSIS — E785 Hyperlipidemia, unspecified: Secondary | ICD-10-CM | POA: Diagnosis not present

## 2013-09-13 DIAGNOSIS — I440 Atrioventricular block, first degree: Secondary | ICD-10-CM | POA: Diagnosis not present

## 2013-09-13 NOTE — Assessment & Plan Note (Signed)
No change in ECG Yearly repeat no high grade AV block

## 2013-09-13 NOTE — Patient Instructions (Signed)
Your physician wants you to follow-up in: YEAR WITH DR NISHAN  You will receive a reminder letter in the mail two months in advance. If you don't receive a letter, please call our office to schedule the follow-up appointment.  Your physician recommends that you continue on your current medications as directed. Please refer to the Current Medication list given to you today. 

## 2013-09-13 NOTE — Assessment & Plan Note (Signed)
Well controlled.  Continue current medications and low sodium Dash type diet.    

## 2013-09-13 NOTE — Assessment & Plan Note (Signed)
Cholesterol is at goal.  Continue current dose of statin and diet Rx.  No myalgias or side effects.  F/U  LFT's in 6 months. Lab Results  Component Value Date   LDLCALC  Value: 55        Total Cholesterol/HDL:CHD Risk Coronary Heart Disease Risk Table                     Men   Women  1/2 Average Risk   3.4   3.3  Average Risk       5.0   4.4  2 X Average Risk   9.6   7.1  3 X Average Risk  23.4   11.0        Use the calculated Patient Ratio above and the CHD Risk Table to determine the patient's CHD Risk.        ATP III CLASSIFICATION (LDL):  <100     mg/dL   Optimal  100-129  mg/dL   Near or Above                    Optimal  130-159  mg/dL   Borderline  160-189  mg/dL   High  >190     mg/dL   Very High 07/10/2009   Labs with Dr Shelia Media

## 2013-09-13 NOTE — Progress Notes (Signed)
Patient ID: [name], female   DOB: 1935-06-16, 78 y.o.   MRN: [id number] 78 year old white female patient who was initially seen in 2011 for atypical chest pain in setting of pneumonia  Echocardiogram showed LVH with normal LV function ejection fraction 60%. She was scheduled for a stress Myoview as an outpatient which was performed July 18, 2009. This showed normal LV function, normal wall motion, no ischemia, there was brief SVT with stress but no scar or ischemia. She currently has no complaints. I think that the multiple previous cardiac referrals from Dr [name] have mostly been because of a positive family history in both mother and father. Risk stratification with calcium score done 8/14   Calcium score 17. 33rd percentile for age and sex matched  controls. 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  All other systems reviewed and negative  General: Affect appropriate Healthy:  appears stated age 78: normal Neck supple with no adenopathy JVP normal no bruits no thyromegaly Lungs clear with no wheezing and good diaphragmatic motion Heart:  S1/S2 no murmur, no rub, gallop or click PMI normal Abdomen: benighn, BS positve, no tenderness, no AAA no bruit.  No HSM or HJR Distal pulses intact with no bruits No edema Neuro non-focal Skin warm and dry No muscular weakness   Current Outpatient Prescriptions  Medication Sig Dispense Refill  . aspirin 81 MG tablet Take 81 mg by mouth daily.        . Calcium-Vitamin D (CALTRATE 600 PLUS-VIT D PO) Take 1 tablet by mouth 2 (two) times daily.      Marland Kitchen denosumab (PROLIA) 60 MG/ML SOLN injection Inject 60 mg  into the skin every 6 (six) months. Administer in upper arm, thigh, or abdomen      . Estradiol (VAGIFEM) 10 MCG TABS Place 1 tablet vaginally 3 (three) times a week.       . losartan (COZAAR) 100 MG tablet Take 100 mg by mouth daily.      . Omega-3 Fatty Acids (FISH OIL) 1000 MG CAPS Take 2 capsules by mouth daily.       . rosuvastatin (CRESTOR) 10 MG tablet Take 10 mg by mouth daily.         No current facility-administered medications for this visit.    Allergies  Sulfonamide derivatives  Electrocardiogram:   8/14 SR rate 88 normal 2014  Today SR rate 69 PR 222 otherwise normal   Assessment and Plan

## 2013-09-13 NOTE — Assessment & Plan Note (Signed)
Non recurrent previous normal stress test Calcium score 17 in 2014

## 2013-09-21 DIAGNOSIS — E2839 Other primary ovarian failure: Secondary | ICD-10-CM | POA: Diagnosis not present

## 2013-09-21 DIAGNOSIS — M810 Age-related osteoporosis without current pathological fracture: Secondary | ICD-10-CM | POA: Diagnosis not present

## 2013-09-22 ENCOUNTER — Encounter: Payer: Self-pay | Admitting: Cardiovascular Disease

## 2013-09-22 DIAGNOSIS — Z0000 Encounter for general adult medical examination without abnormal findings: Secondary | ICD-10-CM | POA: Diagnosis not present

## 2013-09-22 DIAGNOSIS — E7800 Pure hypercholesterolemia, unspecified: Secondary | ICD-10-CM | POA: Diagnosis not present

## 2013-09-22 DIAGNOSIS — R7309 Other abnormal glucose: Secondary | ICD-10-CM | POA: Diagnosis not present

## 2013-09-22 DIAGNOSIS — M810 Age-related osteoporosis without current pathological fracture: Secondary | ICD-10-CM | POA: Diagnosis not present

## 2013-09-28 DIAGNOSIS — N3289 Other specified disorders of bladder: Secondary | ICD-10-CM | POA: Diagnosis not present

## 2013-09-28 DIAGNOSIS — I10 Essential (primary) hypertension: Secondary | ICD-10-CM | POA: Diagnosis not present

## 2013-09-28 DIAGNOSIS — E7800 Pure hypercholesterolemia, unspecified: Secondary | ICD-10-CM | POA: Diagnosis not present

## 2013-09-28 DIAGNOSIS — R300 Dysuria: Secondary | ICD-10-CM | POA: Diagnosis not present

## 2013-09-28 DIAGNOSIS — M810 Age-related osteoporosis without current pathological fracture: Secondary | ICD-10-CM | POA: Diagnosis not present

## 2013-10-05 ENCOUNTER — Ambulatory Visit (INDEPENDENT_AMBULATORY_CARE_PROVIDER_SITE_OTHER)
Admission: RE | Admit: 2013-10-05 | Discharge: 2013-10-05 | Disposition: A | Discharge status: Home / Self Care | Payer: Medicare Other | Source: Ambulatory Visit | Attending: Emergency Medicine | Admitting: Emergency Medicine

## 2013-10-05 DIAGNOSIS — J984 Other disorders of lung: Secondary | ICD-10-CM | POA: Diagnosis not present

## 2013-10-05 DIAGNOSIS — Z0000 Encounter for general adult medical examination without abnormal findings: Secondary | ICD-10-CM | POA: Diagnosis not present

## 2013-10-05 DIAGNOSIS — R918 Other nonspecific abnormal finding of lung field: Secondary | ICD-10-CM | POA: Diagnosis not present

## 2013-10-08 ENCOUNTER — Encounter: Payer: Self-pay | Admitting: Emergency Medicine

## 2013-10-08 ENCOUNTER — Ambulatory Visit (INDEPENDENT_AMBULATORY_CARE_PROVIDER_SITE_OTHER): Payer: Medicare Other | Admitting: Emergency Medicine

## 2013-10-08 VITALS — BP 146/82 | HR 80 | Ht 65.5 in | Wt 183.0 lb

## 2013-10-08 DIAGNOSIS — R918 Other nonspecific abnormal finding of lung field: Secondary | ICD-10-CM | POA: Diagnosis not present

## 2013-10-08 NOTE — Assessment & Plan Note (Signed)
Stable by repeat CT scan. She is low risk, should need one more final CT in 09/2014 to insure no changes

## 2013-10-08 NOTE — Progress Notes (Signed)
Subjective:    Patient ID: [name], female    DOB: 1935-07-14, 78 y.o.   MRN: [id number]  HPI 78 yo never smoker w hx breast CA s/p R mastectomy, HTN, hyperlipidemia. She underwent a cardiac scoring CT scan 09/14/12 that revealed two small pulm nodules > R minor fissure and the lingula. Denies any breathing sx, cough. No wheeze.   10/08/13 -- Follow up visit today for small RLL and lingular nodules, 74mm and 78mm nodules. CT chest from 9/15 > stable in size and appearance. Also a stable basilar bronchiectatic changes. She will need a repeat scan.    Review of Systems  Constitutional: Negative for fever and unexpected weight change.  HENT: Negative for congestion, dental problem, ear pain, nosebleeds, postnasal drip, rhinorrhea, sinus pressure, sneezing, sore throat and trouble swallowing.   Eyes: Negative for redness and itching.  Respiratory: Negative for cough, chest tightness, shortness of breath and wheezing.   Cardiovascular: Negative for palpitations and leg swelling.  Gastrointestinal: Negative for nausea and vomiting.  Genitourinary: Negative for dysuria.  Musculoskeletal: Positive for joint swelling.       Pain and stiffness   Skin: Negative for rash.  Neurological: Negative for headaches.  Hematological: Does not bruise/bleed easily.  Psychiatric/Behavioral: Negative for dysphoric mood. The patient is not nervous/anxious.     Past Medical History  Diagnosis Date  . Chest pain, unspecified   . Cardiomegaly     Normal echocardiogram and cardiac workup by Dr. Johnsie Cancel  . Hyperlipidemia   . Hypertension   . Polyp of gallbladder   . Thyroid nodule   . Irritable bladder   . Acne rosacea   . Osteopenia   . Lymphedema of arm     Right, post lumph node infection  . Breast cancer     Has had a prior right right mastectomy and chemotherapy     Family History  Problem Relation Age of Onset  . Heart attack Father 9  . Coronary artery disease       History   Social  History  . Marital Status: Married    Spouse Name: N/A    Number of Children: N/A  . Years of Education: N/A   Occupational History  . Not on file.   Social History Main Topics  . Smoking status: Never Smoker   . Smokeless tobacco: Not on file  . Alcohol Use: Yes     Comment: occasionally  . Drug Use: Not on file  . Sexual Activity: Not on file   Other Topics Concern  . Not on file   Social History Narrative  . No narrative on file     Allergies  Allergen Reactions  . Sulfonamide Derivatives     REACTION: Anaphylactic Reaction     Outpatient Prescriptions Prior to Visit  Medication Sig Dispense Refill  . aspirin 81 MG tablet Take 81 mg by mouth daily.        . Calcium-Vitamin D (CALTRATE 600 PLUS-VIT D PO) Take 1 tablet by mouth 2 (two) times daily.      Marland Kitchen denosumab (PROLIA) 60 MG/ML SOLN injection Inject 60 mg into the skin every 6 (six) months. Administer in upper arm, thigh, or abdomen      . Estradiol (VAGIFEM) 10 MCG TABS Place 1 tablet vaginally 3 (three) times a week.       . losartan (COZAAR) 100 MG tablet Take 100 mg by mouth daily.      . Omega-3 Fatty Acids (FISH OIL)  1000 MG CAPS Take 2 capsules by mouth daily.       . rosuvastatin (CRESTOR) 10 MG tablet Take 10 mg by mouth daily.        . Tetrahydrozoline HCl (EYE DROPS OP) Apply to eye daily at 6 (six) AM.       No facility-administered medications prior to visit.     CT scan 03/23/13 --  COMPARISON: CT CARDIAC SCORING dated 09/09/2012  FINDINGS:  No pathologically enlarged mediastinal or axillary lymph nodes.  Surgical clips are seen in the right axilla. Hilar regions are  difficult to definitively evaluate in the absence of IV contrast.  Coronary artery calcification. Heart is mildly enlarged. No  pericardial effusion. Surgical clips are seen along the inferior  right anterior chest wall. Probable small hiatal hernia. Calcified  lymph nodes are seen adjacent to the distal esophagus.  A smudgy nodule  along the inferior margin of the minor fissure  measures 8 mm (7 x 8 mm), unchanged. Similarly, a 7 mm (5 x 8 mm)  nodule in the lingula (image 37) is unchanged. There is a basilar  predominant pattern of patchy ground-glass, traction  bronchiolectasis and subpleural reticulation, traction  bronchiolectasis, ground-glass and mild architectural distortion,  unchanged from 09/09/2012. Mild subpleural reticulation. Findings do  not appear changed from 09/09/2012. Calcified subpleural granuloma  in the right lower lobe. No pleural fluid. Airway is unremarkable.  Incidental imaging of the upper abdomen shows the visualized portion  of the liver to be unremarkable. Tiny calcified gallstone.  Visualized portions of the adrenal glands, kidneys, spleen,  pancreas, stomach and bowel are otherwise grossly unremarkable.  Scattered metallic densities are seen along the ventral abdominal  wall. No upper abdominal adenopathy. No worrisome lytic or sclerotic  lesions. Degenerative changes are seen in the spine.  IMPRESSION:  1. Small nodules along the minor fissure and in the lingula are  unchanged from 09/09/2012. Additional followup in approximately 6  months could be performed to ensure continued stability, in this  patient with a known primary malignancy.  2. Basilar predominant pattern of subpleural reticulation, traction  bronchiolectasis, ground-glass and architectural distortion appears  unchanged from 09/09/2012. Interstitial lung disease such as  nonspecific interstitial pneumonitis (NSIP) or usual interstitial  pneumonitis (UIP) can have this appearance. Underlying etiology may  relate to the patient's treatment for breast cancer.  3. Coronary artery calcification.  4. Tiny gallstone.      Objective:   Physical Exam Filed Vitals:   10/08/13 1330  BP: 146/82  Pulse: 80  Height: 5' 5.5" (1.664 m)  Weight: 183 lb (83.008 kg)  SpO2: 95%   Gen: Pleasant, well-nourished, in no  distress,  normal affect  ENT: No lesions,  mouth clear,  oropharynx clear, no postnasal drip  Neck: No JVD, no TMG, no carotid bruits  Lungs: No use of accessory muscles, R basilar soft crackles, otherwise clear  Cardiovascular: RRR, heart sounds normal, no murmur or gallops, no peripheral edema  Musculoskeletal: No deformities, no cyanosis or clubbing  Neuro: alert, non focal  Skin: Warm, no lesions or rashes     Assessment & Plan:  Pulmonary nodules Stable by repeat CT scan. She is low risk, should need one more final CT in 09/2014 to insure no changes

## 2013-10-08 NOTE — Patient Instructions (Signed)
We will repeat your CT scan of the chest in September 2016.  Follow with Dr Lamonte Sakai in September after the scan to review

## 2013-11-15 DIAGNOSIS — M810 Age-related osteoporosis without current pathological fracture: Secondary | ICD-10-CM | POA: Diagnosis not present

## 2014-04-07 DIAGNOSIS — M25562 Pain in left knee: Secondary | ICD-10-CM | POA: Diagnosis not present

## 2014-04-07 DIAGNOSIS — M1712 Unilateral primary osteoarthritis, left knee: Secondary | ICD-10-CM | POA: Diagnosis not present

## 2014-04-28 DIAGNOSIS — M810 Age-related osteoporosis without current pathological fracture: Secondary | ICD-10-CM | POA: Diagnosis not present

## 2014-04-28 DIAGNOSIS — M2550 Pain in unspecified joint: Secondary | ICD-10-CM | POA: Diagnosis not present

## 2014-05-30 DIAGNOSIS — L900 Lichen sclerosus et atrophicus: Secondary | ICD-10-CM | POA: Diagnosis not present

## 2014-07-02 DIAGNOSIS — M1712 Unilateral primary osteoarthritis, left knee: Secondary | ICD-10-CM | POA: Diagnosis not present

## 2014-07-15 DIAGNOSIS — M25562 Pain in left knee: Secondary | ICD-10-CM | POA: Diagnosis not present

## 2014-07-15 DIAGNOSIS — S83232A Complex tear of medial meniscus, current injury, left knee, initial encounter: Secondary | ICD-10-CM | POA: Diagnosis not present

## 2014-07-15 DIAGNOSIS — M1712 Unilateral primary osteoarthritis, left knee: Secondary | ICD-10-CM | POA: Diagnosis not present

## 2014-07-27 ENCOUNTER — Other Ambulatory Visit: Payer: Self-pay

## 2014-07-27 DIAGNOSIS — Z9011 Acquired absence of right breast and nipple: Secondary | ICD-10-CM

## 2014-07-27 DIAGNOSIS — Z1231 Encounter for screening mammogram for malignant neoplasm of breast: Secondary | ICD-10-CM

## 2014-07-28 ENCOUNTER — Ambulatory Visit: Payer: Self-pay | Admitting: Orthopedic Surgery

## 2014-07-28 NOTE — Progress Notes (Signed)
Preoperative surgical orders have been place into the Epic hospital system for [name] on 07/28/2014, 9:35 AM  by Mickel Crow for surgery on 08-10-2014.  Preop Knee Scope orders including IV Tylenol and IV Decadron as long as there are no contraindications to the above medications. Arlee Muslim, PA-C

## 2014-08-03 NOTE — Patient Instructions (Signed)
Anwita Mencer  08/03/2014   Your procedure is scheduled on:   08/10/2014    Report to [facility]  Entrance take [place]  elevators to 3rd floor to  [place] at      [place] AM.  Call this number if you have problems the morning of surgery [phone]   Remember: ONLY 1 PERSON MAY GO WITH YOU TO SHORT STAY TO GET  READY MORNING OF YOUR SURGERY.  Do not eat food or drink liquids :After Midnight.     Take these medicines the morning of surgery with A SIP OF WATER: none                                You may not have any metal on your body including hair pins and              piercings  Do not wear jewelry, make-up, lotions, powders or perfumes, deodorant             Do not wear nail polish.  Do not shave  48 hours prior to surgery.                Do not bring valuables to the hospital. [place].  Contacts, dentures or bridgework may not be worn into surgery.      Patients discharged the day of surgery will not be allowed to drive home.  Name and phone number of your driver:  Special Instructions: coughing and deep breathing exercises, leg exercises               Please read over the following fact sheets you were given: _____________________________________________________________________             [facility] - Preparing for Surgery Before surgery, you can play an important role.  Because skin is not sterile, your skin needs to be as free of germs as possible.  You can reduce the number of germs on your skin by washing with CHG (chlorahexidine gluconate) soap before surgery.  CHG is an antiseptic cleaner which kills germs and bonds with the skin to continue killing germs even after washing. Please DO NOT use if you have an allergy to CHG or antibacterial soaps.  If your skin becomes reddened/irritated stop using the CHG and inform your nurse when you arrive at Short Stay. Do not shave (including  legs and underarms) for at least 48 hours prior to the first CHG shower.  You may shave your face/neck. Please follow these instructions carefully:  1.  Shower with CHG Soap the night before surgery and the  morning of Surgery.  2.  If you choose to wash your hair, wash your hair first as usual with your  normal  shampoo.  3.  After you shampoo, rinse your hair and body thoroughly to remove the  shampoo.                           4.  Use CHG as you would any other liquid soap.  You can apply chg directly  to the skin and wash  Gently with a scrungie or clean washcloth.  5.  Apply the CHG Soap to your body ONLY FROM THE NECK DOWN.   Do not use on face/ open                           Wound or open sores. Avoid contact with eyes, ears mouth and genitals (private parts).                       Wash face,  Genitals (private parts) with your normal soap.             6.  Wash thoroughly, paying special attention to the area where your surgery  will be performed.  7.  Thoroughly rinse your body with warm water from the neck down.  8.  DO NOT shower/wash with your normal soap after using and rinsing off  the CHG Soap.                9.  Pat yourself dry with a clean towel.            10.  Wear clean pajamas.            11.  Place clean sheets on your bed the night of your first shower and do not  sleep with pets. Day of Surgery : Do not apply any lotions/deodorants the morning of surgery.  Please wear clean clothes to the hospital/surgery center.  FAILURE TO FOLLOW THESE INSTRUCTIONS MAY RESULT IN THE CANCELLATION OF YOUR SURGERY PATIENT SIGNATURE_________________________________  NURSE SIGNATURE__________________________________  ________________________________________________________________________   Adam Phenix  An incentive spirometer is a tool that can help keep your lungs clear and active. This tool measures how well you are filling your lungs with each breath.  Taking long deep breaths may help reverse or decrease the chance of developing breathing (pulmonary) problems (especially infection) following:  A long period of time when you are unable to move or be active. BEFORE THE PROCEDURE   If the spirometer includes an indicator to show your best effort, your nurse or respiratory therapist will set it to a desired goal.  If possible, sit up straight or lean slightly forward. Try not to slouch.  Hold the incentive spirometer in an upright position. INSTRUCTIONS FOR USE  1. Sit on the edge of your bed if possible, or sit up as far as you can in bed or on a chair. 2. Hold the incentive spirometer in an upright position. 3. Breathe out normally. 4. Place the mouthpiece in your mouth and seal your lips tightly around it. 5. Breathe in slowly and as deeply as possible, raising the piston or the ball toward the top of the column. 6. Hold your breath for 3-5 seconds or for as long as possible. Allow the piston or ball to fall to the bottom of the column. 7. Remove the mouthpiece from your mouth and breathe out normally. 8. Rest for a few seconds and repeat Steps 1 through 7 at least 10 times every 1-2 hours when you are awake. Take your time and take a few normal breaths between deep breaths. 9. The spirometer may include an indicator to show your best effort. Use the indicator as a goal to work toward during each repetition. 10. After each set of 10 deep breaths, practice coughing to be sure your lungs are clear. If you have an incision (the cut made at the time of surgery),  support your incision when coughing by placing a pillow or rolled up towels firmly against it. Once you are able to get out of bed, walk around indoors and cough well. You may stop using the incentive spirometer when instructed by your caregiver.  RISKS AND COMPLICATIONS  Take your time so you do not get dizzy or light-headed.  If you are in pain, you may need to take or ask for pain  medication before doing incentive spirometry. It is harder to take a deep breath if you are having pain. AFTER USE  Rest and breathe slowly and easily.  It can be helpful to keep track of a log of your progress. Your caregiver can provide you with a simple table to help with this. If you are using the spirometer at home, follow these instructions: [place] IF:   You are having difficultly using the spirometer.  You have trouble using the spirometer as often as instructed.  Your pain medication is not giving enough relief while using the spirometer.  You develop fever of 100.5 F (38.1 C) or higher. SEEK IMMEDIATE MEDICAL CARE IF:   You cough up bloody sputum that had not been present before.  You develop fever of 102 F (38.9 C) or greater.  You develop worsening pain at or near the incision site. MAKE SURE YOU:   Understand these instructions.  Will watch your condition.  Will get help right away if you are not doing well or get worse. Document Released: 05/20/2006 Document Revised: 04/01/2011 Document Reviewed: 07/21/2006 [facility] Patient Information 2014 [place], [place].   ________________________________________________________________________

## 2014-08-04 DIAGNOSIS — H524 Presbyopia: Secondary | ICD-10-CM | POA: Diagnosis not present

## 2014-08-04 DIAGNOSIS — H04123 Dry eye syndrome of bilateral lacrimal glands: Secondary | ICD-10-CM | POA: Diagnosis not present

## 2014-08-04 DIAGNOSIS — H35033 Hypertensive retinopathy, bilateral: Secondary | ICD-10-CM | POA: Diagnosis not present

## 2014-08-05 ENCOUNTER — Encounter (HOSPITAL_COMMUNITY)
Admission: RE | Admit: 2014-08-05 | Discharge: 2014-08-05 | Disposition: A | Discharge status: Home / Self Care | Payer: Medicare Other | Source: Ambulatory Visit | Attending: Orthopedic Surgery | Admitting: Orthopedic Surgery

## 2014-08-05 ENCOUNTER — Encounter (HOSPITAL_COMMUNITY): Payer: Self-pay

## 2014-08-05 DIAGNOSIS — S83242A Other tear of medial meniscus, current injury, left knee, initial encounter: Secondary | ICD-10-CM | POA: Insufficient documentation

## 2014-08-05 DIAGNOSIS — Z01812 Encounter for preprocedural laboratory examination: Secondary | ICD-10-CM | POA: Insufficient documentation

## 2014-08-05 DIAGNOSIS — X58XXXA Exposure to other specified factors, initial encounter: Secondary | ICD-10-CM | POA: Diagnosis not present

## 2014-08-05 HISTORY — DX: Unspecified osteoarthritis, unspecified site: M19.90

## 2014-08-05 HISTORY — DX: Pneumonia, unspecified organism: J18.9

## 2014-08-05 LAB — BASIC METABOLIC PANEL
Anion gap: 5 (ref 5–15)
BUN: 14 mg/dL (ref 6–20)
CALCIUM: 9.2 mg/dL (ref 8.9–10.3)
CO2: 29 mmol/L (ref 22–32)
Chloride: 109 mmol/L (ref 101–111)
Creatinine, Ser: 0.76 mg/dL (ref 0.44–1.00)
GFR calc Af Amer: >60 mL/min (ref >60)
GFR calc non Af Amer: >60 mL/min (ref >60)
GLUCOSE: 103 mg/dL — AB (ref 65–99)
Potassium: 5.6 mmol/L — ABNORMAL HIGH (ref 3.5–5.1)
Sodium: 143 mmol/L (ref 135–145)

## 2014-08-05 LAB — CBC
HEMATOCRIT: 44 % (ref 36.0–46.0)
HEMOGLOBIN: 15 g/dL (ref 12.0–15.0)
MCH: 30.5 pg (ref 26.0–34.0)
MCHC: 34.1 g/dL (ref 30.0–36.0)
MCV: 89.6 fL (ref 78.0–100.0)
Platelets: 213 10*3/uL (ref 150–400)
RBC: 4.91 MIL/uL (ref 3.87–5.11)
RDW: 14.1 % (ref 11.5–15.5)
WBC: 5.1 10*3/uL (ref 4.0–10.5)

## 2014-08-05 NOTE — Progress Notes (Signed)
08-05-14- Per Dr. Wynelle Link - Repeat BMP day of surgery - (order added)

## 2014-08-05 NOTE — Progress Notes (Signed)
10-08-13 - LOV - Dr. Lamonte Sakai (pulmonary) - EPIC 10-05-13 - CT Chest - EPIC 09-13-13- LOV - Dr. Johnsie Cancel (cardio) - EPIC 09-13-13 - EKG - EPIC

## 2014-08-05 NOTE — Progress Notes (Signed)
08-05-14 - Faxed BMP lab results from preop visit on 08-05-14 to Dr. Wynelle Link via [facility]

## 2014-08-10 ENCOUNTER — Ambulatory Visit (HOSPITAL_COMMUNITY): Payer: Medicare Other | Admitting: Anesthesiology

## 2014-08-10 ENCOUNTER — Encounter (HOSPITAL_COMMUNITY)
Admission: RE | Disposition: A | Discharge status: Home / Self Care | Payer: Self-pay | Source: Ambulatory Visit | Attending: Orthopedic Surgery

## 2014-08-10 ENCOUNTER — Ambulatory Visit (HOSPITAL_COMMUNITY)
Admission: RE | Admit: 2014-08-10 | Discharge: 2014-08-10 | Disposition: A | Discharge status: Home / Self Care | Payer: Medicare Other | Source: Ambulatory Visit | Attending: Orthopedic Surgery | Admitting: Orthopedic Surgery

## 2014-08-10 ENCOUNTER — Encounter (HOSPITAL_COMMUNITY): Payer: Self-pay | Admitting: Anesthesiology

## 2014-08-10 DIAGNOSIS — Z79890 Hormone replacement therapy (postmenopausal): Secondary | ICD-10-CM | POA: Diagnosis not present

## 2014-08-10 DIAGNOSIS — Z853 Personal history of malignant neoplasm of breast: Secondary | ICD-10-CM | POA: Insufficient documentation

## 2014-08-10 DIAGNOSIS — M8580 Other specified disorders of bone density and structure, unspecified site: Secondary | ICD-10-CM | POA: Insufficient documentation

## 2014-08-10 DIAGNOSIS — C50919 Malignant neoplasm of unspecified site of unspecified female breast: Secondary | ICD-10-CM | POA: Diagnosis not present

## 2014-08-10 DIAGNOSIS — Y939 Activity, unspecified: Secondary | ICD-10-CM | POA: Insufficient documentation

## 2014-08-10 DIAGNOSIS — Y929 Unspecified place or not applicable: Secondary | ICD-10-CM | POA: Insufficient documentation

## 2014-08-10 DIAGNOSIS — M1990 Unspecified osteoarthritis, unspecified site: Secondary | ICD-10-CM | POA: Insufficient documentation

## 2014-08-10 DIAGNOSIS — Z79899 Other long term (current) drug therapy: Secondary | ICD-10-CM | POA: Diagnosis not present

## 2014-08-10 DIAGNOSIS — E785 Hyperlipidemia, unspecified: Secondary | ICD-10-CM | POA: Diagnosis not present

## 2014-08-10 DIAGNOSIS — S83242A Other tear of medial meniscus, current injury, left knee, initial encounter: Secondary | ICD-10-CM | POA: Insufficient documentation

## 2014-08-10 DIAGNOSIS — S83249A Other tear of medial meniscus, current injury, unspecified knee, initial encounter: Secondary | ICD-10-CM | POA: Diagnosis present

## 2014-08-10 DIAGNOSIS — Z7982 Long term (current) use of aspirin: Secondary | ICD-10-CM | POA: Insufficient documentation

## 2014-08-10 DIAGNOSIS — X58XXXA Exposure to other specified factors, initial encounter: Secondary | ICD-10-CM | POA: Diagnosis not present

## 2014-08-10 DIAGNOSIS — M2242 Chondromalacia patellae, left knee: Secondary | ICD-10-CM | POA: Insufficient documentation

## 2014-08-10 DIAGNOSIS — M25562 Pain in left knee: Secondary | ICD-10-CM | POA: Diagnosis present

## 2014-08-10 DIAGNOSIS — Y999 Unspecified external cause status: Secondary | ICD-10-CM | POA: Diagnosis not present

## 2014-08-10 DIAGNOSIS — I10 Essential (primary) hypertension: Secondary | ICD-10-CM | POA: Insufficient documentation

## 2014-08-10 DIAGNOSIS — M23322 Other meniscus derangements, posterior horn of medial meniscus, left knee: Secondary | ICD-10-CM | POA: Diagnosis not present

## 2014-08-10 HISTORY — PX: KNEE ARTHROSCOPY: SHX127

## 2014-08-10 HISTORY — DX: Other tear of medial meniscus, current injury, unspecified knee, initial encounter: S83.249A

## 2014-08-10 LAB — BASIC METABOLIC PANEL
Anion gap: 6 (ref 5–15)
BUN: 17 mg/dL (ref 6–20)
CHLORIDE: 107 mmol/L (ref 101–111)
CO2: 28 mmol/L (ref 22–32)
Calcium: 9.2 mg/dL (ref 8.9–10.3)
Creatinine, Ser: 0.72 mg/dL (ref 0.44–1.00)
GFR calc Af Amer: >60 mL/min (ref >60)
Glucose, Bld: 107 mg/dL — ABNORMAL HIGH (ref 65–99)
POTASSIUM: 4.3 mmol/L (ref 3.5–5.1)
SODIUM: 141 mmol/L (ref 135–145)

## 2014-08-10 SURGERY — ARTHROSCOPY, KNEE
Anesthesia: General | Site: Knee | Laterality: Left

## 2014-08-10 MED ORDER — FENTANYL CITRATE (PF) 100 MCG/2ML IJ SOLN: 25.0000 ug | INTRAMUSCULAR | Status: DC | PRN

## 2014-08-10 MED ORDER — ACETAMINOPHEN 10 MG/ML IV SOLN
INTRAVENOUS | Status: AC
  Filled 2014-08-10: qty 100

## 2014-08-10 MED ORDER — CEFAZOLIN SODIUM-DEXTROSE 2-3 GM-% IV SOLR
2.0000 g | INTRAVENOUS | Status: AC
  Administered 2014-08-10: 2 g via INTRAVENOUS

## 2014-08-10 MED ORDER — ACETAMINOPHEN 10 MG/ML IV SOLN
1000.0000 mg | Freq: Once | INTRAVENOUS | Status: AC
  Administered 2014-08-10: 1000 mg via INTRAVENOUS
  Filled 2014-08-10: qty 100

## 2014-08-10 MED ORDER — LACTATED RINGERS IV SOLN
INTRAVENOUS | Status: DC | PRN
  Administered 2014-08-10: 09:00:00 via INTRAVENOUS

## 2014-08-10 MED ORDER — METHOCARBAMOL 500 MG PO TABS
500.0000 mg | ORAL_TABLET | Freq: Four times a day (QID) | ORAL | Status: DC
Start: 2014-08-10 — End: 2014-10-19

## 2014-08-10 MED ORDER — HYDROCODONE-ACETAMINOPHEN 5-325 MG PO TABS
1.0000 | ORAL_TABLET | Freq: Once | ORAL | Status: AC
  Administered 2014-08-10: 1 via ORAL
  Filled 2014-08-10: qty 1

## 2014-08-10 MED ORDER — LACTATED RINGERS IV SOLN: INTRAVENOUS | Status: DC

## 2014-08-10 MED ORDER — MIDAZOLAM HCL 2 MG/2ML IJ SOLN
INTRAMUSCULAR | Status: AC
  Filled 2014-08-10: qty 2

## 2014-08-10 MED ORDER — METHOCARBAMOL 500 MG PO TABS
500.0000 mg | ORAL_TABLET | Freq: Once | ORAL | Status: AC
  Administered 2014-08-10: 500 mg via ORAL
  Filled 2014-08-10: qty 1

## 2014-08-10 MED ORDER — ONDANSETRON HCL 4 MG/2ML IJ SOLN
INTRAMUSCULAR | Status: AC
  Filled 2014-08-10: qty 2

## 2014-08-10 MED ORDER — CEFAZOLIN SODIUM-DEXTROSE 2-3 GM-% IV SOLR
INTRAVENOUS | Status: AC
  Filled 2014-08-10: qty 50

## 2014-08-10 MED ORDER — DEXAMETHASONE SODIUM PHOSPHATE 10 MG/ML IJ SOLN
INTRAMUSCULAR | Status: AC
  Filled 2014-08-10: qty 1

## 2014-08-10 MED ORDER — HYDROCODONE-ACETAMINOPHEN 5-325 MG PO TABS: 1.0000 | ORAL_TABLET | Freq: Four times a day (QID) | ORAL | Status: DC | PRN

## 2014-08-10 MED ORDER — FENTANYL CITRATE (PF) 100 MCG/2ML IJ SOLN
INTRAMUSCULAR | Status: AC
  Filled 2014-08-10: qty 2

## 2014-08-10 MED ORDER — LIDOCAINE HCL (CARDIAC) 20 MG/ML IV SOLN
INTRAVENOUS | Status: DC | PRN
  Administered 2014-08-10 (×2): 50 mg via INTRAVENOUS

## 2014-08-10 MED ORDER — LACTATED RINGERS IR SOLN
Status: DC | PRN
  Administered 2014-08-10: 3000 mL

## 2014-08-10 MED ORDER — FENTANYL CITRATE (PF) 100 MCG/2ML IJ SOLN
INTRAMUSCULAR | Status: DC | PRN
  Administered 2014-08-10: 25 ug via INTRAVENOUS
  Administered 2014-08-10: 75 ug via INTRAVENOUS

## 2014-08-10 MED ORDER — BUPIVACAINE-EPINEPHRINE 0.25% -1:200000 IJ SOLN
INTRAMUSCULAR | Status: DC | PRN
  Administered 2014-08-10: 30 mL

## 2014-08-10 MED ORDER — SODIUM CHLORIDE 0.9 % IV SOLN: INTRAVENOUS | Status: DC

## 2014-08-10 MED ORDER — BUPIVACAINE-EPINEPHRINE (PF) 0.25% -1:200000 IJ SOLN
INTRAMUSCULAR | Status: AC
  Filled 2014-08-10: qty 30

## 2014-08-10 MED ORDER — MIDAZOLAM HCL 5 MG/5ML IJ SOLN
INTRAMUSCULAR | Status: DC | PRN
  Administered 2014-08-10: 1 mg via INTRAVENOUS

## 2014-08-10 MED ORDER — CHLORHEXIDINE GLUCONATE 4 % EX LIQD: 60.0000 mL | Freq: Once | CUTANEOUS | Status: DC

## 2014-08-10 MED ORDER — PROPOFOL 10 MG/ML IV BOLUS
INTRAVENOUS | Status: DC | PRN
  Administered 2014-08-10: 200 mg via INTRAVENOUS

## 2014-08-10 MED ORDER — ARTIFICIAL TEARS OP OINT
TOPICAL_OINTMENT | OPHTHALMIC | Status: AC
  Filled 2014-08-10: qty 3.5

## 2014-08-10 MED ORDER — DEXAMETHASONE SODIUM PHOSPHATE 10 MG/ML IJ SOLN
10.0000 mg | Freq: Once | INTRAMUSCULAR | Status: AC
  Administered 2014-08-10: 10 mg via INTRAVENOUS

## 2014-08-10 MED ORDER — ONDANSETRON HCL 4 MG/2ML IJ SOLN
INTRAMUSCULAR | Status: DC | PRN
  Administered 2014-08-10: 4 mg via INTRAVENOUS

## 2014-08-10 MED ORDER — PROPOFOL 10 MG/ML IV BOLUS
INTRAVENOUS | Status: AC
  Filled 2014-08-10: qty 20

## 2014-08-10 SURGICAL SUPPLY — 28 items
BANDAGE ELASTIC 6 VELCRO ST LF (GAUZE/BANDAGES/DRESSINGS) ×3 IMPLANT
BLADE 4.2CUDA (BLADE) ×3 IMPLANT
COVER SURGICAL LIGHT HANDLE (MISCELLANEOUS) ×3 IMPLANT
CUFF TOURN SGL QUICK 34 (TOURNIQUET CUFF) ×2
CUFF TRNQT CYL 34X4X40X1 (TOURNIQUET CUFF) ×1 IMPLANT
DRAPE U-SHAPE 47X51 STRL (DRAPES) ×3 IMPLANT
DRSG ADAPTIC 3X8 NADH LF (GAUZE/BANDAGES/DRESSINGS) ×3 IMPLANT
DRSG EMULSION OIL 3X3 NADH (GAUZE/BANDAGES/DRESSINGS) ×3 IMPLANT
DRSG PAD ABDOMINAL 8X10 ST (GAUZE/BANDAGES/DRESSINGS) ×3 IMPLANT
DURAPREP 26ML APPLICATOR (WOUND CARE) ×3 IMPLANT
GAUZE SPONGE 4X4 12PLY STRL (GAUZE/BANDAGES/DRESSINGS) ×3 IMPLANT
GLOVE BIO SURGEON STRL SZ8 (GLOVE) ×3 IMPLANT
GLOVE BIOGEL PI IND STRL 8 (GLOVE) ×1 IMPLANT
GLOVE BIOGEL PI INDICATOR 8 (GLOVE) ×2
GOWN STRL REUS W/TWL LRG LVL3 (GOWN DISPOSABLE) ×3 IMPLANT
KIT BASIN OR (CUSTOM PROCEDURE TRAY) ×3 IMPLANT
MANIFOLD NEPTUNE II (INSTRUMENTS) ×3 IMPLANT
PACK ARTHROSCOPY WL (CUSTOM PROCEDURE TRAY) ×3 IMPLANT
PACK ICE MAXI GEL EZY WRAP (MISCELLANEOUS) ×9 IMPLANT
PADDING CAST COTTON 6X4 STRL (CAST SUPPLIES) ×6 IMPLANT
PEN SKIN MARKING BROAD (MISCELLANEOUS) ×3 IMPLANT
POSITIONER SURGICAL ARM (MISCELLANEOUS) IMPLANT
SET ARTHROSCOPY TUBING (MISCELLANEOUS) ×2
SET ARTHROSCOPY TUBING LN (MISCELLANEOUS) ×1 IMPLANT
SUT ETHILON 4 0 PS 2 18 (SUTURE) ×3 IMPLANT
TOWEL OR 17X26 10 PK STRL BLUE (TOWEL DISPOSABLE) ×3 IMPLANT
WAND 90 DEG TURBOVAC W/CORD (SURGICAL WAND) IMPLANT
WRAP KNEE MAXI GEL POST OP (GAUZE/BANDAGES/DRESSINGS) ×3 IMPLANT

## 2014-08-10 NOTE — Transfer of Care (Signed)
Immediate Anesthesia Transfer of Care Note  Patient: [name]  Procedure(s) Performed: Procedure(s): LEFT KNEE ARTHROSCOPY WITH MENSCIAL DEBRIDEMENT CONDROPLASTY (Left)  Patient Location: PACU  Anesthesia Type:General  Level of Consciousness:  sedated, patient cooperative and responds to stimulation  Airway & Oxygen Therapy:Patient Spontanous Breathing and Patient connected to face mask oxgen  Post-op Assessment:  Report given to PACU RN and Post -op Vital signs reviewed and stable  Post vital signs:  Reviewed and stable  Last Vitals:  Filed Vitals:   08/10/14 1045  BP: 147/85  Pulse: 71  Temp:   Resp: 14    Complications: No apparent anesthesia complications

## 2014-08-10 NOTE — Interval H&P Note (Signed)
History and Physical Interval Note:  08/10/2014 9:46 AM  [name]  has presented today for surgery, with the diagnosis of LEFT KNEE MEDIAL MENISCAL TEAR   The various methods of treatment have been discussed with the patient and family. After consideration of risks, benefits and other options for treatment, the patient has consented to  Procedure(s): LEFT KNEE ARTHROSCOPY WITH MENSCIAL DEBRIDEMENT (Left) as a surgical intervention .  The patient's history has been reviewed, patient examined, no change in status, stable for surgery.  I have reviewed the patient's chart and labs.  Questions were answered to the patient's satisfaction.     [name]

## 2014-08-10 NOTE — Anesthesia Preprocedure Evaluation (Addendum)
Anesthesia Evaluation  Patient identified by MRN, date of birth, ID band Patient awake    Reviewed: Allergy & Precautions, H&P , NPO status , Patient's Chart, lab work & pertinent test results  Airway Mallampati: II  TM Distance: >3 FB Neck ROM: full    Dental  (+) Dental Advisory Given, Caps Left upper front lateral capped:   Pulmonary neg pulmonary ROS,  breath sounds clear to auscultation  Pulmonary exam normal       Cardiovascular Exercise Tolerance: Good hypertension, Pt. on medications Normal cardiovascular examRhythm:regular Rate:Normal     Neuro/Psych negative neurological ROS  negative psych ROS   GI/Hepatic negative GI ROS, Neg liver ROS,   Endo/Other  negative endocrine ROS  Renal/GU negative Renal ROS  negative genitourinary   Musculoskeletal   Abdominal   Peds  Hematology negative hematology ROS (+)   Anesthesia Other Findings   Reproductive/Obstetrics negative OB ROS                            Anesthesia Physical Anesthesia Plan  ASA: II  Anesthesia Plan: General   Post-op Pain Management:    Induction: Intravenous  Airway Management Planned: LMA  Additional Equipment:   Intra-op Plan:   Post-operative Plan:   Informed Consent: I have reviewed the patients History and Physical, chart, labs and discussed the procedure including the risks, benefits and alternatives for the proposed anesthesia with the patient or authorized representative who has indicated his/her understanding and acceptance.   Dental Advisory Given  Plan Discussed with: CRNA and Surgeon  Anesthesia Plan Comments:         Anesthesia Quick Evaluation

## 2014-08-10 NOTE — H&P (Signed)
  CC- [name] is a 79 y.o. female who presents with left knee pain.  HPI- . Knee Pain: Patient presents with knee pain involving the  left knee. Onset of the symptoms was several months ago. Inciting event: none known. Current symptoms include pain located medially, stiffness and swelling. Pain is aggravated by going up and down stairs, lateral movements, rising after sitting and walking.  Patient has had no prior knee problems. Evaluation to date: MRI: abnormal medial meniscal tear. Treatment to date: rest.  Past Medical History  Diagnosis Date  . Chest pain, unspecified   . Cardiomegaly     Normal echocardiogram and cardiac workup by Dr. Johnsie Cancel  . Hyperlipidemia   . Hypertension   . Polyp of gallbladder   . Thyroid nodule   . Irritable bladder   . Acne rosacea   . Osteopenia   . Lymphedema of arm     Right, post lumph node infection  . Breast cancer     Has had a prior right right mastectomy and chemotherapy  . Pneumonia     5 years ago  . Arthritis     Past Surgical History  Procedure Laterality Date  . Tonsillectomy    . Mastectomy      right breast  . Vesicovaginal fistula closure w/ tah    . Abdominal hysterectomy    . Eye surgery      cataract surgery bilateral    Prior to Admission medications   Medication Sig Start Date End Date Taking? Authorizing Provider  aspirin 81 MG tablet Take 81 mg by mouth at bedtime.    Yes Historical Provider, MD  Calcium-Vitamin D (CALTRATE 600 PLUS-VIT D PO) Take 2 tablets by mouth at bedtime.    Yes Historical Provider, MD  Estradiol (VAGIFEM) 10 MCG TABS Place 1 tablet vaginally 3 (three) times a week.    Yes Historical Provider, MD  losartan (COZAAR) 100 MG tablet Take 100 mg by mouth at bedtime.    Yes Historical Provider, MD  Omega-3 Fatty Acids (FISH OIL) 1000 MG CAPS Take 1,000 mg by mouth at bedtime.    Yes Historical Provider, MD  OVER THE COUNTER MEDICATION Take 2 capsules by mouth at bedtime. Hydro eye.   Yes  Historical Provider, MD  risedronate (ACTONEL) 35 MG tablet Take 35 mg by mouth every Monday.    Yes Historical Provider, MD  rosuvastatin (CRESTOR) 20 MG tablet Take 10 mg by mouth at bedtime.   Yes Historical Provider, MD  denosumab (PROLIA) 60 MG/ML SOLN injection Inject 60 mg into the skin every 6 (six) months. Administer in upper arm, thigh, or abdomen    Historical Provider, MD   KNEE EXAM antalgic gait, soft tissue tenderness over medial joint line, no effusion, negative drawer sign, collateral ligaments intact  Physical Examination: General appearance - alert, well appearing, and in no distress Mental status - alert, oriented to person, place, and time Chest - clear to auscultation, no wheezes, rales or rhonchi, symmetric air entry Heart - normal rate, regular rhythm, normal S1, S2, no murmurs, rubs, clicks or gallops Abdomen - soft, nontender, nondistended, no masses or organomegaly Neurological - alert, oriented, normal speech, no focal findings or movement disorder noted   Asessment/Plan--- Left knee medial meniscal tear- - Plan left knee arthroscopy with meniscal debridement. Procedure risks and potential comps discussed with patient who elects to proceed. Goals are decreased pain and increased function with a high likelihood of achieving both

## 2014-08-10 NOTE — Progress Notes (Signed)
Patient is up w walker to BR to void. Doing well

## 2014-08-10 NOTE — Progress Notes (Signed)
[facility] brought from [facility] for home use

## 2014-08-10 NOTE — Discharge Instructions (Signed)
° °Dr. Frank Aluisio °[facility] Specialist °[facility] °[address] °[place], [place] [address] °[phone] ° ° °Arthroscopic Procedure, Knee °An arthroscopic procedure can find what is wrong with your knee. °PROCEDURE °Arthroscopy is a surgical technique that allows your orthopedic surgeon to diagnose and treat your knee injury with accuracy. They will look into your knee through a small instrument. This is almost like a small (pencil sized) telescope. Because arthroscopy affects your knee less than open knee surgery, you can anticipate a more rapid recovery. Taking an active role by following your caregiver's instructions will help with rapid and complete recovery. Use crutches, rest, elevation, ice, and knee exercises as instructed. The length of recovery depends on various factors including type of injury, age, physical condition, medical conditions, and your rehabilitation. °Your knee is the joint between the large bones (femur and tibia) in your leg. Cartilage covers these bone ends which are smooth and slippery and allow your knee to bend and move smoothly. Two menisci, thick, semi-lunar shaped pads of cartilage which form a rim inside the joint, help absorb shock and stabilize your knee. Ligaments bind the bones together and support your knee joint. Muscles move the joint, help support your knee, and take stress off the joint itself. Because of this all programs and physical therapy to rehabilitate an injured or repaired knee require rebuilding and strengthening your muscles. °AFTER THE PROCEDURE °· After the procedure, you will be moved to a recovery area until most of the effects of the medication have worn off. Your caregiver will discuss the test results with you.  °· Only take over-the-counter or prescription medicines for pain, discomfort, or fever as directed by your caregiver.  °SEEK MEDICAL CARE IF:  °· You have increased bleeding from your wounds.  °· You see  redness, swelling, or have increasing pain in your wounds.  °· You have pus coming from your wound.  °· You have an oral temperature above 102° F (38.9° C).  °· You notice a bad smell coming from the wound or dressing.  °· You have severe pain with any motion of your knee.  °SEEK IMMEDIATE MEDICAL CARE IF:  °· You develop a rash.  °· You have difficulty breathing.  °· You have any allergic problems.  °FURTHER INSTRUCTIONS:  °· ICE to the affected knee every three hours for 30 minutes at a time and then as needed for pain and swelling.  Continue to use ice on the knee for pain and swelling from surgery. You may notice swelling that will progress down to the foot and ankle.  This is normal after surgery.  Elevate the leg when you are not up walking on it.   ° °DIET °You may resume your previous home diet once your are discharged from the hospital. ° °DRESSING / WOUND CARE / SHOWERING °You may start showering two days after being discharged home but do not submerge the incisions under water.  °Change dressing 48 hours after the procedure and then cover the small incisions with band aids until your follow up visit. °Change the surgical dressings daily and reapply a dry dressing each time.  ° °ACTIVITY °Walk with your walker as instructed. °Use walker as long as suggested by your caregivers. °Avoid periods of inactivity such as sitting longer than an hour when not asleep. This helps prevent blood clots.  °You may resume a sexual relationship in one month or when given the OK by your doctor.  °You may return to   work once you are cleared by your doctor.  Do not drive a car for 6 weeks or until released by you surgeon.  Do not drive while taking narcotic  POSTOPERATIVE CONSTIPATION PROTOCOL Constipation - defined medically as fewer than three stools per week and severe constipation as less than one stool per week.  One of the most common issues patients have following surgery is constipation.  Even if you have a  regular bowel pattern at home, your normal regimen is likely to be disrupted due to multiple reasons following surgery.  Combination of anesthesia, postoperative narcotics, change in appetite and fluid intake all can affect your bowels.  In order to avoid complications following surgery, here are some recommendations in order to help you during your recovery period.  Colace (docusate) - Pick up an over-the-counter form of Colace or another stool softener and take twice a day as long as you are requiring postoperative pain medications.  Take with a full glass of water daily.  If you experience loose stools or diarrhea, hold the colace until you stool forms back up.  If your symptoms do not get better within 1 week or if they get worse, check with your doctor.  Dulcolax (bisacodyl) - Pick up over-the-counter and take as directed by the product packaging as needed to assist with the movement of your bowels.  Take with a full glass of water.  Use this product as needed if not relieved by Colace only.   MiraLax (polyethylene glycol) - Pick up over-the-counter to have on hand.  MiraLax is a solution that will increase the amount of water in your bowels to assist with bowel movements.  Take as directed and can mix with a glass of water, juice, soda, coffee, or tea.  Take if you go more than two days without a movement. Do not use MiraLax more than once per day. Call your doctor if you are still constipated or irregular after using this medication for 7 days in a row.  If you continue to have problems with postoperative constipation, please contact the office for further assistance and recommendations.  If you experience "the worst abdominal pain ever" or develop nausea or vomiting, please contact the office immediatly for further recommendations for treatment.  ITCHING  If you experience itching with your medications, try taking only a single pain pill, or even half a pain pill at a time.  You can also use  Benadryl over the counter for itching or also to help with sleep.   TED HOSE STOCKINGS Wear the elastic stockings on both legs for three weeks following surgery during the day but you may remove then at night for sleeping.  MEDICATIONS See your medication summary on the After Visit Summary that the nursing staff will review with you prior to discharge.  You may have some home medications which will be placed on hold until you complete the course of blood thinner medication.  It is important for you to complete the blood thinner medication as prescribed by your surgeon.  Continue your approved medications as instructed at time of discharge. Do not drive while taking narcotics.   PRECAUTIONS If you experience chest pain or shortness of breath - call 911 immediately for transfer to the hospital emergency department.  If you develop a fever greater that 101 F, purulent drainage from wound, increased redness or drainage from wound, foul odor from the wound/dressing, or calf pain - CONTACT YOUR SURGEON.  FOLLOW-UP APPOINTMENTS Make sure you keep all of your appointments after your operation with your surgeon and caregivers. You should call the office at [phone]  and make an appointment for approximately one week after the date of your surgery or on the date instructed by your surgeon outlined in the "After Visit Summary".  RANGE OF MOTION AND STRENGTHENING EXERCISES  Rehabilitation of the knee is important following a knee injury or an operation. After just a few days of immobilization, the muscles of the thigh which control the knee become weakened and shrink (atrophy). Knee exercises are designed to build up the tone and strength of the thigh muscles and to improve knee motion. Often times heat used for twenty to thirty minutes before working out will loosen up your tissues and help with improving the range of motion but do not use heat for the  first two weeks following surgery. These exercises can be done on a training (exercise) mat, on the floor, on a table or on a bed. Use what ever works the best and is most comfortable for you Knee exercises include:  QUAD STRENGTHENING EXERCISES Strengthening Quadriceps Sets  Tighten muscles on top of thigh by pushing knees down into floor or table. Hold for 20 seconds. Repeat 10 times. Do 2 sessions per day.     Strengthening Terminal Knee Extension  With knee bent over bolster, straighten knee by tightening muscle on top of thigh. Be sure to keep bottom of knee on bolster. Hold for 20 seconds. Repeat 10 times. Do 2 sessions per day.   Straight Leg with Bent Knee  Lie on back with opposite leg bent. Keep involved knee slightly bent at knee and raise leg 4-6". Hold for 10 seconds. Repeat 20 times per set. Do 2 sets per session. Do 2 sessions per day                                                                                                                                                                                                                                                                           PATIENT INSTRUCTIONS POST-ANESTHESIA  IMMEDIATELY FOLLOWING SURGERY:  Do not drive or operate machinery for the first twenty four hours after  surgery.  Do not make any important decisions for twenty four hours after surgery or while taking narcotic pain medications or sedatives.  If you develop intractable nausea and vomiting or a severe headache please notify your doctor immediately.

## 2014-08-10 NOTE — Progress Notes (Signed)
Patient has had a recent cold No fever. Occ coughs up clear mucus

## 2014-08-10 NOTE — Op Note (Signed)
Preoperative diagnosis-  Left knee medial meniscal tear  Postoperative diagnosis Left- knee medial meniscal tear plus  Chondral defect medial femoral condyle  Procedure- Left knee arthroscopy with medial  meniscal debridement and chondroplasty   Surgeon- Dione Plover. Daltyn Degroat, MD  Anesthesia-General  EBL-  Minimal  Complications- None  Condition- PACU - hemodynamically stable.  Brief clinical note- -[name] is a 79 y.o.  female with a several month history of left knee pain and mechanical symptoms. Exam and history suggested medial meniscal tear confirmed by MRI. The patient presents now for arthroscopy and debridement   Procedure in detail -       After successful administration of General anesthetic, a tourmiquet is placed high on the Left  thigh and the Left lower extremity is prepped and draped in the usual sterile fashion. Time out is performed by the surgical team. Standard superomedial and inferolateral portal sites are marked and incisions made with an 11 blade. The inflow cannula is passed through the superomedial portal and camera through the inferolateral portal and inflow is initiated. Arthroscopic visualization proceeds.      The undersurface of the patella and trochlea are visualized and there is Grade II chondromalacia patella with no unstable chondral defects. The medial and lateral gutters are visualized and there are  no loose bodies. Flexion and valgus force is applied to the knee and the medial compartment is entered. A spinal needle is passed into the joint through the site marked for the inferomedial portal. A small incision is made and the dilator passed into the joint. The findings for the medial compartment are unstable tear body and posterior horn medial meniscus with 1 x 1 cm chondral defect medial femoral condyle . The tear is debrided to a stable base with baskets and a shaver and sealed off with the Arthrocare. The shaver is used to debride the unstable  cartilage to a stable cartilaginous base with stable edges. It is probed and found to be stable.    The intercondylar notch is visualized and the ACL appears normal. The lateral compartment is entered and the findings are normal .    The joint is again inspected and there are no other tears, defects or loose bodies identified. The arthroscopic equipment is then removed from the inferior portals which are closed with interrupted 4-0 nylon. 20 ml of .25% Marcaine with epinephrine are injected through the inflow cannula and the cannula is then removed and the portal closed with nylon. The incisions are cleaned and dried and a bulky sterile dressing is applied. The patient is then awakened and transported to recovery in stable condition.   08/10/2014, 10:36 AM

## 2014-08-10 NOTE — Anesthesia Postprocedure Evaluation (Signed)
  Anesthesia Post-op Note  Patient: [name]  Procedure(s) Performed: Procedure(s) (LRB): LEFT KNEE ARTHROSCOPY WITH MENSCIAL DEBRIDEMENT CONDROPLASTY (Left)  Patient Location: PACU  Anesthesia Type: General  Level of Consciousness: awake and alert   Airway and Oxygen Therapy: Patient Spontanous Breathing  Post-op Pain: mild  Post-op Assessment: Post-op Vital signs reviewed, Patient's Cardiovascular Status Stable, Respiratory Function Stable, Patent Airway and No signs of Nausea or vomiting  Last Vitals:  Filed Vitals:   08/10/14 1130  BP: 171/73  Pulse: 73  Temp: 36.7 C  Resp: 14    Post-op Vital Signs: stable   Complications: No apparent anesthesia complications

## 2014-08-11 ENCOUNTER — Encounter (HOSPITAL_COMMUNITY): Payer: Self-pay | Admitting: Orthopedic Surgery

## 2014-08-16 DIAGNOSIS — S83232D Complex tear of medial meniscus, current injury, left knee, subsequent encounter: Secondary | ICD-10-CM | POA: Diagnosis not present

## 2014-08-16 DIAGNOSIS — Z4789 Encounter for other orthopedic aftercare: Secondary | ICD-10-CM | POA: Diagnosis not present

## 2014-09-06 DIAGNOSIS — Z4789 Encounter for other orthopedic aftercare: Secondary | ICD-10-CM | POA: Diagnosis not present

## 2014-09-06 DIAGNOSIS — S83232D Complex tear of medial meniscus, current injury, left knee, subsequent encounter: Secondary | ICD-10-CM | POA: Diagnosis not present

## 2014-09-09 ENCOUNTER — Ambulatory Visit
Admission: RE | Admit: 2014-09-09 | Discharge: 2014-09-09 | Disposition: A | Discharge status: Home / Self Care | Payer: Medicare Other | Source: Ambulatory Visit

## 2014-09-09 DIAGNOSIS — Z1231 Encounter for screening mammogram for malignant neoplasm of breast: Secondary | ICD-10-CM | POA: Diagnosis not present

## 2014-09-09 DIAGNOSIS — Z9011 Acquired absence of right breast and nipple: Secondary | ICD-10-CM

## 2014-09-12 ENCOUNTER — Other Ambulatory Visit: Payer: Self-pay | Admitting: Obstetrics and Gynecology

## 2014-09-12 DIAGNOSIS — R928 Other abnormal and inconclusive findings on diagnostic imaging of breast: Secondary | ICD-10-CM

## 2014-09-15 ENCOUNTER — Ambulatory Visit
Admission: RE | Admit: 2014-09-15 | Discharge: 2014-09-15 | Disposition: A | Discharge status: Home / Self Care | Payer: Medicare Other | Source: Ambulatory Visit | Attending: Obstetrics and Gynecology | Admitting: Obstetrics and Gynecology

## 2014-09-15 DIAGNOSIS — R928 Other abnormal and inconclusive findings on diagnostic imaging of breast: Secondary | ICD-10-CM

## 2014-09-15 DIAGNOSIS — N63 Unspecified lump in breast: Secondary | ICD-10-CM | POA: Diagnosis not present

## 2014-10-04 DIAGNOSIS — S83232D Complex tear of medial meniscus, current injury, left knee, subsequent encounter: Secondary | ICD-10-CM | POA: Diagnosis not present

## 2014-10-04 DIAGNOSIS — Z4789 Encounter for other orthopedic aftercare: Secondary | ICD-10-CM | POA: Diagnosis not present

## 2014-10-18 ENCOUNTER — Encounter: Payer: Self-pay | Admitting: Cardiovascular Disease

## 2014-10-18 NOTE — Progress Notes (Signed)
Patient ID: [name], female   DOB: [birth year], 79 y.o.   MRN: [id number] 79 y.o.  white female patient who was initially seen in 2011 for atypical chest pain in setting of pneumonia  Echocardiogram showed LVH with normal LV function ejection fraction 60%. She was scheduled for a stress Myoview as an outpatient which was performed July 18, 2009. This showed normal LV function, normal wall motion, no ischemia, there was brief SVT with stress but no scar or ischemia. She currently has no complaints. I think that the multiple previous cardiac referrals from Dr Shelia Media have mostly been because of a positive family history in both mother and father. Risk stratification with calcium score done 8/14   Calcium score 17. 33rd percentile for age and sex matched  controls. See radiology report regarding f/u for right lung  nodules    Son [name] still doing tile work   ROS: Denies fever, malais, weight loss, blurry vision, decreased visual acuity, cough, sputum, SOB, hemoptysis, pleuritic pain, palpitaitons, heartburn, abdominal pain, melena, lower extremity edema, claudication, or rash.  All other systems reviewed and negative  General: Affect appropriate Healthy:  appears stated age 79: normal Neck supple with no adenopathy JVP normal no bruits no thyromegaly Lungs clear with no wheezing and good diaphragmatic motion Heart:  S1/S2 no murmur, no rub, gallop or click PMI normal Abdomen: benighn, BS positve, no tenderness, no AAA no bruit.  No HSM or HJR Distal pulses intact with no bruits No edema Neuro non-focal Skin warm and dry No muscular weakness   Current Outpatient Prescriptions  Medication Sig Dispense Refill  . aspirin 81 MG tablet Take 81 mg by mouth at bedtime.     . Calcium-Vitamin D (CALTRATE 600 PLUS-VIT D PO) Take 2 tablets by mouth at bedtime.     . Estradiol (VAGIFEM) 10 MCG TABS Place 1 tablet vaginally 3 (three) times a week.     . losartan (COZAAR) 100 MG tablet Take 100  mg by mouth at bedtime.     . Omega-3 Fatty Acids (FISH OIL) 1000 MG CAPS Take 1,000 mg by mouth at bedtime.     Marland Kitchen OVER THE COUNTER MEDICATION Take 2 capsules by mouth at bedtime. Hydro eye.    . risedronate (ACTONEL) 35 MG tablet Take 35 mg by mouth every Monday.     . rosuvastatin (CRESTOR) 20 MG tablet Take 10 mg by mouth at bedtime.     No current facility-administered medications for this visit.    Allergies  Sulfonamide derivatives  Electrocardiogram:   8/14 SR rate 88 normal 2014  Today SR rate 69 PR 222 otherwise normal  10/19/14 SR rate 75 Limb lead reversal  Otherwise normal   Assessment and Plan CAD: Relatively low calcium score for age no symptoms continue asa and statin ECG normal HTN: Well controlled.  Continue current medications and low sodium Dash type diet.   Chol: on statin labs with primary   Fu with me in a year   Jenkins Rouge

## 2014-10-19 ENCOUNTER — Ambulatory Visit (INDEPENDENT_AMBULATORY_CARE_PROVIDER_SITE_OTHER): Payer: Medicare Other | Admitting: Cardiovascular Disease

## 2014-10-19 ENCOUNTER — Encounter: Payer: Self-pay | Admitting: Cardiovascular Disease

## 2014-10-19 VITALS — BP 120/80 | HR 75 | Ht 65.0 in | Wt 174.2 lb

## 2014-10-19 DIAGNOSIS — I440 Atrioventricular block, first degree: Secondary | ICD-10-CM | POA: Diagnosis not present

## 2014-10-19 NOTE — Patient Instructions (Signed)
Medication Instructions:  Your physician recommends that you continue on your current medications as directed. Please refer to the Current Medication list given to you today.   Labwork: NONE Testing/Procedures: NONE  Follow-Up: Your physician wants you to follow-up in: YEAR WITH  DR NISHAN  You will receive a reminder letter in the mail two months in advance. If you don't receive a letter, please call our office to schedule the follow-up appointment.  Any Other Special Instructions Will Be Listed Below (If Applicable).   

## 2014-10-26 DIAGNOSIS — Z0000 Encounter for general adult medical examination without abnormal findings: Secondary | ICD-10-CM | POA: Diagnosis not present

## 2014-10-26 DIAGNOSIS — E7800 Pure hypercholesterolemia, unspecified: Secondary | ICD-10-CM | POA: Diagnosis not present

## 2014-10-26 DIAGNOSIS — N390 Urinary tract infection, site not specified: Secondary | ICD-10-CM | POA: Diagnosis not present

## 2014-10-26 DIAGNOSIS — Z23 Encounter for immunization: Secondary | ICD-10-CM | POA: Diagnosis not present

## 2014-10-27 DIAGNOSIS — M810 Age-related osteoporosis without current pathological fracture: Secondary | ICD-10-CM | POA: Diagnosis not present

## 2014-11-02 DIAGNOSIS — Z807 Family history of other malignant neoplasms of lymphoid, hematopoietic and related tissues: Secondary | ICD-10-CM | POA: Diagnosis not present

## 2014-11-02 DIAGNOSIS — I10 Essential (primary) hypertension: Secondary | ICD-10-CM | POA: Diagnosis not present

## 2014-11-02 DIAGNOSIS — R600 Localized edema: Secondary | ICD-10-CM | POA: Diagnosis not present

## 2014-11-02 DIAGNOSIS — E7800 Pure hypercholesterolemia, unspecified: Secondary | ICD-10-CM | POA: Diagnosis not present

## 2014-11-30 DIAGNOSIS — R350 Frequency of micturition: Secondary | ICD-10-CM | POA: Diagnosis not present

## 2014-11-30 DIAGNOSIS — L900 Lichen sclerosus et atrophicus: Secondary | ICD-10-CM | POA: Diagnosis not present

## 2014-12-19 DIAGNOSIS — R350 Frequency of micturition: Secondary | ICD-10-CM | POA: Diagnosis not present

## 2015-01-05 DIAGNOSIS — N390 Urinary tract infection, site not specified: Secondary | ICD-10-CM | POA: Diagnosis not present

## 2015-03-20 DIAGNOSIS — R350 Frequency of micturition: Secondary | ICD-10-CM | POA: Diagnosis not present

## 2015-04-26 DIAGNOSIS — R5383 Other fatigue: Secondary | ICD-10-CM | POA: Diagnosis not present

## 2015-04-26 DIAGNOSIS — E559 Vitamin D deficiency, unspecified: Secondary | ICD-10-CM | POA: Diagnosis not present

## 2015-04-26 DIAGNOSIS — M65321 Trigger finger, right index finger: Secondary | ICD-10-CM | POA: Diagnosis not present

## 2015-04-26 DIAGNOSIS — Z79899 Other long term (current) drug therapy: Secondary | ICD-10-CM | POA: Diagnosis not present

## 2015-04-26 DIAGNOSIS — M810 Age-related osteoporosis without current pathological fracture: Secondary | ICD-10-CM | POA: Diagnosis not present

## 2015-05-01 DIAGNOSIS — H903 Sensorineural hearing loss, bilateral: Secondary | ICD-10-CM | POA: Diagnosis not present

## 2015-05-28 DIAGNOSIS — N3001 Acute cystitis with hematuria: Secondary | ICD-10-CM | POA: Diagnosis not present

## 2015-06-02 DIAGNOSIS — Z1389 Encounter for screening for other disorder: Secondary | ICD-10-CM | POA: Diagnosis not present

## 2015-06-02 DIAGNOSIS — L900 Lichen sclerosus et atrophicus: Secondary | ICD-10-CM | POA: Diagnosis not present

## 2015-06-02 DIAGNOSIS — Z1279 Encounter for screening for malignant neoplasm of other genitourinary organs: Secondary | ICD-10-CM | POA: Diagnosis not present

## 2015-06-02 DIAGNOSIS — Z01419 Encounter for gynecological examination (general) (routine) without abnormal findings: Secondary | ICD-10-CM | POA: Diagnosis not present

## 2015-06-02 DIAGNOSIS — N9411 Superficial (introital) dyspareunia: Secondary | ICD-10-CM | POA: Diagnosis not present

## 2015-06-28 DIAGNOSIS — R600 Localized edema: Secondary | ICD-10-CM | POA: Diagnosis not present

## 2015-06-28 DIAGNOSIS — Z79899 Other long term (current) drug therapy: Secondary | ICD-10-CM | POA: Diagnosis not present

## 2015-07-14 DIAGNOSIS — M7061 Trochanteric bursitis, right hip: Secondary | ICD-10-CM | POA: Diagnosis not present

## 2015-08-02 DIAGNOSIS — L900 Lichen sclerosus et atrophicus: Secondary | ICD-10-CM | POA: Diagnosis not present

## 2015-08-03 DIAGNOSIS — M7061 Trochanteric bursitis, right hip: Secondary | ICD-10-CM | POA: Diagnosis not present

## 2015-08-07 ENCOUNTER — Encounter: Payer: Self-pay | Admitting: Cardiovascular Disease

## 2015-08-08 DIAGNOSIS — H35033 Hypertensive retinopathy, bilateral: Secondary | ICD-10-CM | POA: Diagnosis not present

## 2015-08-08 DIAGNOSIS — H5213 Myopia, bilateral: Secondary | ICD-10-CM | POA: Diagnosis not present

## 2015-08-08 DIAGNOSIS — H524 Presbyopia: Secondary | ICD-10-CM | POA: Diagnosis not present

## 2015-08-08 DIAGNOSIS — H52223 Regular astigmatism, bilateral: Secondary | ICD-10-CM | POA: Diagnosis not present

## 2015-08-08 DIAGNOSIS — H04123 Dry eye syndrome of bilateral lacrimal glands: Secondary | ICD-10-CM | POA: Diagnosis not present

## 2015-08-09 DIAGNOSIS — M7061 Trochanteric bursitis, right hip: Secondary | ICD-10-CM | POA: Diagnosis not present

## 2015-08-10 ENCOUNTER — Other Ambulatory Visit: Payer: Self-pay | Admitting: Obstetrics and Gynecology

## 2015-08-10 DIAGNOSIS — Z139 Encounter for screening, unspecified: Secondary | ICD-10-CM

## 2015-08-17 DIAGNOSIS — M7061 Trochanteric bursitis, right hip: Secondary | ICD-10-CM | POA: Diagnosis not present

## 2015-09-18 ENCOUNTER — Other Ambulatory Visit: Payer: Self-pay

## 2015-09-19 ENCOUNTER — Ambulatory Visit: Payer: Medicare Other

## 2015-10-04 ENCOUNTER — Ambulatory Visit
Admission: RE | Admit: 2015-10-04 | Discharge: 2015-10-04 | Disposition: A | Discharge status: Home / Self Care | Payer: Medicare Other | Source: Ambulatory Visit | Attending: Obstetrics and Gynecology | Admitting: Obstetrics and Gynecology

## 2015-10-04 ENCOUNTER — Other Ambulatory Visit: Payer: Self-pay | Admitting: Obstetrics and Gynecology

## 2015-10-04 DIAGNOSIS — Z139 Encounter for screening, unspecified: Secondary | ICD-10-CM

## 2015-10-04 DIAGNOSIS — Z9011 Acquired absence of right breast and nipple: Secondary | ICD-10-CM

## 2015-10-04 DIAGNOSIS — Z1231 Encounter for screening mammogram for malignant neoplasm of breast: Secondary | ICD-10-CM | POA: Diagnosis not present

## 2015-10-07 DIAGNOSIS — N390 Urinary tract infection, site not specified: Secondary | ICD-10-CM | POA: Diagnosis not present

## 2015-10-10 ENCOUNTER — Encounter: Payer: Self-pay | Admitting: Cardiovascular Disease

## 2015-10-17 NOTE — Progress Notes (Signed)
Patient ID: [name], female   DOB: 17-Feb-1935, 80 y.o.   MRN: [id number] 80 y.o.  white female patient who was initially seen in 2011 for atypical chest pain in setting of pneumonia  Echocardiogram showed LVH with normal LV function ejection fraction 60%. She was scheduled for a stress Myoview as an outpatient which was performed July 18, 2009. This showed normal LV function, normal wall motion, no ischemia, there was brief SVT with stress but no scar or ischemia. She currently has no complaints. I think that the multiple previous cardiac referrals from Dr Shelia Media have mostly been because of a positive family history in both mother and father. Risk stratification with calcium score done 8/14   Calcium score 17. 33rd percentile for age and sex matched  controls. See radiology report regarding f/u for right lung  nodules  Son [name] still doing tile work   ROS: Denies fever, malais, weight loss, blurry vision, decreased visual acuity, cough, sputum, SOB, hemoptysis, pleuritic pain, palpitaitons, heartburn, abdominal pain, melena, lower extremity edema, claudication, or rash.  All other systems reviewed and negative  General: Affect appropriate Healthy:  appears stated age 80: normal Neck supple with no adenopathy JVP normal no bruits no thyromegaly Lungs clear with no wheezing and good diaphragmatic motion Heart:  S1/S2 no murmur, no rub, gallop or click PMI normal Abdomen: benighn, BS positve, no tenderness, no AAA no bruit.  No HSM or HJR Distal pulses intact with no bruits No edema Neuro non-focal Skin warm and dry No muscular weakness   Current Outpatient Prescriptions  Medication Sig Dispense Refill  . aspirin 81 MG tablet Take 81 mg by mouth at bedtime.     . Calcium-Vitamin D (CALTRATE 600 PLUS-VIT D PO) Take 2 tablets by mouth at bedtime.     . Estradiol (VAGIFEM) 10 MCG TABS Place 1 tablet vaginally 3 (three) times a week.     . losartan (COZAAR) 100 MG tablet Take 100 mg  by mouth at bedtime.     . Omega-3 Fatty Acids (FISH OIL) 1000 MG CAPS Take 1,000 mg by mouth at bedtime.     Marland Kitchen OVER THE COUNTER MEDICATION Take 2 capsules by mouth at bedtime. Hydro eye.    . risedronate (ACTONEL) 35 MG tablet Take 35 mg by mouth every Monday.     . rosuvastatin (CRESTOR) 20 MG tablet Take 10 mg by mouth at bedtime.     No current facility-administered medications for this visit.     Allergies  Sulfonamide derivatives  Electrocardiogram:   8/14 SR rate 88 normal 2014  Today SR rate 69 PR 222 otherwise normal  10/19/14 SR rate 75 Limb lead reversal  Otherwise normal   10/23/15  SR rate 74 inferior lateral ST Changes   Assessment and Plan CAD: Relatively low calcium score for age 35 years ago no symptoms continue asa and statin ECG normal  Will repeat calcium score make sure we are doing all we can to keep from having MI  HTN: Well controlled.  Continue current medications and low sodium Dash type diet.    Chol: on statin labs with primary intensify Rx if calcium score higher   Fu with me in a year   Jenkins Rouge

## 2015-10-20 ENCOUNTER — Ambulatory Visit: Payer: Medicare Other | Admitting: Cardiovascular Disease

## 2015-10-23 ENCOUNTER — Ambulatory Visit (INDEPENDENT_AMBULATORY_CARE_PROVIDER_SITE_OTHER): Payer: Medicare Other | Admitting: Cardiovascular Disease

## 2015-10-23 ENCOUNTER — Encounter: Payer: Self-pay | Admitting: Cardiovascular Disease

## 2015-10-23 ENCOUNTER — Ambulatory Visit (INDEPENDENT_AMBULATORY_CARE_PROVIDER_SITE_OTHER)
Admission: RE | Admit: 2015-10-23 | Discharge: 2015-10-23 | Disposition: A | Discharge status: Home / Self Care | Payer: Medicare Other | Source: Ambulatory Visit | Attending: Cardiovascular Disease | Admitting: Cardiovascular Disease

## 2015-10-23 VITALS — BP 130/80 | HR 75 | Ht 65.5 in | Wt 172.8 lb

## 2015-10-23 DIAGNOSIS — I440 Atrioventricular block, first degree: Secondary | ICD-10-CM

## 2015-10-23 NOTE — Patient Instructions (Signed)
Medication Instructions:  Your physician recommends that you continue on your current medications as directed. Please refer to the Current Medication list given to you today.  Labwork: NONE  Testing/Procedures: Cardiac CT scanning Ca Score, (CAT scanning), is a noninvasive, special x-ray that produces cross-sectional images of the body using x-rays and a computer.   Follow-Up: Your physician wants you to follow-up in: 12 months with Dr. Johnsie Cancel. You will receive a reminder letter in the mail two months in advance. If you don't receive a letter, please call our office to schedule the follow-up appointment.   If you need a refill on your cardiac medications before your next appointment, please call your pharmacy.

## 2015-10-25 ENCOUNTER — Telehealth: Payer: Self-pay | Admitting: Cardiovascular Disease

## 2015-10-25 NOTE — Telephone Encounter (Signed)
New Message  Pt call requesting to speak with RN. Pt stats she received a call from Dr. Kyla Balzarine RN. Please call back to discuss

## 2015-10-25 NOTE — Telephone Encounter (Signed)
Notes Recorded by Josue Hector, MD on 10/23/2015 at 12:01 PM EDT Calcium score 72 up from 17 in 2014 but still pretty low continue current meds   Called patient with Ca Score results as written above. Patient verbalized understanding.

## 2015-11-03 DIAGNOSIS — N390 Urinary tract infection, site not specified: Secondary | ICD-10-CM | POA: Diagnosis not present

## 2015-11-03 DIAGNOSIS — Z23 Encounter for immunization: Secondary | ICD-10-CM | POA: Diagnosis not present

## 2015-11-03 DIAGNOSIS — M810 Age-related osteoporosis without current pathological fracture: Secondary | ICD-10-CM | POA: Diagnosis not present

## 2015-11-03 DIAGNOSIS — I10 Essential (primary) hypertension: Secondary | ICD-10-CM | POA: Diagnosis not present

## 2015-11-03 DIAGNOSIS — E2839 Other primary ovarian failure: Secondary | ICD-10-CM | POA: Diagnosis not present

## 2015-11-03 DIAGNOSIS — E7800 Pure hypercholesterolemia, unspecified: Secondary | ICD-10-CM | POA: Diagnosis not present

## 2015-11-06 DIAGNOSIS — Z0000 Encounter for general adult medical examination without abnormal findings: Secondary | ICD-10-CM | POA: Diagnosis not present

## 2015-11-10 DIAGNOSIS — M810 Age-related osteoporosis without current pathological fracture: Secondary | ICD-10-CM | POA: Diagnosis not present

## 2015-11-10 DIAGNOSIS — E7800 Pure hypercholesterolemia, unspecified: Secondary | ICD-10-CM | POA: Diagnosis not present

## 2015-11-10 DIAGNOSIS — L719 Rosacea, unspecified: Secondary | ICD-10-CM | POA: Diagnosis not present

## 2015-11-10 DIAGNOSIS — I10 Essential (primary) hypertension: Secondary | ICD-10-CM | POA: Diagnosis not present

## 2015-11-10 DIAGNOSIS — R7303 Prediabetes: Secondary | ICD-10-CM | POA: Diagnosis not present

## 2015-11-20 DIAGNOSIS — M810 Age-related osteoporosis without current pathological fracture: Secondary | ICD-10-CM | POA: Diagnosis not present

## 2015-11-28 DIAGNOSIS — Z1212 Encounter for screening for malignant neoplasm of rectum: Secondary | ICD-10-CM | POA: Diagnosis not present

## 2015-12-21 DIAGNOSIS — R300 Dysuria: Secondary | ICD-10-CM | POA: Diagnosis not present

## 2015-12-21 DIAGNOSIS — N390 Urinary tract infection, site not specified: Secondary | ICD-10-CM | POA: Diagnosis not present

## 2016-02-26 DIAGNOSIS — R159 Full incontinence of feces: Secondary | ICD-10-CM | POA: Diagnosis not present

## 2016-03-25 DIAGNOSIS — K625 Hemorrhage of anus and rectum: Secondary | ICD-10-CM | POA: Diagnosis not present

## 2016-03-25 DIAGNOSIS — R159 Full incontinence of feces: Secondary | ICD-10-CM | POA: Diagnosis not present

## 2016-04-12 DIAGNOSIS — L570 Actinic keratosis: Secondary | ICD-10-CM | POA: Diagnosis not present

## 2016-04-12 DIAGNOSIS — D0462 Carcinoma in situ of skin of left upper limb, including shoulder: Secondary | ICD-10-CM | POA: Diagnosis not present

## 2016-04-12 DIAGNOSIS — L821 Other seborrheic keratosis: Secondary | ICD-10-CM | POA: Diagnosis not present

## 2016-04-12 DIAGNOSIS — D1801 Hemangioma of skin and subcutaneous tissue: Secondary | ICD-10-CM | POA: Diagnosis not present

## 2016-04-12 DIAGNOSIS — D485 Neoplasm of uncertain behavior of skin: Secondary | ICD-10-CM | POA: Diagnosis not present

## 2016-04-12 DIAGNOSIS — L812 Freckles: Secondary | ICD-10-CM | POA: Diagnosis not present

## 2016-04-25 DIAGNOSIS — M1990 Unspecified osteoarthritis, unspecified site: Secondary | ICD-10-CM | POA: Diagnosis not present

## 2016-04-25 DIAGNOSIS — M810 Age-related osteoporosis without current pathological fracture: Secondary | ICD-10-CM | POA: Diagnosis not present

## 2016-04-25 DIAGNOSIS — R5383 Other fatigue: Secondary | ICD-10-CM | POA: Diagnosis not present

## 2016-04-25 DIAGNOSIS — E559 Vitamin D deficiency, unspecified: Secondary | ICD-10-CM | POA: Diagnosis not present

## 2016-05-10 DIAGNOSIS — N390 Urinary tract infection, site not specified: Secondary | ICD-10-CM | POA: Diagnosis not present

## 2016-05-10 DIAGNOSIS — R300 Dysuria: Secondary | ICD-10-CM | POA: Diagnosis not present

## 2016-05-21 DIAGNOSIS — M810 Age-related osteoporosis without current pathological fracture: Secondary | ICD-10-CM | POA: Diagnosis not present

## 2016-07-15 DIAGNOSIS — M545 Low back pain: Secondary | ICD-10-CM | POA: Diagnosis not present

## 2016-07-15 DIAGNOSIS — N390 Urinary tract infection, site not specified: Secondary | ICD-10-CM | POA: Diagnosis not present

## 2016-08-16 DIAGNOSIS — R300 Dysuria: Secondary | ICD-10-CM | POA: Diagnosis not present

## 2016-08-16 DIAGNOSIS — N390 Urinary tract infection, site not specified: Secondary | ICD-10-CM | POA: Diagnosis not present

## 2016-09-02 DIAGNOSIS — N3941 Urge incontinence: Secondary | ICD-10-CM | POA: Diagnosis not present

## 2016-09-02 DIAGNOSIS — N941 Unspecified dyspareunia: Secondary | ICD-10-CM | POA: Diagnosis not present

## 2016-09-02 DIAGNOSIS — R350 Frequency of micturition: Secondary | ICD-10-CM | POA: Diagnosis not present

## 2016-09-30 DIAGNOSIS — H04123 Dry eye syndrome of bilateral lacrimal glands: Secondary | ICD-10-CM | POA: Diagnosis not present

## 2016-09-30 DIAGNOSIS — H35033 Hypertensive retinopathy, bilateral: Secondary | ICD-10-CM | POA: Diagnosis not present

## 2016-09-30 DIAGNOSIS — H524 Presbyopia: Secondary | ICD-10-CM | POA: Diagnosis not present

## 2016-09-30 DIAGNOSIS — H52223 Regular astigmatism, bilateral: Secondary | ICD-10-CM | POA: Diagnosis not present

## 2016-09-30 DIAGNOSIS — H5213 Myopia, bilateral: Secondary | ICD-10-CM | POA: Diagnosis not present

## 2016-10-18 ENCOUNTER — Encounter: Payer: Self-pay | Admitting: Cardiovascular Disease

## 2016-10-21 DIAGNOSIS — L814 Other melanin hyperpigmentation: Secondary | ICD-10-CM | POA: Diagnosis not present

## 2016-10-21 DIAGNOSIS — Z85828 Personal history of other malignant neoplasm of skin: Secondary | ICD-10-CM | POA: Diagnosis not present

## 2016-10-21 DIAGNOSIS — L570 Actinic keratosis: Secondary | ICD-10-CM | POA: Diagnosis not present

## 2016-10-21 DIAGNOSIS — I8391 Asymptomatic varicose veins of right lower extremity: Secondary | ICD-10-CM | POA: Diagnosis not present

## 2016-10-21 DIAGNOSIS — D1801 Hemangioma of skin and subcutaneous tissue: Secondary | ICD-10-CM | POA: Diagnosis not present

## 2016-10-21 DIAGNOSIS — L821 Other seborrheic keratosis: Secondary | ICD-10-CM | POA: Diagnosis not present

## 2016-10-30 ENCOUNTER — Ambulatory Visit: Payer: Medicare Other | Admitting: Cardiovascular Disease

## 2016-11-04 DIAGNOSIS — L900 Lichen sclerosus et atrophicus: Secondary | ICD-10-CM | POA: Diagnosis not present

## 2016-11-04 DIAGNOSIS — Z1231 Encounter for screening mammogram for malignant neoplasm of breast: Secondary | ICD-10-CM | POA: Diagnosis not present

## 2016-11-15 DIAGNOSIS — I10 Essential (primary) hypertension: Secondary | ICD-10-CM | POA: Diagnosis not present

## 2016-11-15 DIAGNOSIS — Z0000 Encounter for general adult medical examination without abnormal findings: Secondary | ICD-10-CM | POA: Diagnosis not present

## 2016-11-15 DIAGNOSIS — N390 Urinary tract infection, site not specified: Secondary | ICD-10-CM | POA: Diagnosis not present

## 2016-11-15 DIAGNOSIS — M810 Age-related osteoporosis without current pathological fracture: Secondary | ICD-10-CM | POA: Diagnosis not present

## 2016-11-15 DIAGNOSIS — Z23 Encounter for immunization: Secondary | ICD-10-CM | POA: Diagnosis not present

## 2016-11-15 DIAGNOSIS — E7800 Pure hypercholesterolemia, unspecified: Secondary | ICD-10-CM | POA: Diagnosis not present

## 2016-11-17 NOTE — Progress Notes (Signed)
Cardiology Office Note   Date:  11/18/2016   ID:  [name], DOB Dec 28, 1935, MRN [id number]  PCP:  Cynthia Pretty, MD  Cardiologist:  Dr. Johnsie Cancel    No chief complaint on file.     History of Present Illness: [name] is a 81 y.o. female who presents for    initially seen in 2011 for atypical chest pain in setting of pneumonia.  Echocardiogram showed LVH with normal LV function ejection fraction 60%. She was scheduled for a stress Myoview as an outpatient which was performed July 18, 2009. This showed normal LV function, normal wall motion, no ischemia, there was brief SVT with stress but no scar or ischemia.  Calcium score 17. 33rd percentile for age and sex matched controls. See radiology report regarding f/u for right lung  Nodules.  Today she has no complaints.  No chest pain some mild SOB breathlessness with walking her dog but she does not have to stop walking.  She is eating healthy.  She is to see Dr. Shelia Porter this week for lipid check it was well controlled last year.  We reviewed what to look for if cardiac issues.     Past Medical History:  Diagnosis Date  . Acne rosacea   . Arthritis   . Breast cancer [facility])    Has had a prior right right mastectomy and chemotherapy  . Cardiomegaly    Normal echocardiogram and cardiac workup by Dr. Johnsie Cancel  . Chest pain, unspecified   . Hyperlipidemia   . Hypertension   . Irritable bladder   . Lymphedema of arm    Right, post lumph node infection  . Osteopenia   . Pneumonia    5 years ago  . Polyp of gallbladder   . Thyroid nodule     Past Surgical History:  Procedure Laterality Date  . ABDOMINAL HYSTERECTOMY    . EYE SURGERY     cataract surgery bilateral  . KNEE ARTHROSCOPY Left 08/10/2014   Procedure: LEFT KNEE ARTHROSCOPY WITH MENSCIAL DEBRIDEMENT CONDROPLASTY;  Surgeon: Cynthia Arabian, MD;  Location: [facility];  Service: Orthopedics;  Laterality: Left;  Marland Kitchen MASTECTOMY     right breast  . TONSILLECTOMY    .  VESICOVAGINAL FISTULA CLOSURE W/ TAH       Current Outpatient Prescriptions  Medication Sig Dispense Refill  . aspirin 81 MG tablet Take 81 mg by mouth at bedtime.     . Calcium-Vitamin D (CALTRATE 600 PLUS-VIT D PO) Take 2 tablets by mouth at bedtime.     . clobetasol ointment (TEMOVATE) 4.62 % Apply 1 application topically 2 (two) times daily as needed. (dryness)    . denosumab (PROLIA) 60 MG/ML SOLN injection Inject 60 mg/mL into the skin every six (6) months.    . Estradiol (VAGIFEM) 10 MCG TABS Place 1 tablet vaginally 3 (three) times a week.     . losartan (COZAAR) 100 MG tablet Take 100 mg by mouth at bedtime.     . Omega-3 Fatty Acids (FISH OIL) 1000 MG CAPS Take 1,000 mg by mouth at bedtime.     Marland Kitchen OVER THE COUNTER MEDICATION Take 2 capsules by mouth at bedtime. Hydro eye.    . rosuvastatin (CRESTOR) 20 MG tablet Take 10 mg by mouth at bedtime.     No current facility-administered medications for this visit.     Allergies:   Sulfonamide derivatives    Social History:  The patient  reports that she has never smoked. She has never  used smokeless tobacco. She reports that she does not drink alcohol or use drugs.   Family History:  The patient's family history includes Congestive Heart Failure in her mother; Coronary artery disease in her unknown relative; Heart attack (age of onset: 44) in her father; Rheum arthritis in her mother.    ROS:  General:no colds or fevers, no weight changes Skin:no rashes or ulcers HEENT:no blurred vision, no congestion CV:see HPI PUL:see HPI GI:no diarrhea constipation or melena, no indigestion GU:no hematuria, no dysuria MS:no joint pain, no claudication Neuro:no syncope, no lightheadedness Endo:no diabetes, no thyroid disease She has had her flu vaccine  Wt Readings from Last 3 Encounters:  11/18/16 173 lb 12.8 oz (78.8 kg)  10/23/15 172 lb 12.8 oz (78.4 kg)  10/19/14 174 lb 3.2 oz (79 kg)     PHYSICAL EXAM: VS:  BP 136/80 (BP  Location: Left Arm)   Pulse 73   Ht 5\' 5"  (1.651 m)   Wt 173 lb 12.8 oz (78.8 kg)   BMI 28.92 kg/m  , BMI Body mass index is 28.92 kg/m. General:Pleasant affect, NAD Skin:Warm and dry, brisk capillary refill HEENT:normocephalic, sclera clear, mucus membranes moist Neck:supple, no JVD, no bruits  Heart:S1S2 RRR without murmur, gallup, rub or click Lungs:clear without rales, rhonchi, or wheezes ZWC:HENI, non tender, + BS, do not palpate liver spleen or masses Ext:no lower ext edema, 2+ pedal pulses, 2+ radial pulses Neuro:alert and oriented X 3, MAE, follows commands, + facial symmetry    EKG:  EKG is ordered today. The ekg ordered today demonstrates SR with 1st degree AV block no changes from last year.   Recent Labs: No results found for requested labs within last 8760 hours.    Lipid Panel    Component Value Date/Time   CHOL  07/10/2009 0340    130        ATP III CLASSIFICATION:  <200     mg/dL   Desirable  200-239  mg/dL   Borderline High  >=240    mg/dL   High          TRIG 161 (H) 07/10/2009 0340   HDL 43 07/10/2009 0340   CHOLHDL 3.0 07/10/2009 0340   VLDL 32 07/10/2009 0340   LDLCALC  07/10/2009 0340    55        Total Cholesterol/HDL:CHD Risk Coronary Heart Disease Risk Table                     Men   Women  1/2 Average Risk   3.4   3.3  Average Risk       5.0   4.4  2 X Average Risk   9.6   7.1  3 X Average Risk  23.4   11.0        Use the calculated Patient Ratio above and the CHD Risk Table to determine the patient's CHD Risk.        ATP III CLASSIFICATION (LDL):  <100     mg/dL   Optimal  100-129  mg/dL   Near or Above                    Optimal  130-159  mg/dL   Borderline  160-189  mg/dL   High  >190     mg/dL   Very High       Other studies Reviewed: Additional studies/ records that were reviewed today include: see above.   ASSESSMENT AND PLAN:  1.  CAD  Relatively low calcium score 4 years ago without angina and on statin.  PCP to  check labs.  + FH of CAD  Follow up in 1 year with Dr. Johnsie Cancel but to call if problems prior to that time  2.  HTN controlled continue current meds  3.  HLD controlled and to have labs Wed with PCP.    Current medicines are reviewed with the patient today.  The patient Has no concerns regarding medicines.  The following changes have been made:  See above Labs/ tests ordered today include:see above  Disposition:   FU:  see above  Signed, Cecilie Kicks, NP  11/18/2016 11:17 AM    [address], [place], [place], [place] Phone: [phone]; Fax: [phone]

## 2016-11-18 ENCOUNTER — Encounter: Payer: Self-pay | Admitting: Cardiology

## 2016-11-18 ENCOUNTER — Ambulatory Visit (INDEPENDENT_AMBULATORY_CARE_PROVIDER_SITE_OTHER): Payer: Medicare Other | Admitting: Cardiology

## 2016-11-18 VITALS — BP 136/80 | HR 73 | Ht 65.0 in | Wt 173.8 lb

## 2016-11-18 DIAGNOSIS — I2510 Atherosclerotic heart disease of native coronary artery without angina pectoris: Secondary | ICD-10-CM | POA: Diagnosis not present

## 2016-11-18 DIAGNOSIS — E782 Mixed hyperlipidemia: Secondary | ICD-10-CM | POA: Diagnosis not present

## 2016-11-18 DIAGNOSIS — I10 Essential (primary) hypertension: Secondary | ICD-10-CM | POA: Diagnosis not present

## 2016-11-18 NOTE — Patient Instructions (Signed)
Medication Instructions:  Your physician recommends that you continue on your current medications as directed. Please refer to the Current Medication list given to you today.  Labwork: None  Testing/Procedures: None  Follow-Up: Your physician wants you to follow-up in: 1 year with Dr. Johnsie Cancel.  You will receive a reminder letter in the mail two months in advance. If you don't receive a letter, please call our office to schedule the follow-up appointment.   Any Other Special Instructions Will Be Listed Below (If Applicable).     If you need a refill on your cardiac medications before your next appointment, please call your pharmacy.

## 2016-11-20 DIAGNOSIS — L719 Rosacea, unspecified: Secondary | ICD-10-CM | POA: Diagnosis not present

## 2016-11-20 DIAGNOSIS — R600 Localized edema: Secondary | ICD-10-CM | POA: Diagnosis not present

## 2016-11-20 DIAGNOSIS — L439 Lichen planus, unspecified: Secondary | ICD-10-CM | POA: Diagnosis not present

## 2016-11-20 DIAGNOSIS — I890 Lymphedema, not elsewhere classified: Secondary | ICD-10-CM | POA: Diagnosis not present

## 2016-11-20 DIAGNOSIS — I10 Essential (primary) hypertension: Secondary | ICD-10-CM | POA: Diagnosis not present

## 2016-11-20 DIAGNOSIS — Z7982 Long term (current) use of aspirin: Secondary | ICD-10-CM | POA: Diagnosis not present

## 2016-11-20 DIAGNOSIS — M810 Age-related osteoporosis without current pathological fracture: Secondary | ICD-10-CM | POA: Diagnosis not present

## 2016-11-20 DIAGNOSIS — R7309 Other abnormal glucose: Secondary | ICD-10-CM | POA: Diagnosis not present

## 2016-11-20 DIAGNOSIS — E041 Nontoxic single thyroid nodule: Secondary | ICD-10-CM | POA: Diagnosis not present

## 2016-11-20 DIAGNOSIS — R1011 Right upper quadrant pain: Secondary | ICD-10-CM | POA: Diagnosis not present

## 2016-11-20 DIAGNOSIS — I440 Atrioventricular block, first degree: Secondary | ICD-10-CM | POA: Diagnosis not present

## 2016-11-20 DIAGNOSIS — E7800 Pure hypercholesterolemia, unspecified: Secondary | ICD-10-CM | POA: Diagnosis not present

## 2016-11-21 DIAGNOSIS — K824 Cholesterolosis of gallbladder: Secondary | ICD-10-CM | POA: Diagnosis not present

## 2016-11-21 DIAGNOSIS — R1011 Right upper quadrant pain: Secondary | ICD-10-CM | POA: Diagnosis not present

## 2016-11-21 DIAGNOSIS — K8020 Calculus of gallbladder without cholecystitis without obstruction: Secondary | ICD-10-CM | POA: Diagnosis not present

## 2016-11-25 DIAGNOSIS — M810 Age-related osteoporosis without current pathological fracture: Secondary | ICD-10-CM | POA: Diagnosis not present

## 2016-11-27 DIAGNOSIS — R1011 Right upper quadrant pain: Secondary | ICD-10-CM | POA: Diagnosis not present

## 2016-11-27 DIAGNOSIS — K8020 Calculus of gallbladder without cholecystitis without obstruction: Secondary | ICD-10-CM | POA: Diagnosis not present

## 2016-12-20 DIAGNOSIS — N390 Urinary tract infection, site not specified: Secondary | ICD-10-CM | POA: Diagnosis not present

## 2016-12-20 DIAGNOSIS — R3915 Urgency of urination: Secondary | ICD-10-CM | POA: Diagnosis not present

## 2016-12-20 DIAGNOSIS — I10 Essential (primary) hypertension: Secondary | ICD-10-CM | POA: Diagnosis not present

## 2016-12-24 DIAGNOSIS — I10 Essential (primary) hypertension: Secondary | ICD-10-CM | POA: Diagnosis not present

## 2016-12-25 ENCOUNTER — Ambulatory Visit: Payer: Self-pay | Admitting: Surgery

## 2016-12-25 DIAGNOSIS — K829 Disease of gallbladder, unspecified: Secondary | ICD-10-CM | POA: Diagnosis not present

## 2017-04-04 DIAGNOSIS — L900 Lichen sclerosus et atrophicus: Secondary | ICD-10-CM | POA: Diagnosis not present

## 2017-04-16 ENCOUNTER — Encounter (HOSPITAL_COMMUNITY): Payer: Self-pay | Admitting: *Deleted

## 2017-04-16 ENCOUNTER — Ambulatory Visit: Payer: Self-pay | Admitting: Surgery

## 2017-04-16 DIAGNOSIS — K829 Disease of gallbladder, unspecified: Secondary | ICD-10-CM | POA: Diagnosis not present

## 2017-04-16 DIAGNOSIS — Z853 Personal history of malignant neoplasm of breast: Secondary | ICD-10-CM | POA: Diagnosis not present

## 2017-04-28 DIAGNOSIS — N390 Urinary tract infection, site not specified: Secondary | ICD-10-CM | POA: Diagnosis not present

## 2017-04-28 DIAGNOSIS — R300 Dysuria: Secondary | ICD-10-CM | POA: Diagnosis not present

## 2017-04-28 DIAGNOSIS — R358 Other polyuria: Secondary | ICD-10-CM | POA: Diagnosis not present

## 2017-04-29 DIAGNOSIS — M79643 Pain in unspecified hand: Secondary | ICD-10-CM | POA: Diagnosis not present

## 2017-04-29 DIAGNOSIS — M1990 Unspecified osteoarthritis, unspecified site: Secondary | ICD-10-CM | POA: Diagnosis not present

## 2017-04-29 DIAGNOSIS — R5383 Other fatigue: Secondary | ICD-10-CM | POA: Diagnosis not present

## 2017-04-29 DIAGNOSIS — E559 Vitamin D deficiency, unspecified: Secondary | ICD-10-CM | POA: Diagnosis not present

## 2017-04-29 DIAGNOSIS — M810 Age-related osteoporosis without current pathological fracture: Secondary | ICD-10-CM | POA: Diagnosis not present

## 2017-05-02 NOTE — Patient Instructions (Addendum)
[name]  05/02/2017   Your procedure is scheduled on: Thursday 05/08/2017  Report to [facility] Main  Entrance              Report to admitting at  0800  AM    Call this number if you have problems the morning of surgery [phone]     Remember: Do not eat food :After Midnight.   NO SOLID FOOD AFTER MIDNIGHT THE NIGHT PRIOR TO SURGERY. NOTHING BY MOUTH EXCEPT CLEAR LIQUIDS UNTIL 3 HOURS PRIOR TO [place] SURGERY. PLEASE FINISH ENSURE DRINK PER SURGEON ORDER 3 HOURS PRIOR TO SCHEDULED SURGERY TIME WHICH NEEDS TO BE COMPLETED AT  0700 am.    CLEAR LIQUID DIET   Foods Allowed                                                                     Foods Excluded  Coffee and tea, regular and decaf                             liquids that you cannot  Plain Jell-O in any flavor                                             see through such as: Fruit ices (not with fruit pulp)                                     milk, soups, orange juice  Iced Popsicles                                    All solid food Carbonated beverages, regular and diet                                    Cranberry, grape and apple juices Sports drinks like Gatorade Lightly seasoned clear broth or consume(fat free) Sugar, honey syrup  Sample Menu Breakfast                                Lunch                                     Supper Cranberry juice                    Beef broth                            Chicken broth Jell-O  Grape juice                           Apple juice Coffee or tea                        Jell-O                                      Popsicle                                                Coffee or tea                        Coffee or tea  _____________________________________________________________________    Take these medicines the morning of surgery with A SIP OF WATER: none                                You may not  have any metal on your body including hair pins and              piercings  Do not wear jewelry, make-up, lotions, powders or perfumes, deodorant             Do not wear nail polish.  Do not shave  48 hours prior to surgery.              Men may shave face and neck.   Do not bring valuables to the hospital. [place].  Contacts, dentures or bridgework may not be worn into surgery.  Leave suitcase in the car. After surgery it may be brought to your room.      Incentive Spirometer  An incentive spirometer is a tool that can help keep your lungs clear and active. This tool measures how well you are filling your lungs with each breath. Taking long deep breaths may help reverse or decrease the chance of developing breathing (pulmonary) problems (especially infection) following:  A long period of time when you are unable to move or be active. BEFORE THE PROCEDURE   If the spirometer includes an indicator to show your best effort, your nurse or respiratory therapist will set it to a desired goal.  If possible, sit up straight or lean slightly forward. Try not to slouch.  Hold the incentive spirometer in an upright position. INSTRUCTIONS FOR USE  1. Sit on the edge of your bed if possible, or sit up as far as you can in bed or on a chair. 2. Hold the incentive spirometer in an upright position. 3. Breathe out normally. 4. Place the mouthpiece in your mouth and seal your lips tightly around it. 5. Breathe in slowly and as deeply as possible, raising the piston or the ball toward the top of the column. 6. Hold your breath for 3-5 seconds or for as long as possible. Allow the piston or ball to fall to the bottom of the column. 7. Remove the mouthpiece from your mouth and breathe out normally. 8. Rest for a few  seconds and repeat Steps 1 through 7 at least 10 times every 1-2 hours when you are awake. Take your time and take a few normal breaths  between deep breaths. 9. The spirometer may include an indicator to show your best effort. Use the indicator as a goal to work toward during each repetition. 10. After each set of 10 deep breaths, practice coughing to be sure your lungs are clear. If you have an incision (the cut made at the time of surgery), support your incision when coughing by placing a pillow or rolled up towels firmly against it. Once you are able to get out of bed, walk around indoors and cough well. You may stop using the incentive spirometer when instructed by your caregiver.  RISKS AND COMPLICATIONS  Take your time so you do not get dizzy or light-headed.  If you are in pain, you may need to take or ask for pain medication before doing incentive spirometry. It is harder to take a deep breath if you are having pain. AFTER USE  Rest and breathe slowly and easily.  It can be helpful to keep track of a log of your progress. Your caregiver can provide you with a simple table to help with this. If you are using the spirometer at home, follow these instructions: [place] IF:   You are having difficultly using the spirometer.  You have trouble using the spirometer as often as instructed.  Your pain medication is not giving enough relief while using the spirometer.  You develop fever of 100.5 F (38.1 C) or higher. SEEK IMMEDIATE MEDICAL CARE IF:   You cough up bloody sputum that had not been present before.  You develop fever of 102 F (38.9 C) or greater.  You develop worsening pain at or near the incision site. MAKE SURE YOU:   Understand these instructions.  Will watch your condition.  Will get help right away if you are not doing well or get worse. Document Released: 05/20/2006 Document Revised: 04/01/2011 Document Reviewed: 07/21/2006 ExitCare Patient Information 2014 [place].   ________________________________________________________________________             Please read over  the following fact sheets you were given: _____________________________________________________________________             [facility] - Preparing for Surgery Before surgery, you can play an important role.  Because skin is not sterile, your skin needs to be as free of germs as possible.  You can reduce the number of germs on your skin by washing with CHG (chlorahexidine gluconate) soap before surgery.  CHG is an antiseptic cleaner which kills germs and bonds with the skin to continue killing germs even after washing. Please DO NOT use if you have an allergy to CHG or antibacterial soaps.  If your skin becomes reddened/irritated stop using the CHG and inform your nurse when you arrive at Short Stay. Do not shave (including legs and underarms) for at least 48 hours prior to the first CHG shower.  You may shave your face/neck. Please follow these instructions carefully:  1.  Shower with CHG Soap the night before surgery and the  morning of Surgery.  2.  If you choose to wash your hair, wash your hair first as usual with your  normal  shampoo.  3.  After you shampoo, rinse your hair and body thoroughly to remove the  shampoo.  4.  Use CHG as you would any other liquid soap.  You can apply chg directly  to the skin and wash                       Gently with a scrungie or clean washcloth.  5.  Apply the CHG Soap to your body ONLY FROM THE NECK DOWN.   Do not use on face/ open                           Wound or open sores. Avoid contact with eyes, ears mouth and genitals (private parts).                       Wash face,  Genitals (private parts) with your normal soap.             6.  Wash thoroughly, paying special attention to the area where your surgery  will be performed.  7.  Thoroughly rinse your body with warm water from the neck down.  8.  DO NOT shower/wash with your normal soap after using and rinsing off  the CHG Soap.                9.  Pat yourself dry with a  clean towel.            10.  Wear clean pajamas.            11.  Place clean sheets on your bed the night of your first shower and do not  sleep with pets. Day of Surgery : Do not apply any lotions/deodorants the morning of surgery.  Please wear clean clothes to the hospital/surgery center.  FAILURE TO FOLLOW THESE INSTRUCTIONS MAY RESULT IN THE CANCELLATION OF YOUR SURGERY PATIENT SIGNATURE_________________________________  NURSE SIGNATURE__________________________________  ________________________________________________________________________

## 2017-05-05 ENCOUNTER — Encounter (HOSPITAL_COMMUNITY): Payer: Self-pay

## 2017-05-05 ENCOUNTER — Other Ambulatory Visit: Payer: Self-pay

## 2017-05-05 ENCOUNTER — Encounter (HOSPITAL_COMMUNITY)
Admission: RE | Admit: 2017-05-05 | Discharge: 2017-05-05 | Disposition: A | Discharge status: Home / Self Care | Payer: Medicare Other | Source: Ambulatory Visit | Attending: Surgery | Admitting: Surgery

## 2017-05-05 DIAGNOSIS — Z8379 Family history of other diseases of the digestive system: Secondary | ICD-10-CM | POA: Diagnosis not present

## 2017-05-05 DIAGNOSIS — Z87440 Personal history of urinary (tract) infections: Secondary | ICD-10-CM | POA: Diagnosis not present

## 2017-05-05 DIAGNOSIS — R197 Diarrhea, unspecified: Secondary | ICD-10-CM | POA: Diagnosis not present

## 2017-05-05 DIAGNOSIS — K8010 Calculus of gallbladder with chronic cholecystitis without obstruction: Secondary | ICD-10-CM | POA: Diagnosis not present

## 2017-05-05 DIAGNOSIS — Z853 Personal history of malignant neoplasm of breast: Secondary | ICD-10-CM | POA: Diagnosis not present

## 2017-05-05 DIAGNOSIS — Z90710 Acquired absence of both cervix and uterus: Secondary | ICD-10-CM | POA: Diagnosis not present

## 2017-05-05 DIAGNOSIS — Z79899 Other long term (current) drug therapy: Secondary | ICD-10-CM | POA: Diagnosis not present

## 2017-05-05 DIAGNOSIS — Z9011 Acquired absence of right breast and nipple: Secondary | ICD-10-CM | POA: Diagnosis not present

## 2017-05-05 DIAGNOSIS — Z7982 Long term (current) use of aspirin: Secondary | ICD-10-CM | POA: Diagnosis not present

## 2017-05-05 DIAGNOSIS — I10 Essential (primary) hypertension: Secondary | ICD-10-CM | POA: Diagnosis not present

## 2017-05-05 DIAGNOSIS — R1011 Right upper quadrant pain: Secondary | ICD-10-CM | POA: Diagnosis present

## 2017-05-05 LAB — COMPREHENSIVE METABOLIC PANEL
ALT: 20 U/L (ref 14–54)
ANION GAP: 11 (ref 5–15)
AST: 24 U/L (ref 15–41)
Albumin: 4.6 g/dL (ref 3.5–5.0)
Alkaline Phosphatase: 40 U/L (ref 38–126)
BILIRUBIN TOTAL: 0.9 mg/dL (ref 0.3–1.2)
BUN: 19 mg/dL (ref 6–20)
CALCIUM: 9.6 mg/dL (ref 8.9–10.3)
CO2: 26 mmol/L (ref 22–32)
Chloride: 105 mmol/L (ref 101–111)
Creatinine, Ser: 0.8 mg/dL (ref 0.44–1.00)
GFR calc non Af Amer: >60 mL/min (ref >60)
Glucose, Bld: 102 mg/dL — ABNORMAL HIGH (ref 65–99)
Potassium: 4.4 mmol/L (ref 3.5–5.1)
SODIUM: 142 mmol/L (ref 135–145)
TOTAL PROTEIN: 8.2 g/dL — AB (ref 6.5–8.1)

## 2017-05-05 LAB — CBC WITH DIFFERENTIAL/PLATELET
Basophils Absolute: 0.1 10*3/uL (ref 0.0–0.1)
Basophils Relative: 1 %
EOS ABS: 0.2 10*3/uL (ref 0.0–0.7)
Eosinophils Relative: 2 %
HEMATOCRIT: 46.5 % — AB (ref 36.0–46.0)
HEMOGLOBIN: 15.2 g/dL — AB (ref 12.0–15.0)
LYMPHS ABS: 2.1 10*3/uL (ref 0.7–4.0)
Lymphocytes Relative: 34 %
MCH: 30.6 pg (ref 26.0–34.0)
MCHC: 32.7 g/dL (ref 30.0–36.0)
MCV: 93.6 fL (ref 78.0–100.0)
MONO ABS: 0.4 10*3/uL (ref 0.1–1.0)
MONOS PCT: 6 %
NEUTROS PCT: 57 %
Neutro Abs: 3.5 10*3/uL (ref 1.7–7.7)
Platelets: 271 10*3/uL (ref 150–400)
RBC: 4.97 MIL/uL (ref 3.87–5.11)
RDW: 14.6 % (ref 11.5–15.5)
WBC: 6.2 10*3/uL (ref 4.0–10.5)

## 2017-05-05 NOTE — Progress Notes (Signed)
11/18/2016- noted in [place].  10/23/2015- noted in Epic-CT Cardiac scoring

## 2017-05-06 NOTE — H&P (Signed)
[name]  Location: [facility] Patient #: [id number] DOB: 1935/09/22 Married / Language: English / Race: White Female  History of Present Illness   The patient is a 82 year old female who presents with a complaint of galll bladder.  The PCP is Dr. Audie Pinto  The patient was referred by Dr. Shelia Media  She comes by herself.  Her husband is doing well from his shoulder surgery. His knee was problems though, is being evaluated for diabetes. She continues to have symptoms of right upper quadrant discomfort. To her, it seems more common than several months ago. She is interested in proceeding with surgery. She has urgency to have a bowel movement and has been seen by Dr. Benson Norway for this. He has put her on Metamucil. We discussed postcholecystectomy symptoms and potential problems. She is interested in proceeding with surgery and we will schedule this.  History of gall bladder disease: For several months, she's had recurrent right upper quadrant pain. She had a real bad attack about 3 or 4 weeks ago. She can sometimes relate the pain to what she eats, but not always. She's had no fever, no jaundice, and has not gone to the emergency room for this pain. She has no known stomach or liver problems. Her last colonoscopy was 2 years ago by Dr. Carol Ada. She does have some loose stools/diarrhea which is controlled with Metamucil. She sees Dr. Geoffry Paradise for this. She had an ultrasound on 27 November 2016 which showed a small gallbladder polyp with a small mobile gallstone. Family history-her daughter had gallbladder disease. Her husband also had a cholecystectomy, but suffers from postcholecystectomy diarrhea, and is on cholestyramine.  I discussed with the patient the indications and risks of gall bladder surgery. The primary risks of gall bladder surgery include, but are not limited to, bleeding, infection, common bile duct  injury, and open surgery. There is also the risk that the patient may have continued symptoms after surgery. We discussed the typical post-operative recovery course. I tried to answer the patient's questions. I gave the patient literature about gall bladder surgery.  Plan: 1) schedule for gall bladder surgery  Past Medical History: 1. Left knee arthroscopy - 08/10/2014 - [facility] 2. Right breast cancer - 1997 Right mastectomy and reconstruction with Dr. Stephanie Coup She saw Dr. Ralene Ok for oncology  3. Hysterectomy/bladder tack in 2008 - [facility]. [facility]/M. Ottelin 4. HTN She sees Dr. Edmonia James, but her cardiac status is stable. 5. She has had some recurrent bladder infections. 6. Had colonoscopy by Dr. Benson Norway about 2 years ago She's had some bowel looseness - she takes Metamucil, which has helped.  Social History: Married. I did her husband's gall bladder. He has shoulder surgery this summer and is still getting over this. He had a tile business She had twins and a boy. Her twin daughter died of multiple myeloma and died about 96 (I did her gall bladder surgery). The twin son (80 yo) just got promoted to Chief of the SE for [employer]. Other son, [name], is 46, is running the family tile business.  Allergies (April Staton, CMA; 04/16/2017 8:52 AM) Sulfa Antibiotics  Swelling.  Medication History (April Staton, CMA; 04/16/2017 8:52 AM) AmLODIPine Besylate (5MG Tablet, Oral) Active. Losartan Potassium (100MG Tablet, Oral) Active. Rosuvastatin Calcium (20MG Tablet, Oral) Active. Aspirin (81MG Tablet, Oral) Active. Caltrate 600 + D (600-125MG-IU Tablet, Oral) Active. Temovate (0.05% Ointment, External) Active. Omega 3 (1000MG Capsule, Oral) Active. Prolia (60MG/ML Solution, Subcutaneous) Active. Hydrocortisone-Acetic Acid (1-2-3%  Solution, Otic) Active. Medications Reconciled  Vitals (April Staton CMA; 04/16/2017 8:52 AM) 04/16/2017 8:52  AM Weight: 175.13 lb Height: 65in Body Surface Area: 1.87 m Body Mass Index: 29.14 kg/m  Temp.: 98.57F(Oral)  Pulse: 82 (Regular)  BP: 126/74 (Sitting, Left Arm, Standard)   Physical Exam  General: Older WF who is alert and generally healthy appearing. HEENT: Normal. Pupils equal.  Neck: Supple. No mass. No thyroid mass.  Lymph Nodes: No supraclavicular or cervical nodes.  Lungs: Clear to auscultation and symmetric breath sounds. Heart: RRR. No murmur or rub.  Abdomen: Soft. No mass. No tenderness. No hernia. Normal bowel sounds.  Lower abdominal incision from TRAM reconstruction  Extremities: Good strength and ROM in upper and lower extremities.  Neurologic: Grossly intact to motor and sensory function. Psychiatric: Has normal mood and affect. Behavior is normal.   Assessment & Plan  1.  GALL BLADDER DISEASE (K82.9)  Plan:  1) Will plan lap chole  2.  HISTORY OF RIGHT BREAST CANCER (Z85.3) - 1997  Right mastectomy and reconstruction with Dr. Stephanie Coup  She saw Dr. Ralene Ok for oncology  3. Hysterectomy/bladder tack in 2008 - T. Henley/M. Ottelin 4. HTN  She sees Dr. Edmonia James, but her cardiac status is stable. 5. She has had some recurrent bladder infections.  Alphonsa Overall, MD, [facility] Surgery Pager: [phone] Office phone:  [phone]

## 2017-05-08 ENCOUNTER — Ambulatory Visit (HOSPITAL_COMMUNITY)
Admission: RE | Admit: 2017-05-08 | Discharge: 2017-05-08 | Disposition: A | Discharge status: Home / Self Care | Payer: Medicare Other | Source: Ambulatory Visit | Attending: Surgery | Admitting: Surgery

## 2017-05-08 ENCOUNTER — Encounter (HOSPITAL_COMMUNITY): Payer: Self-pay

## 2017-05-08 ENCOUNTER — Ambulatory Visit (HOSPITAL_COMMUNITY): Payer: Medicare Other

## 2017-05-08 ENCOUNTER — Encounter (HOSPITAL_COMMUNITY)
Admission: RE | Disposition: A | Discharge status: Home / Self Care | Payer: Self-pay | Source: Ambulatory Visit | Attending: Surgery

## 2017-05-08 ENCOUNTER — Ambulatory Visit (HOSPITAL_COMMUNITY): Payer: Medicare Other | Admitting: Anesthesiology

## 2017-05-08 DIAGNOSIS — Z79899 Other long term (current) drug therapy: Secondary | ICD-10-CM | POA: Insufficient documentation

## 2017-05-08 DIAGNOSIS — Z9011 Acquired absence of right breast and nipple: Secondary | ICD-10-CM | POA: Insufficient documentation

## 2017-05-08 DIAGNOSIS — I440 Atrioventricular block, first degree: Secondary | ICD-10-CM | POA: Diagnosis not present

## 2017-05-08 DIAGNOSIS — Z7982 Long term (current) use of aspirin: Secondary | ICD-10-CM | POA: Diagnosis not present

## 2017-05-08 DIAGNOSIS — K8020 Calculus of gallbladder without cholecystitis without obstruction: Secondary | ICD-10-CM | POA: Diagnosis not present

## 2017-05-08 DIAGNOSIS — Z87440 Personal history of urinary (tract) infections: Secondary | ICD-10-CM | POA: Diagnosis not present

## 2017-05-08 DIAGNOSIS — K8010 Calculus of gallbladder with chronic cholecystitis without obstruction: Secondary | ICD-10-CM | POA: Diagnosis not present

## 2017-05-08 DIAGNOSIS — R197 Diarrhea, unspecified: Secondary | ICD-10-CM | POA: Diagnosis not present

## 2017-05-08 DIAGNOSIS — I10 Essential (primary) hypertension: Secondary | ICD-10-CM | POA: Insufficient documentation

## 2017-05-08 DIAGNOSIS — Z853 Personal history of malignant neoplasm of breast: Secondary | ICD-10-CM | POA: Insufficient documentation

## 2017-05-08 DIAGNOSIS — Z8379 Family history of other diseases of the digestive system: Secondary | ICD-10-CM | POA: Diagnosis not present

## 2017-05-08 DIAGNOSIS — E785 Hyperlipidemia, unspecified: Secondary | ICD-10-CM | POA: Diagnosis not present

## 2017-05-08 DIAGNOSIS — Z90710 Acquired absence of both cervix and uterus: Secondary | ICD-10-CM | POA: Insufficient documentation

## 2017-05-08 HISTORY — PX: CHOLECYSTECTOMY: SHX55

## 2017-05-08 SURGERY — LAPAROSCOPIC CHOLECYSTECTOMY WITH INTRAOPERATIVE CHOLANGIOGRAM
Anesthesia: General | Site: Abdomen

## 2017-05-08 MED ORDER — EPHEDRINE 5 MG/ML INJ
INTRAVENOUS | Status: AC
  Filled 2017-05-08: qty 10

## 2017-05-08 MED ORDER — BUPIVACAINE-EPINEPHRINE 0.5% -1:200000 IJ SOLN
INTRAMUSCULAR | Status: AC
  Filled 2017-05-08: qty 1

## 2017-05-08 MED ORDER — ONDANSETRON HCL 4 MG/2ML IJ SOLN
INTRAMUSCULAR | Status: DC | PRN
  Administered 2017-05-08: 4 mg via INTRAVENOUS

## 2017-05-08 MED ORDER — PROPOFOL 10 MG/ML IV BOLUS
INTRAVENOUS | Status: DC | PRN
  Administered 2017-05-08: 150 mg via INTRAVENOUS

## 2017-05-08 MED ORDER — IOPAMIDOL (ISOVUE-300) INJECTION 61%
INTRAVENOUS | Status: AC
  Filled 2017-05-08: qty 50

## 2017-05-08 MED ORDER — LIDOCAINE 2% (20 MG/ML) 5 ML SYRINGE
INTRAMUSCULAR | Status: AC
  Filled 2017-05-08: qty 5

## 2017-05-08 MED ORDER — CHLORHEXIDINE GLUCONATE CLOTH 2 % EX PADS: 6.0000 | MEDICATED_PAD | Freq: Once | CUTANEOUS | Status: DC

## 2017-05-08 MED ORDER — FENTANYL CITRATE (PF) 100 MCG/2ML IJ SOLN
INTRAMUSCULAR | Status: DC | PRN
  Administered 2017-05-08 (×4): 25 ug via INTRAVENOUS

## 2017-05-08 MED ORDER — HYDROCODONE-ACETAMINOPHEN 5-325 MG PO TABS: 1.0000 | ORAL_TABLET | Freq: Four times a day (QID) | ORAL | 0 refills | Status: DC | PRN

## 2017-05-08 MED ORDER — FENTANYL CITRATE (PF) 100 MCG/2ML IJ SOLN: 25.0000 ug | INTRAMUSCULAR | Status: DC | PRN

## 2017-05-08 MED ORDER — ACETAMINOPHEN 500 MG PO TABS
1000.0000 mg | ORAL_TABLET | ORAL | Status: AC
  Administered 2017-05-08: 1000 mg via ORAL
  Filled 2017-05-08: qty 2

## 2017-05-08 MED ORDER — SUGAMMADEX SODIUM 200 MG/2ML IV SOLN
INTRAVENOUS | Status: DC | PRN
  Administered 2017-05-08: 200 mg via INTRAVENOUS

## 2017-05-08 MED ORDER — SUCCINYLCHOLINE CHLORIDE 200 MG/10ML IV SOSY
PREFILLED_SYRINGE | INTRAVENOUS | Status: AC
  Filled 2017-05-08: qty 10

## 2017-05-08 MED ORDER — SUGAMMADEX SODIUM 200 MG/2ML IV SOLN
INTRAVENOUS | Status: AC
  Filled 2017-05-08: qty 2

## 2017-05-08 MED ORDER — GABAPENTIN 300 MG PO CAPS
300.0000 mg | ORAL_CAPSULE | ORAL | Status: AC
  Administered 2017-05-08: 300 mg via ORAL
  Filled 2017-05-08: qty 1

## 2017-05-08 MED ORDER — ONDANSETRON HCL 4 MG/2ML IJ SOLN
INTRAMUSCULAR | Status: AC
  Filled 2017-05-08: qty 2

## 2017-05-08 MED ORDER — LACTATED RINGERS IR SOLN
Status: DC | PRN
  Administered 2017-05-08: 1000 mL

## 2017-05-08 MED ORDER — ROCURONIUM BROMIDE 10 MG/ML (PF) SYRINGE
PREFILLED_SYRINGE | INTRAVENOUS | Status: DC | PRN
  Administered 2017-05-08: 40 mg via INTRAVENOUS

## 2017-05-08 MED ORDER — SUCCINYLCHOLINE CHLORIDE 200 MG/10ML IV SOSY
PREFILLED_SYRINGE | INTRAVENOUS | Status: DC | PRN
  Administered 2017-05-08: 100 mg via INTRAVENOUS

## 2017-05-08 MED ORDER — BUPIVACAINE-EPINEPHRINE 0.5% -1:200000 IJ SOLN
INTRAMUSCULAR | Status: DC | PRN
  Administered 2017-05-08: 30 mL

## 2017-05-08 MED ORDER — IOPAMIDOL (ISOVUE-300) INJECTION 61%
INTRAVENOUS | Status: DC | PRN
  Administered 2017-05-08: 4.5 mL

## 2017-05-08 MED ORDER — LACTATED RINGERS IV SOLN
INTRAVENOUS | Status: DC
  Administered 2017-05-08: 08:00:00 via INTRAVENOUS

## 2017-05-08 MED ORDER — LIDOCAINE 2% (20 MG/ML) 5 ML SYRINGE
INTRAMUSCULAR | Status: DC | PRN
  Administered 2017-05-08: 10 mg via INTRAVENOUS

## 2017-05-08 MED ORDER — 0.9 % SODIUM CHLORIDE (POUR BTL) OPTIME
TOPICAL | Status: DC | PRN
  Administered 2017-05-08: 1000 mL

## 2017-05-08 MED ORDER — BUPIVACAINE HCL (PF) 0.25 % IJ SOLN
INTRAMUSCULAR | Status: AC
  Filled 2017-05-08: qty 30

## 2017-05-08 MED ORDER — ROCURONIUM BROMIDE 10 MG/ML (PF) SYRINGE
PREFILLED_SYRINGE | INTRAVENOUS | Status: AC
  Filled 2017-05-08: qty 5

## 2017-05-08 MED ORDER — DEXAMETHASONE SODIUM PHOSPHATE 10 MG/ML IJ SOLN
INTRAMUSCULAR | Status: DC | PRN
  Administered 2017-05-08: 10 mg via INTRAVENOUS

## 2017-05-08 MED ORDER — CEFAZOLIN SODIUM-DEXTROSE 2-4 GM/100ML-% IV SOLN
2.0000 g | INTRAVENOUS | Status: AC
  Administered 2017-05-08: 2 g via INTRAVENOUS
  Filled 2017-05-08: qty 100

## 2017-05-08 MED ORDER — EPHEDRINE SULFATE-NACL 50-0.9 MG/10ML-% IV SOSY
PREFILLED_SYRINGE | INTRAVENOUS | Status: DC | PRN
  Administered 2017-05-08: 10 mg via INTRAVENOUS

## 2017-05-08 MED ORDER — DEXAMETHASONE SODIUM PHOSPHATE 10 MG/ML IJ SOLN
INTRAMUSCULAR | Status: AC
  Filled 2017-05-08: qty 1

## 2017-05-08 MED ORDER — FENTANYL CITRATE (PF) 100 MCG/2ML IJ SOLN
INTRAMUSCULAR | Status: AC
  Filled 2017-05-08: qty 2

## 2017-05-08 SURGICAL SUPPLY — 38 items
APPLIER CLIP 5 13 M/L LIGAMAX5 (MISCELLANEOUS) ×3
APPLIER CLIP ROT 10 11.4 M/L (STAPLE)
BENZOIN TINCTURE PRP APPL 2/3 (GAUZE/BANDAGES/DRESSINGS) IMPLANT
CABLE HIGH FREQUENCY MONO STRZ (ELECTRODE) ×3 IMPLANT
CHLORAPREP W/TINT 26ML (MISCELLANEOUS) ×3 IMPLANT
CHOLANGIOGRAM CATH TAUT (CATHETERS) ×3 IMPLANT
CLIP APPLIE 5 13 M/L LIGAMAX5 (MISCELLANEOUS) ×1 IMPLANT
CLIP APPLIE ROT 10 11.4 M/L (STAPLE) IMPLANT
CLOSURE WOUND 1/4X4 (GAUZE/BANDAGES/DRESSINGS)
COVER MAYO STAND STRL (DRAPES) ×3 IMPLANT
COVER SURGICAL LIGHT HANDLE (MISCELLANEOUS) ×3 IMPLANT
DECANTER SPIKE VIAL GLASS SM (MISCELLANEOUS) ×3 IMPLANT
DERMABOND ADVANCED (GAUZE/BANDAGES/DRESSINGS) ×2
DERMABOND ADVANCED .7 DNX12 (GAUZE/BANDAGES/DRESSINGS) ×1 IMPLANT
DRAPE C-ARM 42X120 X-RAY (DRAPES) ×3 IMPLANT
ELECT REM PT RETURN 15FT ADLT (MISCELLANEOUS) ×3 IMPLANT
GLOVE SURG SIGNA 7.5 PF LTX (GLOVE) ×3 IMPLANT
GOWN STRL REUS W/TWL XL LVL3 (GOWN DISPOSABLE) ×9 IMPLANT
HEMOSTAT SURGICEL 4X8 (HEMOSTASIS) IMPLANT
IV CATH 14GX2 1/4 (CATHETERS) ×3 IMPLANT
IV SET EXTENSION CATH 6 NF (IV SETS) ×3 IMPLANT
KIT BASIN OR (CUSTOM PROCEDURE TRAY) ×3 IMPLANT
POUCH RETRIEVAL ECOSAC 10 (ENDOMECHANICALS) ×1 IMPLANT
POUCH RETRIEVAL ECOSAC 10MM (ENDOMECHANICALS) ×2
SCISSORS LAP 5X35 DISP (ENDOMECHANICALS) ×3 IMPLANT
SET IRRIG TUBING LAPAROSCOPIC (IRRIGATION / IRRIGATOR) ×3 IMPLANT
SLEEVE ADV FIXATION 5X100MM (TROCAR) ×3 IMPLANT
STOPCOCK 4 WAY LG BORE MALE ST (IV SETS) ×3 IMPLANT
STRIP CLOSURE SKIN 1/4X4 (GAUZE/BANDAGES/DRESSINGS) IMPLANT
SUT MNCRL AB 4-0 PS2 18 (SUTURE) ×3 IMPLANT
SYR 10ML ECCENTRIC (SYRINGE) ×3 IMPLANT
TOWEL OR 17X26 10 PK STRL BLUE (TOWEL DISPOSABLE) ×3 IMPLANT
TOWEL OR NON WOVEN STRL DISP B (DISPOSABLE) ×3 IMPLANT
TRAY LAPAROSCOPIC (CUSTOM PROCEDURE TRAY) ×3 IMPLANT
TROCAR ADV FIXATION 11X100MM (TROCAR) ×3 IMPLANT
TROCAR ADV FIXATION 5X100MM (TROCAR) ×3 IMPLANT
TROCAR XCEL BLUNT TIP 100MML (ENDOMECHANICALS) ×3 IMPLANT
TUBING INSUF HEATED (TUBING) ×3 IMPLANT

## 2017-05-08 NOTE — Anesthesia Preprocedure Evaluation (Addendum)
Anesthesia Evaluation  Patient identified by MRN, date of birth, ID band Patient awake    Reviewed: Allergy & Precautions, NPO status , Patient's Chart, lab work & pertinent test results  Airway Mallampati: II  TM Distance: >3 FB     Dental   Pulmonary pneumonia,    breath sounds clear to auscultation       Cardiovascular hypertension, + dysrhythmias  Rhythm:Regular Rate:Normal     Neuro/Psych    GI/Hepatic negative GI ROS, Neg liver ROS,   Endo/Other  negative endocrine ROS  Renal/GU negative Renal ROS     Musculoskeletal  (+) Arthritis ,   Abdominal   Peds  Hematology   Anesthesia Other Findings   Reproductive/Obstetrics                             Anesthesia Physical Anesthesia Plan  ASA: III  Anesthesia Plan: General   Post-op Pain Management:    Induction: Intravenous  PONV Risk Score and Plan: 3 and Treatment may vary due to age or medical condition  Airway Management Planned: Oral ETT  Additional Equipment:   Intra-op Plan:   Post-operative Plan: Extubation in OR  Informed Consent: I have reviewed the patients History and Physical, chart, labs and discussed the procedure including the risks, benefits and alternatives for the proposed anesthesia with the patient or authorized representative who has indicated his/her understanding and acceptance.   Dental advisory given  Plan Discussed with: CRNA and Anesthesiologist  Anesthesia Plan Comments:        Anesthesia Quick Evaluation

## 2017-05-08 NOTE — Anesthesia Postprocedure Evaluation (Signed)
Anesthesia Post Note  Patient: [name]  Procedure(s) Performed: LAPAROSCOPIC CHOLECYSTECTOMY WITH INTRAOPERATIVE CHOLANGIOGRAM (N/A Abdomen)     Patient location during evaluation: PACU Anesthesia Type: General Pain management: pain level controlled Vital Signs Assessment: post-procedure vital signs reviewed and stable Respiratory status: spontaneous breathing Cardiovascular status: stable Anesthetic complications: no    Last Vitals:  Vitals:   05/08/17 1118 05/08/17 1152  BP: (!) 153/95 139/71  Pulse: 75 72  Resp: 16 18  Temp: 36.6 C 36.5 C  SpO2: 97%     Last Pain:  Vitals:   05/08/17 1100  TempSrc:   PainSc: 0-No pain                 Demara Lover

## 2017-05-08 NOTE — Op Note (Signed)
05/08/2017  10:35 AM  PATIENT:  [name], 82 y.o., female, MRN: [id number]  PREOP DIAGNOSIS:  symptomatic gallstones  POSTOP DIAGNOSIS:   Chronic cholecystitis, cholelithiasis  PROCEDURE:   Procedure(s):  LAPAROSCOPIC CHOLECYSTECTOMY WITH INTRAOPERATIVE CHOLANGIOGRAM (5 port)  SURGEON:   Alphonsa Overall, M.D.  Terrence DupontOk Anis, M.D.  ANESTHESIA:   general  Anesthesiologist: Belinda Block, MD CRNA: Lind Covert, CRNA  General  ASA: 3  EBL:  minimal  ml  BLOOD ADMINISTERED: none  DRAINS: none   LOCAL MEDICATIONS USED:   30 cc 1/2% marcaine  SPECIMEN:   Gall bladder  COUNTS CORRECT:  YES  INDICATIONS FOR PROCEDURE:  [name] is a 82 y.o. (DOB: July 25, 1935) white female whose primary care physician is Cynthia Pretty, MD and comes for cholecystectomy.   The indications and risks of the gall bladder surgery were explained to the patient.  The risks include, but are not limited to, infection, bleeding, common bile duct injury and open surgery.  SURGERY:  The patient was taken to OR room #[facility] at [facility].  The abdomen was prepped with chloroprep.  The patient was given 2 gm Ancef at the beginning of the operation.   A time out was held and the surgical checklist run.   Because of her prior right breast TRAM and abdominoplasty, I accessed her abdominal cavity with a 5 mm Ethicon Optiview in the left upper quadrant of the abdomen.  Four additional trocars were inserted: a 10 mm trocar in the sub-xiphoid location, a 5 mm trocar in the right mid subcostal area, a 5 mm trocar in the right lateral subcostal area, and a 5 mm trocar to the right of the umbilicus for the camera.   The abdomen was explored and the liver, stomach, and bowel that could be seen were unremarkable.   The gall bladder was whitish, consistent with mild chronic cholecystitis.   I grasped the gall bladder and rotated it cephalad.  Disssection was carried down to the gall  bladder/cystic duct junction and the cystic duct isolated.  A clip was placed on the gall bladder side of the cystic duct.   An intra-operative cholangiogram was shot.   The intra-operative cholangiogram was shot using a cut off Taut catheter placed through a 14 gauge angiocath in the RUQ.  The Taut catheter was inserted in the cut cystic duct and secured with an endoclip.  A cholangiogram was shot with 8 cc of 1/2 strength Isoview.  Using fluoroscopy, the cholangiogram showed the flow of contrast into the common bile duct, up the hepatic radicals, and into the duodenum.  There was no mass or obstruction.  This was a normal intra-operative cholangiogram.   The Taut catheter was removed.  The cystic duct was tripley endoclipped and the cystic artery was identified and clipped.  The gall bladder was bluntly and sharpley dissected from the gall bladder bed.   After the gall bladder was removed from the liver, the gall bladder bed and Tri[place] of [place] were inspected.  There was no bleeding or bile leak.  The gall bladder was placed in a Eco sac bag and delivered through the umbilicus.  The abdomen was irrigated with 500 cc saline.   The trocars were then removed.  I infiltrated 30 cc of 1/2 % Marcaine into the incisions.  A 0 vicryl suture was placed in the subxiphoid port. The skin closed with 4-0 Monocryl.  The skin was painted with DermaBond.  The  patient's sponge and needle count were correct.  The patient was transported to the RR in good condition.   I have a surgeon as a first assist to retract, expose, and assist on this difficult operation.   Alphonsa Overall, MD, [facility] Surgery Pager: [phone] Office phone:  [phone]

## 2017-05-08 NOTE — Anesthesia Procedure Notes (Signed)
Procedure Name: Intubation Date/Time: 05/08/2017 9:37 AM Performed by: Lind Covert, CRNA Pre-anesthesia Checklist: Patient identified, Emergency Drugs available, Suction available, Patient being monitored and Timeout performed Patient Re-evaluated:Patient Re-evaluated prior to induction Oxygen Delivery Method: Circle system utilized Preoxygenation: Pre-oxygenation with 100% oxygen Induction Type: IV induction Laryngoscope Size: Mac and 4 Grade View: Grade I Tube type: Oral Tube size: 7.0 mm Number of attempts: 1 Airway Equipment and Method: Stylet Placement Confirmation: ETT inserted through vocal cords under direct vision,  breath sounds checked- equal and bilateral and positive ETCO2 Secured at: 21 cm Tube secured with: Tape Dental Injury: Teeth and Oropharynx as per pre-operative assessment

## 2017-05-08 NOTE — Discharge Instructions (Addendum)
[facility] [place] [facility] - DISCHARGE INSTRUCTIONS TO PATIENT  Activity:  Driving - May drive in 3 or 4 days, if doing well.   Lifting - No lifting more than 15 pounds for 2 weeks.  Wound Care:   Leave incisions dry until Sunday, 4/20, then you may shower.  Diet:  As tolerated.  Follow up appointment:  Call Dr. Pollie Friar office [facility]) at [phone] for an appointment in 2 to 3 weeks  Medications and dosages:  Resume your home medications.  You have a prescription for:  Vicodin  Call Dr. Lucia Gaskins or his office  [phone]) if you have:  Temperature greater than 100.4,  Persistent nausea and vomiting,  Severe uncontrolled pain,  Redness, tenderness, or signs of infection (pain, swelling, redness, odor or green/yellow discharge around the site),  Difficulty breathing, headache or visual disturbances,  Any other questions or concerns you may have after discharge.  In an emergency, call 911 or go to an Emergency Department at a nearby hospital.

## 2017-05-08 NOTE — Transfer of Care (Signed)
Immediate Anesthesia Transfer of Care Note  Patient: [name]  Procedure(s) Performed: LAPAROSCOPIC CHOLECYSTECTOMY WITH INTRAOPERATIVE CHOLANGIOGRAM (N/A Abdomen)  Patient Location: PACU  Anesthesia Type:General  Level of Consciousness: sedated  Airway & Oxygen Therapy: Patient Spontanous Breathing and Patient connected to face mask oxygen  Post-op Assessment: Report given to RN and Post -op Vital signs reviewed and stable  Post vital signs: Reviewed and stable  Last Vitals:  Vitals Value Taken Time  BP 125/62 05/08/2017 10:33 AM  Temp    Pulse 78 05/08/2017 10:34 AM  Resp 15 05/08/2017 10:34 AM  SpO2 97 % 05/08/2017 10:34 AM  Vitals shown include unvalidated device data.  Last Pain:  Vitals:   05/08/17 0747  TempSrc:   PainSc: 0-No pain         Complications: No apparent anesthesia complications

## 2017-05-08 NOTE — Interval H&P Note (Signed)
History and Physical Interval Note:  05/08/2017 9:09 AM  [name]  has presented today for surgery, with the diagnosis of symptomatic gallstones  The various methods of treatment have been discussed with the patient and family.  Husband at the bedside.  After consideration of risks, benefits and other options for treatment, the patient has consented to  Procedure(s): LAPAROSCOPIC CHOLECYSTECTOMY WITH INTRAOPERATIVE CHOLANGIOGRAM ERAS PATHWAY (N/A) as a surgical intervention .  The patient's history has been reviewed, patient examined, no change in status, stable for surgery.  I have reviewed the patient's chart and labs.  Questions were answered to the patient's satisfaction.     Shann Medal

## 2017-05-09 ENCOUNTER — Encounter (HOSPITAL_COMMUNITY): Payer: Self-pay | Admitting: Surgery

## 2017-05-23 DIAGNOSIS — R358 Other polyuria: Secondary | ICD-10-CM | POA: Diagnosis not present

## 2017-05-23 DIAGNOSIS — N390 Urinary tract infection, site not specified: Secondary | ICD-10-CM | POA: Diagnosis not present

## 2017-05-26 DIAGNOSIS — M810 Age-related osteoporosis without current pathological fracture: Secondary | ICD-10-CM | POA: Diagnosis not present

## 2017-06-25 DIAGNOSIS — R358 Other polyuria: Secondary | ICD-10-CM | POA: Diagnosis not present

## 2017-06-25 DIAGNOSIS — N390 Urinary tract infection, site not specified: Secondary | ICD-10-CM | POA: Diagnosis not present

## 2017-07-09 DIAGNOSIS — R8271 Bacteriuria: Secondary | ICD-10-CM | POA: Diagnosis not present

## 2017-07-09 DIAGNOSIS — N390 Urinary tract infection, site not specified: Secondary | ICD-10-CM | POA: Diagnosis not present

## 2017-07-09 DIAGNOSIS — R351 Nocturia: Secondary | ICD-10-CM | POA: Diagnosis not present

## 2017-07-09 DIAGNOSIS — R350 Frequency of micturition: Secondary | ICD-10-CM | POA: Diagnosis not present

## 2017-07-21 DIAGNOSIS — R351 Nocturia: Secondary | ICD-10-CM | POA: Diagnosis not present

## 2017-07-21 DIAGNOSIS — R350 Frequency of micturition: Secondary | ICD-10-CM | POA: Diagnosis not present

## 2017-07-21 DIAGNOSIS — N1339 Other hydronephrosis: Secondary | ICD-10-CM | POA: Diagnosis not present

## 2017-07-28 DIAGNOSIS — N1330 Unspecified hydronephrosis: Secondary | ICD-10-CM | POA: Diagnosis not present

## 2017-07-28 DIAGNOSIS — B9620 Unspecified Escherichia coli [E. coli] as the cause of diseases classified elsewhere: Secondary | ICD-10-CM | POA: Diagnosis not present

## 2017-07-28 DIAGNOSIS — N390 Urinary tract infection, site not specified: Secondary | ICD-10-CM | POA: Diagnosis not present

## 2017-07-28 DIAGNOSIS — K5730 Diverticulosis of large intestine without perforation or abscess without bleeding: Secondary | ICD-10-CM | POA: Diagnosis not present

## 2017-08-29 DIAGNOSIS — N3946 Mixed incontinence: Secondary | ICD-10-CM | POA: Diagnosis not present

## 2017-08-29 DIAGNOSIS — B9620 Unspecified Escherichia coli [E. coli] as the cause of diseases classified elsewhere: Secondary | ICD-10-CM | POA: Diagnosis not present

## 2017-08-29 DIAGNOSIS — N390 Urinary tract infection, site not specified: Secondary | ICD-10-CM | POA: Diagnosis not present

## 2017-09-24 DIAGNOSIS — M79671 Pain in right foot: Secondary | ICD-10-CM | POA: Diagnosis not present

## 2017-10-06 DIAGNOSIS — N762 Acute vulvitis: Secondary | ICD-10-CM | POA: Diagnosis not present

## 2017-10-28 DIAGNOSIS — H35033 Hypertensive retinopathy, bilateral: Secondary | ICD-10-CM | POA: Diagnosis not present

## 2017-10-28 DIAGNOSIS — H04123 Dry eye syndrome of bilateral lacrimal glands: Secondary | ICD-10-CM | POA: Diagnosis not present

## 2017-10-31 DIAGNOSIS — Z23 Encounter for immunization: Secondary | ICD-10-CM | POA: Diagnosis not present

## 2017-11-13 NOTE — Progress Notes (Signed)
Cardiology Office Note   Date:  11/19/2017   ID:  [name], DOB 07/24/1935, MRN [id number]  PCP:  Deland Pretty, MD  Cardiologist:  Dr. Johnsie Cancel    No chief complaint on file.     History of Present Illness:  82 y.o. f/u atypical chest pain. First seen 2011 pain in setting of pneumonia. TTE with LVH EF 60% Myovue normal no ischemia.  Calcium score 10/23/15  Was 69 50th percentile for age and sex. Benign RM/lingular nodules thought benign stable since 2014 On statin Labs followed by Dr Shelia Media. Two drug Rx for HTN  No cardiac complaints  .     Past Medical History:  Diagnosis Date  . Acne rosacea   . Arthritis   . Breast cancer [facility])    Has had a prior right right mastectomy and chemotherapy  . Cardiomegaly    Normal echocardiogram and cardiac workup by Dr. Johnsie Cancel  . Chest pain, unspecified   . Hyperlipidemia   . Hypertension   . Irritable bladder   . Lymphedema of arm    Right, post lumph node infection  . Osteopenia   . Pneumonia    5 years ago  . Polyp of gallbladder   . Thyroid nodule     Past Surgical History:  Procedure Laterality Date  . ABDOMINAL HYSTERECTOMY    . CHOLECYSTECTOMY N/A 05/08/2017   Procedure: LAPAROSCOPIC CHOLECYSTECTOMY WITH INTRAOPERATIVE CHOLANGIOGRAM;  Surgeon: Alphonsa Overall, MD;  Location: [facility];  Service: General;  Laterality: N/A;  ERAS PATHWAY  . EYE SURGERY     cataract surgery bilateral  . KNEE ARTHROSCOPY Left 08/10/2014   Procedure: LEFT KNEE ARTHROSCOPY WITH MENSCIAL DEBRIDEMENT CONDROPLASTY;  Surgeon: Gaynelle Arabian, MD;  Location: [facility];  Service: Orthopedics;  Laterality: Left;  Marland Kitchen MASTECTOMY     right breast  . RECONSTRUCTION BREAST W/ TRAM FLAP  1997   after right mastectomy  . TONSILLECTOMY    . VESICOVAGINAL FISTULA CLOSURE W/ TAH       Current Outpatient Medications  Medication Sig Dispense Refill  . amLODipine (NORVASC) 5 MG tablet Take 5 mg by mouth at bedtime.    Marland Kitchen aspirin EC 81 MG tablet Take 81 mg by  mouth at bedtime.    . Calcium-Vitamin D (CALTRATE 600 PLUS-VIT D PO) Take 2 tablets by mouth at bedtime.     . cephALEXin (KEFLEX) 250 MG capsule Take 1 capsule by mouth as directed.    . clobetasol ointment (TEMOVATE) 3.41 % Apply 1 application topically 2 (two) times daily as needed (for lichen sclerosus).     Marland Kitchen denosumab (PROLIA) 60 MG/ML SOLN injection Inject 60 mg into the skin every 6 (six) months.     . DHA-EPA-Eve Prim Oil-Vit E (EPA/GLA) CAPS Take 2 capsules by mouth at bedtime. HydroEye Softgels Dry Eye Relief    . losartan (COZAAR) 100 MG tablet Take 100 mg by mouth at bedtime.     . Omega-3 Fatty Acids (FISH OIL) 1000 MG CAPS Take 1,000 mg by mouth at bedtime.     Vladimir Faster Glycol-Propyl Glycol (SYSTANE) 0.4-0.3 % SOLN Place 1 drop into both eyes 3 (three) times daily as needed (for dry eyes.).    Marland Kitchen rosuvastatin (CRESTOR) 20 MG tablet Take 10 mg by mouth at bedtime.     No current facility-administered medications for this visit.     Allergies:   Sulfonamide derivatives    Social History:  The patient  reports that she has never smoked.  She has never used smokeless tobacco. She reports that she does not drink alcohol or use drugs.   Family History:  The patient's family history includes Congestive Heart Failure in her mother; Coronary artery disease in her unknown relative; Heart attack (age of onset: 63) in her father; Rheum arthritis in her mother.    ROS:  General:no colds or fevers, no weight changes Skin:no rashes or ulcers HEENT:no blurred vision, no congestion CV:see HPI PUL:see HPI GI:no diarrhea constipation or melena, no indigestion GU:no hematuria, no dysuria MS:no joint pain, no claudication Neuro:no syncope, no lightheadedness Endo:no diabetes, no thyroid disease She has had her flu vaccine  Wt Readings from Last 3 Encounters:  11/19/17 168 lb 8 oz (76.4 kg)  05/08/17 173 lb 9.6 oz (78.7 kg)  05/05/17 173 lb 9.6 oz (78.7 kg)     PHYSICAL EXAM: VS:   Pulse 83   Ht 5' 5.5" (1.664 m)   Wt 168 lb 8 oz (76.4 kg)   SpO2 95%   BMI 27.61 kg/m  , BMI Body mass index is 27.61 kg/m. Affect appropriate Healthy:  appears stated age 9: normal Neck supple with no adenopathy JVP normal no bruits no thyromegaly Lungs clear with no wheezing and good diaphragmatic motion Heart:  S1/S2 no murmur, no rub, gallop or click PMI normal Abdomen: benighn, BS positve, no tenderness, no AAA no bruit.  No HSM or HJR Distal pulses intact with no bruits No edema Neuro non-focal Skin warm and dry No muscular weakness     EKG:   SR rate 73 PR 226 nonspecific ST changes 11/18/16    Recent Labs: 05/05/2017: ALT 20; BUN 19; Creatinine, Ser 0.80; Hemoglobin 15.2; Platelets 271; Potassium 4.4; Sodium 142    Lipid Panel    Component Value Date/Time   CHOL  07/10/2009 0340    130        ATP III CLASSIFICATION:  <200     mg/dL   Desirable  200-239  mg/dL   Borderline High  >=240    mg/dL   High          TRIG 161 (H) 07/10/2009 0340   HDL 43 07/10/2009 0340   CHOLHDL 3.0 07/10/2009 0340   VLDL 32 07/10/2009 0340   LDLCALC  07/10/2009 0340    55        Total Cholesterol/HDL:CHD Risk Coronary Heart Disease Risk Table                     Men   Women  1/2 Average Risk   3.4   3.3  Average Risk       5.0   4.4  2 X Average Risk   9.6   7.1  3 X Average Risk  23.4   11.0        Use the calculated Patient Ratio above and the CHD Risk Table to determine the patient's CHD Risk.        ATP III CLASSIFICATION (LDL):  <100     mg/dL   Optimal  100-129  mg/dL   Near or Above                    Optimal  130-159  mg/dL   Borderline  160-189  mg/dL   High  >190     mg/dL   Very High       Other studies Reviewed: Additional studies/ records that were reviewed today include: see above.   ASSESSMENT  AND PLAN:  1.  CAD  Relatively low calcium score 4 years ago without angina and on statin.  PCP to check labs.  Observe   2.  HTN controlled  continue current meds  3.  HLD controlled and to have labs Wed with PCP.   4. Lung nodules RM/lingular stable over years presumed benign    Jenkins Rouge

## 2017-11-19 ENCOUNTER — Ambulatory Visit (INDEPENDENT_AMBULATORY_CARE_PROVIDER_SITE_OTHER): Payer: Medicare Other | Admitting: Cardiovascular Disease

## 2017-11-19 ENCOUNTER — Encounter: Payer: Self-pay | Admitting: Cardiovascular Disease

## 2017-11-19 VITALS — BP 130/80 | HR 83 | Ht 65.5 in | Wt 168.5 lb

## 2017-11-19 DIAGNOSIS — E782 Mixed hyperlipidemia: Secondary | ICD-10-CM

## 2017-11-19 DIAGNOSIS — I10 Essential (primary) hypertension: Secondary | ICD-10-CM | POA: Diagnosis not present

## 2017-11-19 DIAGNOSIS — I2510 Atherosclerotic heart disease of native coronary artery without angina pectoris: Secondary | ICD-10-CM

## 2017-11-19 NOTE — Patient Instructions (Signed)

## 2017-11-25 DIAGNOSIS — I10 Essential (primary) hypertension: Secondary | ICD-10-CM | POA: Diagnosis not present

## 2017-11-25 DIAGNOSIS — M810 Age-related osteoporosis without current pathological fracture: Secondary | ICD-10-CM | POA: Diagnosis not present

## 2017-11-25 DIAGNOSIS — E7800 Pure hypercholesterolemia, unspecified: Secondary | ICD-10-CM | POA: Diagnosis not present

## 2017-11-27 DIAGNOSIS — I10 Essential (primary) hypertension: Secondary | ICD-10-CM | POA: Diagnosis not present

## 2017-11-27 DIAGNOSIS — E7800 Pure hypercholesterolemia, unspecified: Secondary | ICD-10-CM | POA: Diagnosis not present

## 2017-11-27 DIAGNOSIS — M810 Age-related osteoporosis without current pathological fracture: Secondary | ICD-10-CM | POA: Diagnosis not present

## 2017-11-27 DIAGNOSIS — Z0000 Encounter for general adult medical examination without abnormal findings: Secondary | ICD-10-CM | POA: Diagnosis not present

## 2017-12-01 DIAGNOSIS — M810 Age-related osteoporosis without current pathological fracture: Secondary | ICD-10-CM | POA: Diagnosis not present

## 2017-12-02 DIAGNOSIS — L821 Other seborrheic keratosis: Secondary | ICD-10-CM | POA: Diagnosis not present

## 2017-12-02 DIAGNOSIS — I788 Other diseases of capillaries: Secondary | ICD-10-CM | POA: Diagnosis not present

## 2017-12-02 DIAGNOSIS — D2261 Melanocytic nevi of right upper limb, including shoulder: Secondary | ICD-10-CM | POA: Diagnosis not present

## 2017-12-02 DIAGNOSIS — L812 Freckles: Secondary | ICD-10-CM | POA: Diagnosis not present

## 2017-12-02 DIAGNOSIS — L570 Actinic keratosis: Secondary | ICD-10-CM | POA: Diagnosis not present

## 2017-12-02 DIAGNOSIS — D1801 Hemangioma of skin and subcutaneous tissue: Secondary | ICD-10-CM | POA: Diagnosis not present

## 2017-12-26 DIAGNOSIS — M722 Plantar fascial fibromatosis: Secondary | ICD-10-CM | POA: Diagnosis not present

## 2018-01-07 DIAGNOSIS — Z1231 Encounter for screening mammogram for malignant neoplasm of breast: Secondary | ICD-10-CM | POA: Diagnosis not present

## 2018-01-07 DIAGNOSIS — Z01419 Encounter for gynecological examination (general) (routine) without abnormal findings: Secondary | ICD-10-CM | POA: Diagnosis not present

## 2018-01-07 DIAGNOSIS — Z79890 Hormone replacement therapy (postmenopausal): Secondary | ICD-10-CM | POA: Diagnosis not present

## 2018-01-07 DIAGNOSIS — L900 Lichen sclerosus et atrophicus: Secondary | ICD-10-CM | POA: Diagnosis not present

## 2018-01-21 DIAGNOSIS — N3281 Overactive bladder: Secondary | ICD-10-CM | POA: Insufficient documentation

## 2018-01-28 DIAGNOSIS — M25551 Pain in right hip: Secondary | ICD-10-CM | POA: Diagnosis not present

## 2018-01-28 DIAGNOSIS — M25561 Pain in right knee: Secondary | ICD-10-CM | POA: Diagnosis not present

## 2018-02-03 DIAGNOSIS — M25661 Stiffness of right knee, not elsewhere classified: Secondary | ICD-10-CM | POA: Diagnosis not present

## 2018-02-03 DIAGNOSIS — M7061 Trochanteric bursitis, right hip: Secondary | ICD-10-CM | POA: Diagnosis not present

## 2018-02-06 DIAGNOSIS — M7061 Trochanteric bursitis, right hip: Secondary | ICD-10-CM | POA: Diagnosis not present

## 2018-02-06 DIAGNOSIS — M25661 Stiffness of right knee, not elsewhere classified: Secondary | ICD-10-CM | POA: Diagnosis not present

## 2018-02-10 DIAGNOSIS — M7061 Trochanteric bursitis, right hip: Secondary | ICD-10-CM | POA: Diagnosis not present

## 2018-02-10 DIAGNOSIS — M25661 Stiffness of right knee, not elsewhere classified: Secondary | ICD-10-CM | POA: Diagnosis not present

## 2018-02-13 DIAGNOSIS — M25661 Stiffness of right knee, not elsewhere classified: Secondary | ICD-10-CM | POA: Diagnosis not present

## 2018-02-13 DIAGNOSIS — M7061 Trochanteric bursitis, right hip: Secondary | ICD-10-CM | POA: Diagnosis not present

## 2018-02-16 DIAGNOSIS — M7061 Trochanteric bursitis, right hip: Secondary | ICD-10-CM | POA: Diagnosis not present

## 2018-02-16 DIAGNOSIS — M25661 Stiffness of right knee, not elsewhere classified: Secondary | ICD-10-CM | POA: Diagnosis not present

## 2018-02-20 DIAGNOSIS — M25661 Stiffness of right knee, not elsewhere classified: Secondary | ICD-10-CM | POA: Diagnosis not present

## 2018-02-20 DIAGNOSIS — M7061 Trochanteric bursitis, right hip: Secondary | ICD-10-CM | POA: Diagnosis not present

## 2018-02-23 DIAGNOSIS — Z23 Encounter for immunization: Secondary | ICD-10-CM | POA: Diagnosis not present

## 2018-03-30 DIAGNOSIS — M25561 Pain in right knee: Secondary | ICD-10-CM | POA: Diagnosis not present

## 2018-04-16 DIAGNOSIS — H903 Sensorineural hearing loss, bilateral: Secondary | ICD-10-CM | POA: Diagnosis not present

## 2018-05-04 DIAGNOSIS — M79643 Pain in unspecified hand: Secondary | ICD-10-CM | POA: Diagnosis not present

## 2018-05-04 DIAGNOSIS — R5383 Other fatigue: Secondary | ICD-10-CM | POA: Diagnosis not present

## 2018-05-04 DIAGNOSIS — M1990 Unspecified osteoarthritis, unspecified site: Secondary | ICD-10-CM | POA: Diagnosis not present

## 2018-05-04 DIAGNOSIS — E559 Vitamin D deficiency, unspecified: Secondary | ICD-10-CM | POA: Diagnosis not present

## 2018-05-04 DIAGNOSIS — M810 Age-related osteoporosis without current pathological fracture: Secondary | ICD-10-CM | POA: Diagnosis not present

## 2018-05-21 IMAGING — MG 2D DIGITAL SCREENING UNILATERAL LEFT MAMMOGRAM WITH CAD AND ADJU
6 series · 6 of 14 positions shown · non-contrast
Comparison: Previous exam(s).

CLINICAL DATA: Screening.

EXAM:
2D DIGITAL SCREENING UNILATERAL LEFT MAMMOGRAM WITH CAD AND ADJUNCT
TOMO

[L CC synth-2D]
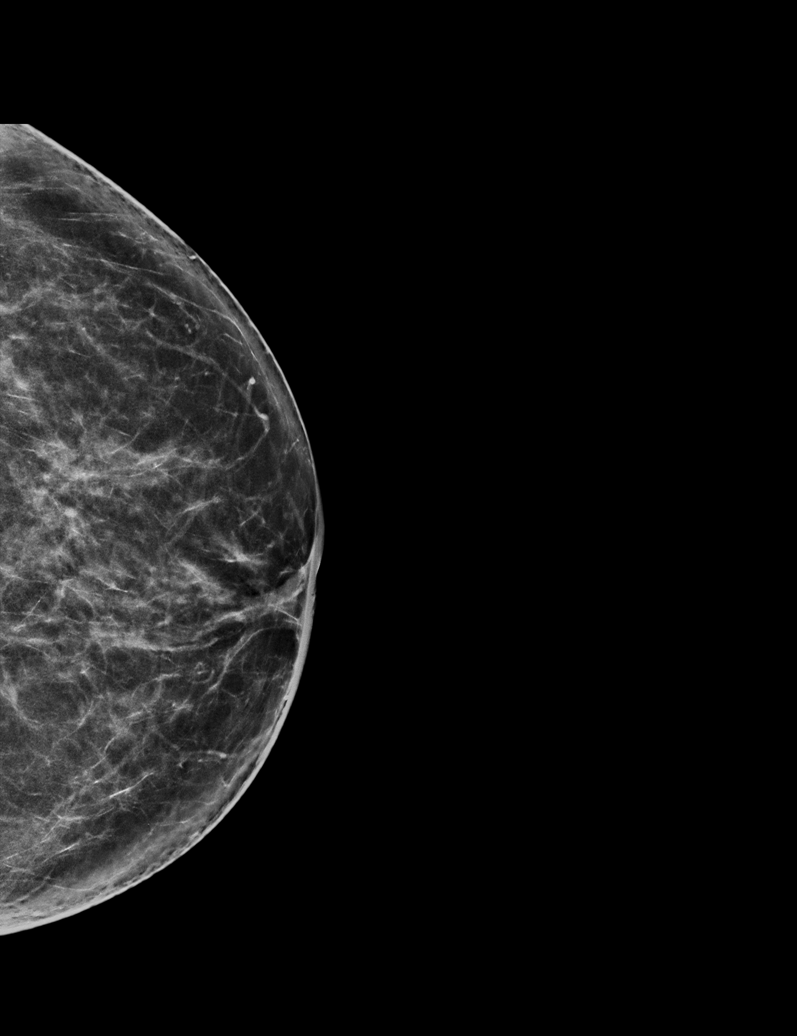

[L MLO]
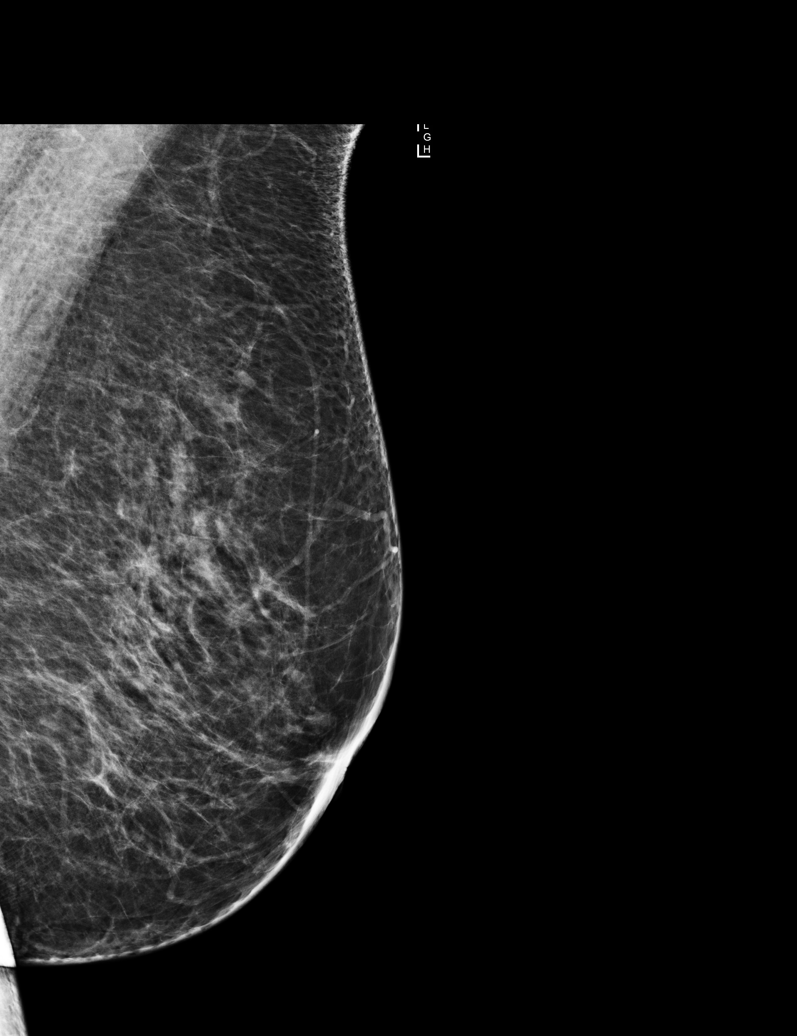

[L CC]
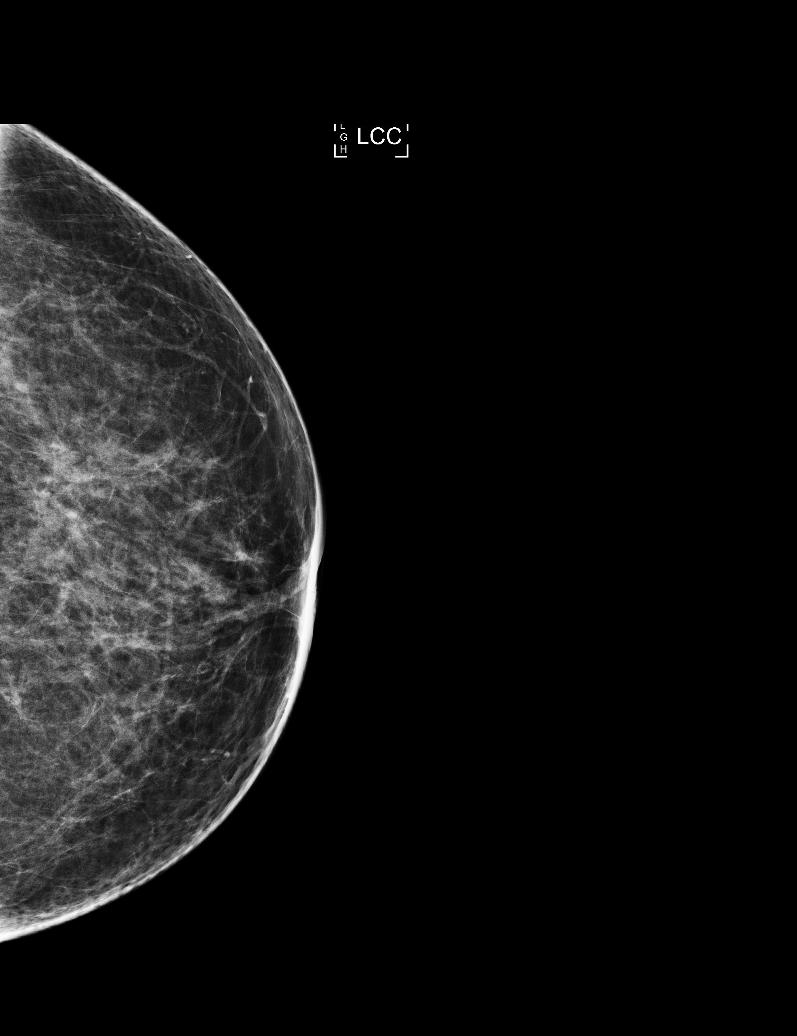

[L MLO synth-2D]
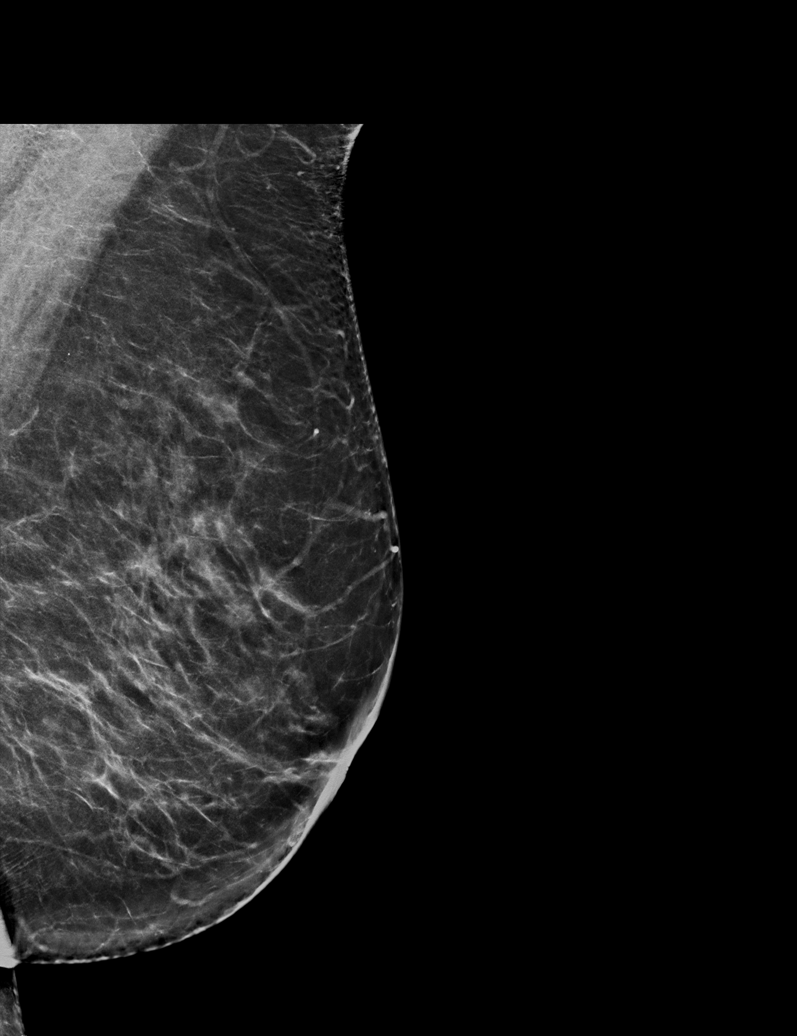

[L CC tomo · tomo slice 35/68.0]
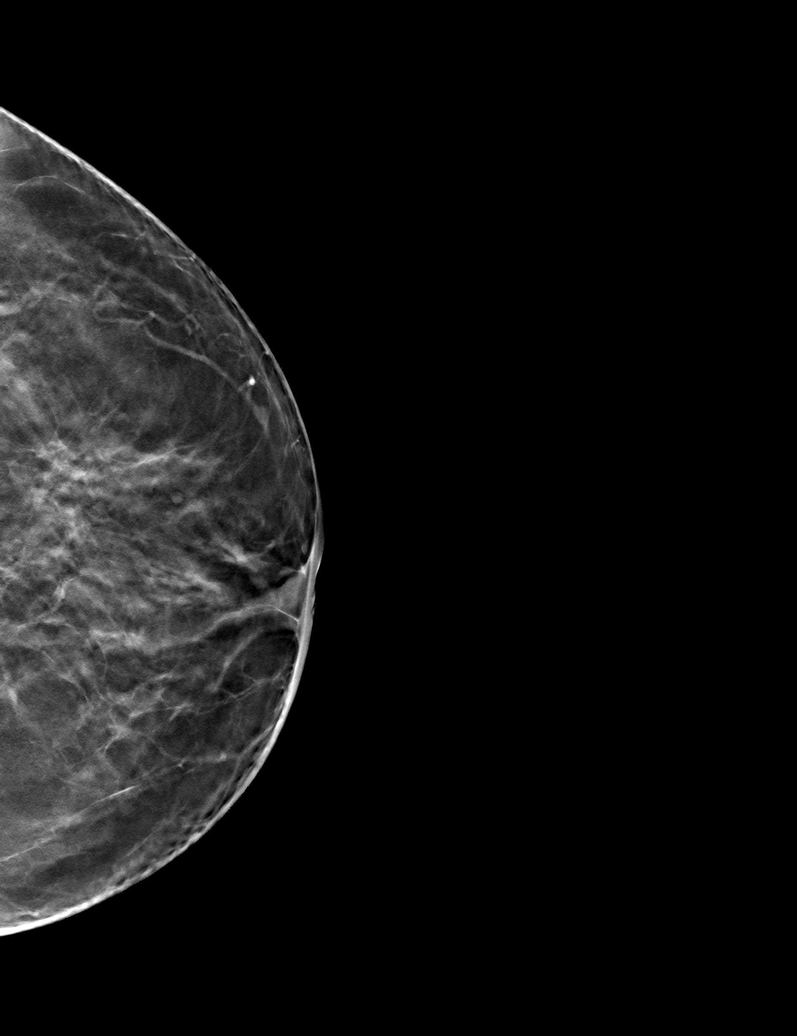

[L MLO tomo · tomo slice 38/75.0]
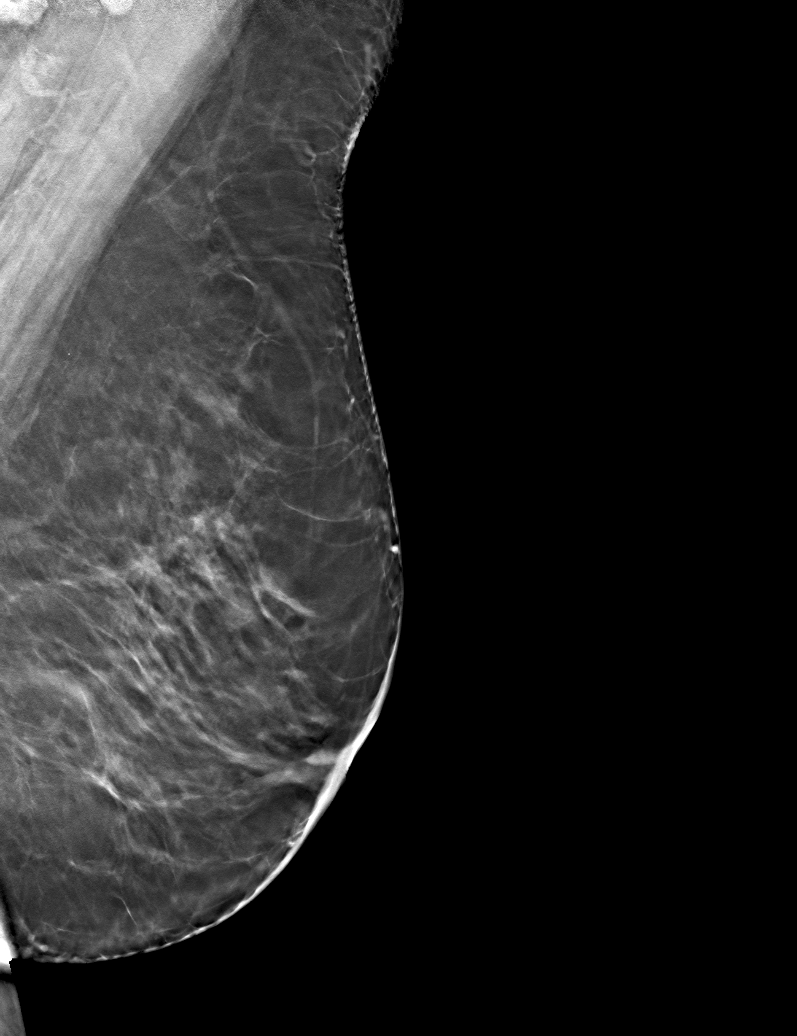

[6 of 14 positions shown; findings below may reference images not displayed]

ACR Breast Density Category c: The breast tissue is heterogeneously
dense, which may obscure small masses.
FINDINGS: There are no findings suspicious for malignancy. Images were
processed with CAD.
IMPRESSION: No mammographic evidence of malignancy. A result letter of this
screening mammogram will be mailed directly to the patient.

RECOMMENDATION:
Screening mammogram in one year. (Code:OC-U-MK6)

BI-RADS CATEGORY  1: Negative.

## 2018-05-28 ENCOUNTER — Other Ambulatory Visit: Payer: Self-pay | Admitting: Orthopaedic Surgery

## 2018-05-28 DIAGNOSIS — M25661 Stiffness of right knee, not elsewhere classified: Secondary | ICD-10-CM

## 2018-05-28 DIAGNOSIS — R350 Frequency of micturition: Secondary | ICD-10-CM | POA: Diagnosis not present

## 2018-05-28 DIAGNOSIS — N3946 Mixed incontinence: Secondary | ICD-10-CM | POA: Diagnosis not present

## 2018-05-28 DIAGNOSIS — N8110 Cystocele, unspecified: Secondary | ICD-10-CM | POA: Diagnosis not present

## 2018-05-28 DIAGNOSIS — M25461 Effusion, right knee: Secondary | ICD-10-CM

## 2018-05-29 ENCOUNTER — Other Ambulatory Visit: Payer: Self-pay

## 2018-05-29 ENCOUNTER — Ambulatory Visit
Admission: RE | Admit: 2018-05-29 | Discharge: 2018-05-29 | Disposition: A | Discharge status: Home / Self Care | Payer: Medicare Other | Source: Ambulatory Visit | Attending: Orthopaedic Surgery | Admitting: Orthopaedic Surgery

## 2018-05-29 DIAGNOSIS — M25661 Stiffness of right knee, not elsewhere classified: Secondary | ICD-10-CM

## 2018-05-29 DIAGNOSIS — I10 Essential (primary) hypertension: Secondary | ICD-10-CM | POA: Diagnosis not present

## 2018-05-29 DIAGNOSIS — M791 Myalgia, unspecified site: Secondary | ICD-10-CM | POA: Diagnosis not present

## 2018-05-29 DIAGNOSIS — M25561 Pain in right knee: Secondary | ICD-10-CM | POA: Diagnosis not present

## 2018-05-29 DIAGNOSIS — E7800 Pure hypercholesterolemia, unspecified: Secondary | ICD-10-CM | POA: Diagnosis not present

## 2018-05-29 DIAGNOSIS — M25461 Effusion, right knee: Secondary | ICD-10-CM

## 2018-05-29 DIAGNOSIS — N390 Urinary tract infection, site not specified: Secondary | ICD-10-CM | POA: Diagnosis not present

## 2018-06-03 DIAGNOSIS — M25561 Pain in right knee: Secondary | ICD-10-CM | POA: Diagnosis not present

## 2018-06-08 DIAGNOSIS — M810 Age-related osteoporosis without current pathological fracture: Secondary | ICD-10-CM | POA: Diagnosis not present

## 2018-06-09 ENCOUNTER — Telehealth: Payer: Self-pay | Admitting: Cardiovascular Disease

## 2018-06-09 NOTE — Telephone Encounter (Signed)
Pt calling to let Dr. Johnsie Cancel and Pam RN know that [facility] will soon be faxing over or calling into the office, a note to obtain cardiac clearance, for pts pending knee surgery.  Informed the pt that I will route this communication to Dr. Kyla Balzarine RN as an Juluis Rainier, upon return to the office, so she can be on the lookout for this clearance.  Offered the pt our fax numbet to have her Surgeon's office send the clearance to, and pt stated that's ok, they should already have that information, and they will get your number if needed.   Informed the pt that would be fine and to let us know if we can be of any other assistance.  Pt verbalized understanding and agrees with this plan.

## 2018-06-09 NOTE — Telephone Encounter (Signed)
New message:    Patient calling to let the doctor know that there will be a medical clearance sent over.

## 2018-06-09 NOTE — Telephone Encounter (Signed)
She is clear to have knee surgery under general anesthesia at any time

## 2018-06-25 DIAGNOSIS — X58XXXA Exposure to other specified factors, initial encounter: Secondary | ICD-10-CM | POA: Diagnosis not present

## 2018-06-25 DIAGNOSIS — S83231A Complex tear of medial meniscus, current injury, right knee, initial encounter: Secondary | ICD-10-CM | POA: Diagnosis not present

## 2018-06-25 DIAGNOSIS — Y999 Unspecified external cause status: Secondary | ICD-10-CM | POA: Diagnosis not present

## 2018-06-25 DIAGNOSIS — S83271A Complex tear of lateral meniscus, current injury, right knee, initial encounter: Secondary | ICD-10-CM | POA: Diagnosis not present

## 2018-06-25 DIAGNOSIS — M170 Bilateral primary osteoarthritis of knee: Secondary | ICD-10-CM | POA: Diagnosis not present

## 2018-06-25 DIAGNOSIS — M1711 Unilateral primary osteoarthritis, right knee: Secondary | ICD-10-CM | POA: Diagnosis not present

## 2018-07-03 DIAGNOSIS — Z9889 Other specified postprocedural states: Secondary | ICD-10-CM | POA: Diagnosis not present

## 2018-07-09 DIAGNOSIS — N3946 Mixed incontinence: Secondary | ICD-10-CM | POA: Diagnosis not present

## 2018-07-09 DIAGNOSIS — R350 Frequency of micturition: Secondary | ICD-10-CM | POA: Diagnosis not present

## 2018-07-22 DIAGNOSIS — Z9889 Other specified postprocedural states: Secondary | ICD-10-CM | POA: Diagnosis not present

## 2018-07-27 DIAGNOSIS — R262 Difficulty in walking, not elsewhere classified: Secondary | ICD-10-CM | POA: Diagnosis not present

## 2018-07-27 DIAGNOSIS — M25561 Pain in right knee: Secondary | ICD-10-CM | POA: Diagnosis not present

## 2018-07-27 DIAGNOSIS — Z9889 Other specified postprocedural states: Secondary | ICD-10-CM | POA: Diagnosis not present

## 2018-07-27 DIAGNOSIS — Z4789 Encounter for other orthopedic aftercare: Secondary | ICD-10-CM | POA: Diagnosis not present

## 2018-07-27 DIAGNOSIS — M25661 Stiffness of right knee, not elsewhere classified: Secondary | ICD-10-CM | POA: Diagnosis not present

## 2018-07-29 DIAGNOSIS — Z4789 Encounter for other orthopedic aftercare: Secondary | ICD-10-CM | POA: Diagnosis not present

## 2018-07-29 DIAGNOSIS — M25661 Stiffness of right knee, not elsewhere classified: Secondary | ICD-10-CM | POA: Diagnosis not present

## 2018-07-29 DIAGNOSIS — R262 Difficulty in walking, not elsewhere classified: Secondary | ICD-10-CM | POA: Diagnosis not present

## 2018-08-03 DIAGNOSIS — R262 Difficulty in walking, not elsewhere classified: Secondary | ICD-10-CM | POA: Diagnosis not present

## 2018-08-03 DIAGNOSIS — Z4789 Encounter for other orthopedic aftercare: Secondary | ICD-10-CM | POA: Diagnosis not present

## 2018-08-03 DIAGNOSIS — M25661 Stiffness of right knee, not elsewhere classified: Secondary | ICD-10-CM | POA: Diagnosis not present

## 2018-08-05 DIAGNOSIS — M25661 Stiffness of right knee, not elsewhere classified: Secondary | ICD-10-CM | POA: Diagnosis not present

## 2018-08-05 DIAGNOSIS — Z4789 Encounter for other orthopedic aftercare: Secondary | ICD-10-CM | POA: Diagnosis not present

## 2018-08-05 DIAGNOSIS — R262 Difficulty in walking, not elsewhere classified: Secondary | ICD-10-CM | POA: Diagnosis not present

## 2018-08-10 DIAGNOSIS — R262 Difficulty in walking, not elsewhere classified: Secondary | ICD-10-CM | POA: Diagnosis not present

## 2018-08-10 DIAGNOSIS — Z4789 Encounter for other orthopedic aftercare: Secondary | ICD-10-CM | POA: Diagnosis not present

## 2018-08-10 DIAGNOSIS — M25661 Stiffness of right knee, not elsewhere classified: Secondary | ICD-10-CM | POA: Diagnosis not present

## 2018-08-13 DIAGNOSIS — R351 Nocturia: Secondary | ICD-10-CM | POA: Diagnosis not present

## 2018-08-13 DIAGNOSIS — N3946 Mixed incontinence: Secondary | ICD-10-CM | POA: Diagnosis not present

## 2018-08-14 DIAGNOSIS — M1711 Unilateral primary osteoarthritis, right knee: Secondary | ICD-10-CM | POA: Diagnosis not present

## 2018-08-14 DIAGNOSIS — Z9889 Other specified postprocedural states: Secondary | ICD-10-CM | POA: Diagnosis not present

## 2018-09-04 DIAGNOSIS — M1711 Unilateral primary osteoarthritis, right knee: Secondary | ICD-10-CM | POA: Diagnosis not present

## 2018-09-11 DIAGNOSIS — M1711 Unilateral primary osteoarthritis, right knee: Secondary | ICD-10-CM | POA: Diagnosis not present

## 2018-09-23 DIAGNOSIS — M1711 Unilateral primary osteoarthritis, right knee: Secondary | ICD-10-CM | POA: Diagnosis not present

## 2018-10-02 DIAGNOSIS — Z23 Encounter for immunization: Secondary | ICD-10-CM | POA: Diagnosis not present

## 2018-10-16 DIAGNOSIS — N8110 Cystocele, unspecified: Secondary | ICD-10-CM | POA: Diagnosis not present

## 2018-10-16 DIAGNOSIS — N3946 Mixed incontinence: Secondary | ICD-10-CM | POA: Diagnosis not present

## 2018-10-23 DIAGNOSIS — M1711 Unilateral primary osteoarthritis, right knee: Secondary | ICD-10-CM | POA: Diagnosis not present

## 2018-10-30 DIAGNOSIS — H04123 Dry eye syndrome of bilateral lacrimal glands: Secondary | ICD-10-CM | POA: Diagnosis not present

## 2018-10-30 DIAGNOSIS — H52223 Regular astigmatism, bilateral: Secondary | ICD-10-CM | POA: Diagnosis not present

## 2018-10-30 DIAGNOSIS — H43813 Vitreous degeneration, bilateral: Secondary | ICD-10-CM | POA: Diagnosis not present

## 2018-10-30 DIAGNOSIS — H524 Presbyopia: Secondary | ICD-10-CM | POA: Diagnosis not present

## 2018-10-30 DIAGNOSIS — H35033 Hypertensive retinopathy, bilateral: Secondary | ICD-10-CM | POA: Diagnosis not present

## 2018-11-23 DIAGNOSIS — E7800 Pure hypercholesterolemia, unspecified: Secondary | ICD-10-CM | POA: Diagnosis not present

## 2018-11-23 DIAGNOSIS — E559 Vitamin D deficiency, unspecified: Secondary | ICD-10-CM | POA: Diagnosis not present

## 2018-11-23 DIAGNOSIS — M810 Age-related osteoporosis without current pathological fracture: Secondary | ICD-10-CM | POA: Diagnosis not present

## 2018-11-23 DIAGNOSIS — I10 Essential (primary) hypertension: Secondary | ICD-10-CM | POA: Diagnosis not present

## 2018-11-27 NOTE — Progress Notes (Signed)
Cardiology Office Note   Date:  12/02/2018   ID:  SIMRANPREET FALZON, DOB 04-Apr-1935, MRN [id number]  PCP:  Deland Pretty, MD  Cardiologist:  Dr. Johnsie Cancel    No chief complaint on file.     History of Present Illness:  83 y.o. f/u atypical chest pain. First seen 2011 pain in setting of pneumonia. TTE with LVH EF 60% Myovue normal no ischemia.  Calcium score 10/23/15  Was 41 50th percentile for age and sex. Benign RM/lingular nodules thought benign stable since 2014 On statin Labs followed by Dr Shelia Media. Two drug Rx for HTN  08/10/14 Left knee arthroscopy Dr Maureen Ralphs for medial meniscus tear Now seeing Daldorf and may need TKR  05/08/17 laparoscopic cholecystectomy Dr Lucia Gaskins   No cardiac complaints  .     Past Medical History:  Diagnosis Date  . Acne rosacea   . Arthritis   . Breast cancer [facility])    Has had a prior right right mastectomy and chemotherapy  . Cardiomegaly    Normal echocardiogram and cardiac workup by Dr. Johnsie Cancel  . Chest pain, unspecified   . Hyperlipidemia   . Hypertension   . Irritable bladder   . Lymphedema of arm    Right, post lumph node infection  . Osteopenia   . Pneumonia    5 years ago  . Polyp of gallbladder   . Thyroid nodule     Past Surgical History:  Procedure Laterality Date  . ABDOMINAL HYSTERECTOMY    . CHOLECYSTECTOMY N/A 05/08/2017   Procedure: LAPAROSCOPIC CHOLECYSTECTOMY WITH INTRAOPERATIVE CHOLANGIOGRAM;  Surgeon: Alphonsa Overall, MD;  Location: [facility];  Service: General;  Laterality: N/A;  ERAS PATHWAY  . EYE SURGERY     cataract surgery bilateral  . KNEE ARTHROSCOPY Left 08/10/2014   Procedure: LEFT KNEE ARTHROSCOPY WITH MENSCIAL DEBRIDEMENT CONDROPLASTY;  Surgeon: Gaynelle Arabian, MD;  Location: [facility];  Service: Orthopedics;  Laterality: Left;  Marland Kitchen MASTECTOMY     right breast  . RECONSTRUCTION BREAST W/ TRAM FLAP  1997   after right mastectomy  . TONSILLECTOMY    . VESICOVAGINAL FISTULA CLOSURE W/ TAH       Current Outpatient  Medications  Medication Sig Dispense Refill  . amLODipine (NORVASC) 5 MG tablet Take 5 mg by mouth at bedtime.    Marland Kitchen aspirin EC 81 MG tablet Take 81 mg by mouth at bedtime.    . Calcium-Vitamin D (CALTRATE 600 PLUS-VIT D PO) Take 2 tablets by mouth at bedtime.     . cephALEXin (KEFLEX) 250 MG capsule Take 1 capsule by mouth as directed.    . clobetasol ointment (TEMOVATE) [id number]-123456789 % Apply 1 application topically 2 (two) times daily as needed (for lichen sclerosus).     Marland Kitchen denosumab (PROLIA) 60 MG/ML SOLN injection Inject 60 mg into the skin every 6 (six) months.     . DHA-EPA-Eve Prim Oil-Vit E (EPA/GLA) CAPS Take 2 capsules by mouth at bedtime. HydroEye Softgels Dry Eye Relief    . losartan (COZAAR) 100 MG tablet Take 100 mg by mouth at bedtime.     Marland Kitchen MYRBETRIQ 50 MG TB24 tablet Take 50 mg by mouth daily.    . Omega-3 Fatty Acids (FISH OIL) 1000 MG CAPS Take 1,000 mg by mouth at bedtime.     Vladimir Faster Glycol-Propyl Glycol (SYSTANE) 0.4-0.3 % SOLN Place 1 drop into both eyes 3 (three) times daily as needed (for dry eyes.).    Marland Kitchen rosuvastatin (CRESTOR) 20 MG tablet Take 10  mg by mouth at bedtime.     No current facility-administered medications for this visit.     Allergies:   Sulfonamide derivatives    Social History:  The patient  reports that she has never smoked. She has never used smokeless tobacco. She reports that she does not drink alcohol or use drugs.   Family History:  The patient's family history includes Congestive Heart Failure in her mother; Coronary artery disease in her unknown relative; Heart attack (age of onset: 47) in her father; Rheum arthritis in her mother.    ROS:  General:no colds or fevers, no weight changes Skin:no rashes or ulcers HEENT:no blurred vision, no congestion CV:see HPI PUL:see HPI GI:no diarrhea constipation or melena, no indigestion GU:no hematuria, no dysuria MS:no joint pain, no claudication Neuro:no syncope, no lightheadedness Endo:no diabetes,  no thyroid disease She has had her flu vaccine  Wt Readings from Last 3 Encounters:  12/02/18 178 lb (80.7 kg)  11/19/17 168 lb 8 oz (76.4 kg)  05/08/17 173 lb 9.6 oz (78.7 kg)     PHYSICAL EXAM: VS:  BP 130/80   Pulse 72   Ht 5' 5.5" (1.664 m)   Wt 178 lb (80.7 kg)   SpO2 93%   BMI 29.17 kg/m  , BMI Body mass index is 29.17 kg/m.   Affect appropriate Healthy:  appears stated age 64: normal Neck supple with no adenopathy JVP normal no bruits no thyromegaly Lungs clear with no wheezing and good diaphragmatic motion Heart:  S1/S2 no murmur, no rub, gallop or click PMI normal Abdomen: benighn, BS positve, no tenderness, no AAA no bruit.  No HSM or HJR Distal pulses intact with no bruits No edema Neuro non-focal Skin warm and dry No muscular weakness     EKG:   12/02/18 SR rate 72 PR 222 otherwise normal     Recent Labs: No results found for requested labs within last 8760 hours.    Lipid Panel    Component Value Date/Time   CHOL  07/10/2009 0340    130        ATP III CLASSIFICATION:  <200     mg/dL   Desirable  200-239  mg/dL   Borderline High  >=240    mg/dL   High          TRIG 161 (H) 07/10/2009 0340   HDL 43 07/10/2009 0340   CHOLHDL 3.0 07/10/2009 0340   VLDL 32 07/10/2009 0340   LDLCALC  07/10/2009 0340    55        Total Cholesterol/HDL:CHD Risk Coronary Heart Disease Risk Table                     Men   Women  1/2 Average Risk   3.4   3.3  Average Risk       5.0   4.4  2 X Average Risk   9.6   7.1  3 X Average Risk  23.4   11.0        Use the calculated Patient Ratio above and the CHD Risk Table to determine the patient's CHD Risk.        ATP III CLASSIFICATION (LDL):  <100     mg/dL   Optimal  100-129  mg/dL   Near or Above                    Optimal  130-159  mg/dL   Borderline  160-189  mg/dL  High  >190     mg/dL   Very High       Other studies Reviewed: Additional studies/ records that were reviewed today include: see  above.   ASSESSMENT AND PLAN:  1.  CAD  Relatively low calcium score 4 years ago without angina and on statin.  PCP to check labs.  Observe   2.  HTN controlled continue current meds  3.  HLD controlled and to have labs Wed with PCP.   4. Lung nodules RM/lingular stable over years presumed benign   5. Ortho: had left knee arthroscopy in 2016 f/u Dahldorf may need TKR Cardiac status ok for general anesthesia  6. First Degree Block:  AV f/u ecg in a year    [employer]

## 2018-11-30 DIAGNOSIS — E875 Hyperkalemia: Secondary | ICD-10-CM | POA: Diagnosis not present

## 2018-11-30 DIAGNOSIS — I10 Essential (primary) hypertension: Secondary | ICD-10-CM | POA: Diagnosis not present

## 2018-11-30 DIAGNOSIS — E7800 Pure hypercholesterolemia, unspecified: Secondary | ICD-10-CM | POA: Diagnosis not present

## 2018-11-30 DIAGNOSIS — R0989 Other specified symptoms and signs involving the circulatory and respiratory systems: Secondary | ICD-10-CM | POA: Diagnosis not present

## 2018-11-30 DIAGNOSIS — Z0000 Encounter for general adult medical examination without abnormal findings: Secondary | ICD-10-CM | POA: Diagnosis not present

## 2018-12-01 DIAGNOSIS — N3946 Mixed incontinence: Secondary | ICD-10-CM | POA: Diagnosis not present

## 2018-12-02 ENCOUNTER — Encounter: Payer: Self-pay | Admitting: Cardiovascular Disease

## 2018-12-02 ENCOUNTER — Ambulatory Visit (INDEPENDENT_AMBULATORY_CARE_PROVIDER_SITE_OTHER): Payer: Medicare Other | Admitting: Cardiovascular Disease

## 2018-12-02 ENCOUNTER — Other Ambulatory Visit: Payer: Self-pay

## 2018-12-02 VITALS — BP 130/80 | HR 72 | Ht 65.5 in | Wt 178.0 lb

## 2018-12-02 DIAGNOSIS — I2510 Atherosclerotic heart disease of native coronary artery without angina pectoris: Secondary | ICD-10-CM

## 2018-12-02 NOTE — Patient Instructions (Addendum)

## 2018-12-14 DIAGNOSIS — M810 Age-related osteoporosis without current pathological fracture: Secondary | ICD-10-CM | POA: Diagnosis not present

## 2018-12-14 DIAGNOSIS — D1801 Hemangioma of skin and subcutaneous tissue: Secondary | ICD-10-CM | POA: Diagnosis not present

## 2018-12-14 DIAGNOSIS — D225 Melanocytic nevi of trunk: Secondary | ICD-10-CM | POA: Diagnosis not present

## 2018-12-14 DIAGNOSIS — L821 Other seborrheic keratosis: Secondary | ICD-10-CM | POA: Diagnosis not present

## 2018-12-14 DIAGNOSIS — Z85828 Personal history of other malignant neoplasm of skin: Secondary | ICD-10-CM | POA: Diagnosis not present

## 2019-02-15 DIAGNOSIS — L900 Lichen sclerosus et atrophicus: Secondary | ICD-10-CM | POA: Diagnosis not present

## 2019-02-19 DIAGNOSIS — N3946 Mixed incontinence: Secondary | ICD-10-CM | POA: Diagnosis not present

## 2019-02-19 DIAGNOSIS — R351 Nocturia: Secondary | ICD-10-CM | POA: Diagnosis not present

## 2019-04-07 ENCOUNTER — Other Ambulatory Visit: Payer: Self-pay | Admitting: Orthopaedic Surgery

## 2019-04-07 ENCOUNTER — Telehealth: Payer: Self-pay | Admitting: *Deleted

## 2019-04-07 DIAGNOSIS — M25561 Pain in right knee: Secondary | ICD-10-CM | POA: Diagnosis not present

## 2019-04-07 NOTE — Telephone Encounter (Signed)
   Primary Cardiologist: Jenkins Rouge, MD  Chart reviewed as part of pre-operative protocol coverage. Patient was contacted 04/07/2019 in reference to pre-operative risk assessment for pending surgery as outlined below.  [name] was last seen on 11/2018 by Dr. Johnsie Cancel.  Since that day, [name] has done well. Patient can hold ASA for 5-7 days prior and resume post surgery.   Therefore, based on ACC/AHA guidelines, the patient would be at acceptable risk for the planned procedure without further cardiovascular testing.   I will route this recommendation to the requesting party via Epic fax function and remove from pre-op pool.  Please call with questions.  [place], [place] 04/07/2019, 1:40 PM

## 2019-04-07 NOTE — Telephone Encounter (Signed)
   [facility] Pre-operative Risk Assessment    Request for surgical clearance:  1. What type of surgery is being performed? RIGHT KNEE ARTHROPLASTY   2. When is this surgery scheduled? 05/04/19   3. What type of clearance is required (medical clearance vs. Pharmacy clearance to hold med vs. Both)? MEDICAL  4. Are there any medications that need to be held prior to surgery and how long? ASA   5. Practice name and name of physician performing surgery? [facility]; DR. Collier Salina DALLDORF   6. What is your office phone number [phone]   7.   What is your office fax number [phone]  8.   Anesthesia type (None, local, MAC, general) ? SPINAL   Julaine Hua 04/07/2019, 12:34 PM  _________________________________________________________________   (provider comments below)

## 2019-04-16 NOTE — Telephone Encounter (Signed)
[facility] was calling because they had not heard any updates on the status of this clearance. Please fax clearance to [phone]

## 2019-04-20 DIAGNOSIS — E7800 Pure hypercholesterolemia, unspecified: Secondary | ICD-10-CM | POA: Diagnosis not present

## 2019-04-20 DIAGNOSIS — Z01818 Encounter for other preprocedural examination: Secondary | ICD-10-CM | POA: Diagnosis not present

## 2019-04-22 ENCOUNTER — Other Ambulatory Visit (HOSPITAL_COMMUNITY): Payer: Medicare Other

## 2019-04-22 ENCOUNTER — Ambulatory Visit (HOSPITAL_COMMUNITY)
Admission: RE | Admit: 2019-04-22 | Discharge: 2019-04-22 | Disposition: A | Discharge status: Home / Self Care | Payer: Medicare Other | Source: Ambulatory Visit | Attending: Orthopaedic Surgery | Admitting: Orthopaedic Surgery

## 2019-04-22 ENCOUNTER — Encounter (HOSPITAL_COMMUNITY): Payer: Self-pay

## 2019-04-22 ENCOUNTER — Other Ambulatory Visit: Payer: Self-pay

## 2019-04-22 ENCOUNTER — Encounter (HOSPITAL_COMMUNITY)
Admission: RE | Admit: 2019-04-22 | Discharge: 2019-04-22 | Disposition: A | Discharge status: Home / Self Care | Payer: Medicare Other | Source: Ambulatory Visit | Attending: Orthopaedic Surgery | Admitting: Orthopaedic Surgery

## 2019-04-22 DIAGNOSIS — Z9221 Personal history of antineoplastic chemotherapy: Secondary | ICD-10-CM | POA: Insufficient documentation

## 2019-04-22 DIAGNOSIS — E785 Hyperlipidemia, unspecified: Secondary | ICD-10-CM | POA: Diagnosis not present

## 2019-04-22 DIAGNOSIS — Z9011 Acquired absence of right breast and nipple: Secondary | ICD-10-CM | POA: Insufficient documentation

## 2019-04-22 DIAGNOSIS — Z7982 Long term (current) use of aspirin: Secondary | ICD-10-CM | POA: Insufficient documentation

## 2019-04-22 DIAGNOSIS — I10 Essential (primary) hypertension: Secondary | ICD-10-CM | POA: Diagnosis not present

## 2019-04-22 DIAGNOSIS — Z79899 Other long term (current) drug therapy: Secondary | ICD-10-CM | POA: Insufficient documentation

## 2019-04-22 DIAGNOSIS — Z853 Personal history of malignant neoplasm of breast: Secondary | ICD-10-CM | POA: Insufficient documentation

## 2019-04-22 DIAGNOSIS — M1711 Unilateral primary osteoarthritis, right knee: Secondary | ICD-10-CM | POA: Insufficient documentation

## 2019-04-22 DIAGNOSIS — M8580 Other specified disorders of bone density and structure, unspecified site: Secondary | ICD-10-CM | POA: Diagnosis not present

## 2019-04-22 DIAGNOSIS — Z01818 Encounter for other preprocedural examination: Secondary | ICD-10-CM | POA: Insufficient documentation

## 2019-04-22 LAB — CBC WITH DIFFERENTIAL/PLATELET
Abs Immature Granulocytes: 0.01 10*3/uL (ref 0.00–0.07)
Basophils Absolute: 0.1 10*3/uL (ref 0.0–0.1)
Basophils Relative: 1 %
Eosinophils Absolute: 0.2 10*3/uL (ref 0.0–0.5)
Eosinophils Relative: 3 %
HCT: 45.6 % (ref 36.0–46.0)
Hemoglobin: 14.9 g/dL (ref 12.0–15.0)
Immature Granulocytes: 0 %
Lymphocytes Relative: 35 %
Lymphs Abs: 1.9 10*3/uL (ref 0.7–4.0)
MCH: 30.3 pg (ref 26.0–34.0)
MCHC: 32.7 g/dL (ref 30.0–36.0)
MCV: 92.9 fL (ref 80.0–100.0)
Monocytes Absolute: 0.5 10*3/uL (ref 0.1–1.0)
Monocytes Relative: 9 %
Neutro Abs: 2.7 10*3/uL (ref 1.7–7.7)
Neutrophils Relative %: 52 %
Platelets: 265 10*3/uL (ref 150–400)
RBC: 4.91 MIL/uL (ref 3.87–5.11)
RDW: 14.8 % (ref 11.5–15.5)
WBC: 5.3 10*3/uL (ref 4.0–10.5)
nRBC: 0 % (ref 0.0–0.2)

## 2019-04-22 LAB — BASIC METABOLIC PANEL
Anion gap: 11 (ref 5–15)
BUN: 21 mg/dL (ref 8–23)
CO2: 25 mmol/L (ref 22–32)
Calcium: 9.2 mg/dL (ref 8.9–10.3)
Chloride: 105 mmol/L (ref 98–111)
Creatinine, Ser: 0.71 mg/dL (ref 0.44–1.00)
GFR calc Af Amer: >60 mL/min (ref >60)
GFR calc non Af Amer: >60 mL/min (ref >60)
Glucose, Bld: 99 mg/dL (ref 70–99)
Potassium: 4.3 mmol/L (ref 3.5–5.1)
Sodium: 141 mmol/L (ref 135–145)

## 2019-04-22 LAB — APTT: aPTT: 52 seconds — ABNORMAL HIGH (ref 24–36)

## 2019-04-22 LAB — URINALYSIS, ROUTINE W REFLEX MICROSCOPIC
Bilirubin Urine: NEGATIVE
Glucose, UA: NEGATIVE mg/dL
Hgb urine dipstick: NEGATIVE
Ketones, ur: NEGATIVE mg/dL
Leukocytes,Ua: NEGATIVE
Nitrite: NEGATIVE
Protein, ur: NEGATIVE mg/dL
Specific Gravity, Urine: 1.008 (ref 1.005–1.030)
pH: 6 (ref 5.0–8.0)

## 2019-04-22 LAB — ABO/RH: ABO/RH(D): O POS

## 2019-04-22 LAB — TYPE AND SCREEN
ABO/RH(D): O POS
Antibody Screen: NEGATIVE

## 2019-04-22 LAB — SURGICAL PCR SCREEN
MRSA, PCR: NEGATIVE
Staphylococcus aureus: NEGATIVE

## 2019-04-22 LAB — PROTIME-INR
INR: 1 (ref 0.8–1.2)
Prothrombin Time: 12.7 seconds (ref 11.4–15.2)

## 2019-04-22 NOTE — Progress Notes (Signed)
PCP - Dr. Audie Pinto. LOV: 12/02/18 Cardiologist - Dr. Johnsie Cancel P. LOV: 12/02/18. Clearance. : 04/07/19. EPIC  Chest x-ray -  EKG - 12/02/18 Stress Test -  ECHO -  Cardiac Cath -   Sleep Study -  CPAP -   Fasting Blood Sugar -  Checks Blood Sugar _____ times a day  Blood Thinner Instructions: Aspirin Instructions: RN advised pt. To call surgeon's office for instructions. Last Dose:  Anesthesia review:   Patient denies shortness of breath, fever, cough and chest pain at PAT appointment   Patient verbalized understanding of instructions that were given to them at the PAT appointment. Patient was also instructed that they will need to review over the PAT instructions again at home before surgery.

## 2019-04-22 NOTE — Progress Notes (Signed)
Ptt done 04/22/19 routed via epic to Dr Rhona Raider.

## 2019-04-22 NOTE — Patient Instructions (Signed)
DUE TO COVID-19 ONLY ONE VISITOR IS ALLOWED TO COME WITH YOU AND STAY IN THE WAITING ROOM ONLY DURING PRE OP AND PROCEDURE DAY OF SURGERY. THE 1 VISITOR MAY VISIT WITH YOU AFTER SURGERY IN YOUR PRIVATE ROOM DURING VISITING HOURS ONLY!  YOU NEED TO HAVE A COVID 19 TEST ON: 05/01/19 @  9:00 am, THIS TEST MUST BE DONE BEFORE SURGERY, COME  [place], [address] , [id number].  ([place]) ONCE YOUR COVID TEST IS COMPLETED, PLEASE BEGIN THE QUARANTINE INSTRUCTIONS AS OUTLINED IN YOUR HANDOUT.                GEROLDINE GOODNO    Your procedure is scheduled on: 05/04/19   Report to [facility]  Entrance   Report to admitting at: 10:00 AM     Call this number if you have problems the morning of surgery [phone]    Remember:   [place], NO [place].     Take these medicines the morning of surgery with A SIP OF WATER: amlodipine,keflex,solifenacin.                                 You may not have any metal on your body including hair pins and              piercings  Do not wear jewelry, make-up, lotions, powders or perfumes, deodorant             Do not wear nail polish on your fingernails.  Do not shave  48 hours prior to surgery.                Do not bring valuables to the hospital. [place].  Contacts, dentures or bridgework may not be worn into surgery.  Leave suitcase in the car. After surgery it may be brought to your room.     Patients discharged the day of surgery will not be allowed to drive home. IF YOU ARE HAVING SURGERY AND GOING HOME THE SAME DAY, YOU MUST HAVE AN ADULT TO DRIVE YOU HOME AND BE WITH YOU FOR 24 HOURS. YOU MAY GO HOME BY TAXI OR UBER OR ORTHERWISE, BUT AN ADULT MUST ACCOMPANY YOU HOME AND STAY WITH YOU FOR 24 HOURS.  Name and phone number of your driver:  Special Instructions: N/A              Please  read over the following fact sheets you were given: _____________________________________________________________________             NO SOLID FOOD AFTER MIDNIGHT THE NIGHT PRIOR TO SURGERY: 9:00 am. NOTHING BY MOUTH EXCEPT CLEAR LIQUIDS UNTIL . PLEASE FINISH ENSURE DRINK PER SURGEON ORDER  WHICH NEEDS TO BE COMPLETED AT: 9:00 am .   CLEAR LIQUID DIET   Foods Allowed                                                                     Foods Excluded  Coffee and tea, regular and decaf                             liquids that you cannot  Plain Jell-O any favor except red or purple                                           see through such as: Fruit ices (not with fruit pulp)                                     milk, soups, orange juice  Iced Popsicles                                    All solid food Carbonated beverages, regular and diet                                    Cranberry, grape and apple juices Sports drinks like Gatorade Lightly seasoned clear broth or consume(fat free) Sugar, honey syrup  Sample Menu Breakfast                                Lunch                                     Supper Cranberry juice                    Beef broth                            Chicken broth Jell-O                                     Grape juice                           Apple juice Coffee or tea                        Jell-O                                      Popsicle                                                Coffee or tea                        Coffee or tea  _____________________________________________________________________  [facility] - Preparing for Surgery Before surgery, you can play an important role.  Because skin is not sterile, your skin needs to be  as free of germs as possible.  You can reduce the number of germs on your skin by washing with CHG (chlorahexidine gluconate) soap before surgery.  CHG is an antiseptic cleaner which kills germs and bonds with the skin to  continue killing germs even after washing. Please DO NOT use if you have an allergy to CHG or antibacterial soaps.  If your skin becomes reddened/irritated stop using the CHG and inform your nurse when you arrive at Short Stay. Do not shave (including legs and underarms) for at least 48 hours prior to the first CHG shower.  You may shave your face/neck. Please follow these instructions carefully:  1.  Shower with CHG Soap the night before surgery and the  morning of Surgery.  2.  If you choose to wash your hair, wash your hair first as usual with your  normal  shampoo.  3.  After you shampoo, rinse your hair and body thoroughly to remove the  shampoo.                           4.  Use CHG as you would any other liquid soap.  You can apply chg directly  to the skin and wash                       Gently with a scrungie or clean washcloth.  5.  Apply the CHG Soap to your body ONLY FROM THE NECK DOWN.   Do not use on face/ open                           Wound or open sores. Avoid contact with eyes, ears mouth and genitals (private parts).                       Wash face,  Genitals (private parts) with your normal soap.             6.  Wash thoroughly, paying special attention to the area where your surgery  will be performed.  7.  Thoroughly rinse your body with warm water from the neck down.  8.  DO NOT shower/wash with your normal soap after using and rinsing off  the CHG Soap.                9.  Pat yourself dry with a clean towel.            10.  Wear clean pajamas.            11.  Place clean sheets on your bed the night of your first shower and do not  sleep with pets. Day of Surgery : Do not apply any lotions/deodorants the morning of surgery.  Please wear clean clothes to the hospital/surgery center.  FAILURE TO FOLLOW THESE INSTRUCTIONS MAY RESULT IN THE CANCELLATION OF YOUR SURGERY PATIENT SIGNATURE_________________________________  NURSE  SIGNATURE__________________________________  ________________________________________________________________________   Adam Phenix  An incentive spirometer is a tool that can help keep your lungs clear and active. This tool measures how well you are filling your lungs with each breath. Taking long deep breaths may help reverse or decrease the chance of developing breathing (pulmonary) problems (especially infection) following:  A long period of time when you are unable to move or be active. BEFORE THE PROCEDURE   If the spirometer includes an indicator to show your best  effort, your nurse or respiratory therapist will set it to a desired goal.  If possible, sit up straight or lean slightly forward. Try not to slouch.  Hold the incentive spirometer in an upright position. INSTRUCTIONS FOR USE  1. Sit on the edge of your bed if possible, or sit up as far as you can in bed or on a chair. 2. Hold the incentive spirometer in an upright position. 3. Breathe out normally. 4. Place the mouthpiece in your mouth and seal your lips tightly around it. 5. Breathe in slowly and as deeply as possible, raising the piston or the ball toward the top of the column. 6. Hold your breath for 3-5 seconds or for as long as possible. Allow the piston or ball to fall to the bottom of the column. 7. Remove the mouthpiece from your mouth and breathe out normally. 8. Rest for a few seconds and repeat Steps 1 through 7 at least 10 times every 1-2 hours when you are awake. Take your time and take a few normal breaths between deep breaths. 9. The spirometer may include an indicator to show your best effort. Use the indicator as a goal to work toward during each repetition. 10. After each set of 10 deep breaths, practice coughing to be sure your lungs are clear. If you have an incision (the cut made at the time of surgery), support your incision when coughing by placing a pillow or rolled up towels firmly  against it. Once you are able to get out of bed, walk around indoors and cough well. You may stop using the incentive spirometer when instructed by your caregiver.  RISKS AND COMPLICATIONS  Take your time so you do not get dizzy or light-headed.  If you are in pain, you may need to take or ask for pain medication before doing incentive spirometry. It is harder to take a deep breath if you are having pain. AFTER USE  Rest and breathe slowly and easily.  It can be helpful to keep track of a log of your progress. Your caregiver can provide you with a simple table to help with this. If you are using the spirometer at home, follow these instructions: [place] IF:   You are having difficultly using the spirometer.  You have trouble using the spirometer as often as instructed.  Your pain medication is not giving enough relief while using the spirometer.  You develop fever of 100.5 F (38.1 C) or higher. SEEK IMMEDIATE MEDICAL CARE IF:   You cough up bloody sputum that had not been present before.  You develop fever of 102 F (38.9 C) or greater.  You develop worsening pain at or near the incision site. MAKE SURE YOU:   Understand these instructions.  Will watch your condition.  Will get help right away if you are not doing well or get worse. Document Released: 05/20/2006 Document Revised: 04/01/2011 Document Reviewed: 07/21/2006 [facility] Patient Information 2014 [place], [place].   ________________________________________________________________________

## 2019-04-27 NOTE — Progress Notes (Signed)
Anesthesia Chart Review   Case: [id number] Date/Time: 05/04/19 1205   Procedure: RIGHT TOTAL KNEE ARTHROPLASTY (Right Knee)   Anesthesia type: Spinal   Pre-op diagnosis: RIGHT KNEE DEGENERATIVE JOINT DISEASE   Location: [facility] ROOM 06 / [facility]   Surgeons: Melrose Nakayama, MD      DISCUSSION:84 y.o. never smoker with h/o HLD, HTN, breast cancer s/o right mastectomy and chemo, right knee DJD scheduled for above procedure 05/04/19 with Dr. Melrose Nakayama.   Pt followed by cardiology due to atypical chest pain.  Myovue negative for ischemia 2011, low calcium score,  EF 60% with no valvular abnormalities.  Last seen by cardiology 12/02/2018, stable at this visit.  Clearance received which states, "Chart reviewed as part of pre-operative protocol coverage. Patient was contacted 04/07/2019 in reference to pre-operative risk assessment for pending surgery as outlined below.  [name] was last seen on 11/2018 by Dr. Johnsie Cancel.  Since that day, [name] has done well. Patient can hold ASA for 5-7 days prior and resume post surgery.  Therefore, based on ACC/AHA guidelines, the patient would be at acceptable risk for the planned procedure without further cardiovascular testing."  Anticipate pt can proceed with planned procedure barring acute status change.   VS: BP (!) 164/79   Pulse 72   Temp 36.8 C (Oral)   Resp 18   Ht 5\' 5"  (1.651 m)   Wt 80.3 kg   SpO2 96%   BMI 29.46 kg/m   PROVIDERS: Deland Pretty, MD is PCP   Jenkins Rouge, MD is Cardiologist  LABS: Labs reviewed: Acceptable for surgery. (all labs ordered are listed, but only abnormal results are displayed)  Labs Reviewed  APTT - Abnormal; Notable for the following components:      Result Value   aPTT 52 (*)    All other components within normal limits  SURGICAL PCR SCREEN  BASIC METABOLIC PANEL  CBC WITH DIFFERENTIAL/PLATELET  PROTIME-INR  URINALYSIS, ROUTINE W REFLEX MICROSCOPIC  TYPE AND SCREEN  ABO/RH      IMAGES:   EKG: 12/02/2018 Rate 72 bpm  Sinus rhythm with 1st degree AV block   CV: Echo 07/10/2009 Study Conclusions   - Left ventricle: The cavity size was normal. Wall thickness was   increased increased in a pattern of mild to moderate LVH. The   estimated ejection fraction was 60%. Wall motion was normal; there   were no regional wall motion abnormalities. Left ventricular   diastolic function parameters were normal.  - Aortic valve: Sclerosis without stenosis.   Past Medical History:  Diagnosis Date  . Acne rosacea   . Arthritis   . Breast cancer [facility])    Has had a prior right right mastectomy and chemotherapy  . Cardiomegaly    Normal echocardiogram and cardiac workup by Dr. Johnsie Cancel  . Chest pain, unspecified   . Hyperlipidemia   . Hypertension   . Irritable bladder   . Lymphedema of arm    Right, post lumph node infection  . Osteopenia   . Pneumonia    5 years ago  . Polyp of gallbladder   . Thyroid nodule     Past Surgical History:  Procedure Laterality Date  . ABDOMINAL HYSTERECTOMY    . CHOLECYSTECTOMY N/A 05/08/2017   Procedure: LAPAROSCOPIC CHOLECYSTECTOMY WITH INTRAOPERATIVE CHOLANGIOGRAM;  Surgeon: Alphonsa Overall, MD;  Location: [facility];  Service: General;  Laterality: N/A;  ERAS PATHWAY  . EYE SURGERY     cataract surgery bilateral  .  KNEE ARTHROSCOPY Left 08/10/2014   Procedure: LEFT KNEE ARTHROSCOPY WITH MENSCIAL DEBRIDEMENT CONDROPLASTY;  Surgeon: Gaynelle Arabian, MD;  Location: [facility];  Service: Orthopedics;  Laterality: Left;  Marland Kitchen MASTECTOMY     right breast  . RECONSTRUCTION BREAST W/ TRAM FLAP  1997   after right mastectomy  . TONSILLECTOMY    . VESICOVAGINAL FISTULA CLOSURE W/ TAH      MEDICATIONS: . amLODipine (NORVASC) 5 MG tablet  . aspirin EC 81 MG tablet  . Calcium-Vitamin D (CALTRATE 600 PLUS-VIT D PO)  . cephALEXin (KEFLEX) 250 MG capsule  . clobetasol ointment (TEMOVATE) 0.05 %  . denosumab (PROLIA) 60 MG/ML SOLN  injection  . DHA-EPA-Eve Prim Oil-Vit E (EPA/GLA) CAPS  . losartan (COZAAR) 100 MG tablet  . Omega-3 Fatty Acids (FISH OIL) 1200 MG CPDR  . rosuvastatin (CRESTOR) 10 MG tablet  . solifenacin (VESICARE) 5 MG tablet   No current facility-administered medications for this encounter.    Maia Plan [facility] Pre-Surgical Testing [phone]  04/27/19  11:59 AM

## 2019-04-28 NOTE — Care Plan (Signed)
Ortho Bundle Case Management Note  Patient Details  Name: [name] MRN: [id number] Date of Birth: August 09, 1935  Met with patient prior to surgery. She plans to discharge to home with family. HHPT referral to [facility]. OPPT set up with [place]. Has equipment at home. Patient and MD in agreement with plan. Choice offered.                DME Arranged:    DME Agency:     HH Arranged:  PT HH Agency:  [facility] (formerly [facility])  Additional Comments: Please contact me with any questions of if this plan should need to change.  Ladell Heads,  [address] Specialist  [phone] 04/28/2019, 1:52 PM

## 2019-04-29 ENCOUNTER — Other Ambulatory Visit (HOSPITAL_COMMUNITY): Payer: Medicare Other

## 2019-05-01 ENCOUNTER — Other Ambulatory Visit (HOSPITAL_COMMUNITY)
Admission: RE | Admit: 2019-05-01 | Discharge: 2019-05-01 | Disposition: A | Discharge status: Home / Self Care | Payer: Medicare Other | Source: Ambulatory Visit | Attending: Orthopaedic Surgery | Admitting: Orthopaedic Surgery

## 2019-05-01 DIAGNOSIS — Z20822 Contact with and (suspected) exposure to covid-19: Secondary | ICD-10-CM | POA: Insufficient documentation

## 2019-05-01 DIAGNOSIS — Z01812 Encounter for preprocedural laboratory examination: Secondary | ICD-10-CM | POA: Diagnosis not present

## 2019-05-01 LAB — SARS CORONAVIRUS 2 (TAT 6-24 HRS): SARS Coronavirus 2: NEGATIVE

## 2019-05-03 MED ORDER — TRANEXAMIC ACID 1000 MG/10ML IV SOLN
2000.0000 mg | INTRAVENOUS | Status: DC
  Filled 2019-05-03: qty 20

## 2019-05-03 MED ORDER — BUPIVACAINE LIPOSOME 1.3 % IJ SUSP
20.0000 mL | Freq: Once | INTRAMUSCULAR | Status: DC
  Filled 2019-05-03: qty 20

## 2019-05-03 NOTE — H&P (Signed)
TOTAL KNEE ADMISSION H&P  Patient is being admitted for right total knee arthroplasty.  Subjective:  Chief Complaint:right knee pain.  HPI: [name], 84 y.o. female, has a history of pain and functional disability in the right knee due to arthritis and has failed non-surgical conservative treatments for greater than 12 weeks to includeNSAID's and/or analgesics, corticosteriod injections, viscosupplementation injections, flexibility and strengthening excercises, supervised PT with diminished ADL's post treatment, use of assistive devices, weight reduction as appropriate and activity modification.  Onset of symptoms was gradual, starting 5 years ago with gradually worsening course since that time. The patient noted prior procedures on the knee to include  arthroscopy on the right knee(s).  Patient currently rates pain in the right knee(s) at 10 out of 10 with activity. Patient has night pain, worsening of pain with activity and weight bearing, pain that interferes with activities of daily living, crepitus and joint swelling.  Patient has evidence of subchondral cysts, subchondral sclerosis, periarticular osteophytes and joint space narrowing by imaging studies. There is no active infection.  Patient Active Problem List   Diagnosis Date Noted  . Acute medial meniscal tear 08/10/2014  . Pulmonary nodules 04/07/2013  . First degree atrioventricular block 08/27/2011  . POLYP, GALLBLADDER 09/18/2009  . Other specified disorders of bladder 09/18/2009  . ACNE ROSACEA 09/18/2009  . OSTEOPENIA 09/18/2009  . THYROID NODULE, HX OF 09/18/2009  . CHEST PAIN-UNSPECIFIED 07/27/2009  . HYPERLIPIDEMIA 07/25/2009  . Essential hypertension 07/25/2009   Past Medical History:  Diagnosis Date  . Acne rosacea   . Arthritis   . Breast cancer [facility])    Has had a prior right right mastectomy and chemotherapy  . Cardiomegaly    Normal echocardiogram and cardiac workup by Dr. Johnsie Cancel  . Chest pain,  unspecified   . Hyperlipidemia   . Hypertension   . Irritable bladder   . Lymphedema of arm    Right, post lumph node infection  . Osteopenia   . Pneumonia    5 years ago  . Polyp of gallbladder   . Thyroid nodule     Past Surgical History:  Procedure Laterality Date  . ABDOMINAL HYSTERECTOMY    . CHOLECYSTECTOMY N/A 05/08/2017   Procedure: LAPAROSCOPIC CHOLECYSTECTOMY WITH INTRAOPERATIVE CHOLANGIOGRAM;  Surgeon: Alphonsa Overall, MD;  Location: [facility];  Service: General;  Laterality: N/A;  ERAS PATHWAY  . EYE SURGERY     cataract surgery bilateral  . KNEE ARTHROSCOPY Left 08/10/2014   Procedure: LEFT KNEE ARTHROSCOPY WITH MENSCIAL DEBRIDEMENT CONDROPLASTY;  Surgeon: Gaynelle Arabian, MD;  Location: [facility];  Service: Orthopedics;  Laterality: Left;  Marland Kitchen MASTECTOMY     right breast  . RECONSTRUCTION BREAST W/ TRAM FLAP  1997   after right mastectomy  . TONSILLECTOMY    . VESICOVAGINAL FISTULA CLOSURE W/ TAH      Current Facility-Administered Medications  Medication Dose Route Frequency Provider Last Rate Last Admin  . [START ON 05/04/2019] bupivacaine liposome (EXPAREL) 1.3 % injection 266 mg  20 mL Other Once Melrose Nakayama, MD      . Derrill Memo ON 05/04/2019] tranexamic acid (CYKLOKAPRON) 2,000 mg in sodium chloride 0.9 % 50 mL Topical Application  123XX123 mg Topical To OR Melrose Nakayama, MD       Current Outpatient Medications  Medication Sig Dispense Refill Last Dose  . amLODipine (NORVASC) 5 MG tablet Take 5 mg by mouth at bedtime.     Marland Kitchen aspirin EC 81 MG tablet Take 81 mg by mouth at bedtime.     Marland Kitchen  Calcium-Vitamin D (CALTRATE 600 PLUS-VIT D PO) Take 2 tablets by mouth at bedtime.      . cephALEXin (KEFLEX) 250 MG capsule Take 250 mg by mouth daily.      . clobetasol ointment (TEMOVATE) [id number] % Apply 1 application topically 2 (two) times daily as needed (for lichen sclerosus).      Marland Kitchen denosumab (PROLIA) 60 MG/ML SOLN injection Inject 60 mg into the skin every 6 (six) months.      .  DHA-EPA-Eve Prim Oil-Vit E (EPA/GLA) CAPS Take 2 capsules by mouth at bedtime. HydroEye Softgels Dry Eye Relief     . losartan (COZAAR) 100 MG tablet Take 100 mg by mouth at bedtime.      . Omega-3 Fatty Acids (FISH OIL) 1200 MG CPDR Take 1,200 mg by mouth at bedtime.      . rosuvastatin (CRESTOR) 10 MG tablet Take 10 mg by mouth at bedtime.      . solifenacin (VESICARE) 5 MG tablet Take 5 mg by mouth daily.      Allergies  Allergen Reactions  . Sulfonamide Derivatives Anaphylaxis and Swelling    Facial/tongue/throat swelling.  [facility] [Tobramycin-Dexamethasone] Itching    redness    Social History   Tobacco Use  . Smoking status: Never Smoker  . Smokeless tobacco: Never Used  Substance Use Topics  . Alcohol use: No    Family History  Problem Relation Age of Onset  . Rheum arthritis Mother   . Congestive Heart Failure Mother   . Heart attack Father 58  . Coronary artery disease Other      Review of Systems  Musculoskeletal: Positive for arthralgias.       Right knee  All other systems reviewed and are negative.   Objective:  Physical Exam  Constitutional: She is oriented to person, place, and time. She appears well-developed and well-nourished.  HENT:  Head: Normocephalic and atraumatic.  Eyes: Pupils are equal, round, and reactive to light.  Cardiovascular: Normal rate and regular rhythm.  Respiratory: Effort normal.  GI: Soft.  Musculoskeletal:     Cervical back: Normal range of motion.     Comments: Right knee motion remains 0-115.  She has trace effusion with medial joint line pain and crepitation.  Hip motion is full today.  Straight leg raise is negative.  Sensation and motor function are intact in her feet with palpable pulses on both sides.    Neurological: She is alert and oriented to person, place, and time.  Skin: Skin is warm and dry.  Psychiatric: She has a normal mood and affect. Her behavior is normal. Judgment and thought content normal.     Vital signs in last 24 hours:    Labs:   Estimated body mass index is 29.46 kg/m as calculated from the following:   Height as of 04/22/19: 5\' 5"  (1.651 m).   Weight as of 04/22/19: 80.3 kg.   Imaging Review Plain radiographs demonstrate severe degenerative joint disease of the right knee(s). The overall alignment isneutral. The bone quality appears to be good for age and reported activity level.      Assessment/Plan:  End stage arthritis, right knee   The patient history, physical examination, clinical judgment of the provider and imaging studies are consistent with end stage degenerative joint disease of the right knee(s) and total knee arthroplasty is deemed medically necessary. The treatment options including medical management, injection therapy arthroscopy and arthroplasty were discussed at length. The risks and benefits of total knee  arthroplasty were presented and reviewed. The risks due to aseptic loosening, infection, stiffness, patella tracking problems, thromboembolic complications and other imponderables were discussed. The patient acknowledged the explanation, agreed to proceed with the plan and consent was signed. Patient is being admitted for inpatient treatment for surgery, pain control, PT, OT, prophylactic antibiotics, VTE prophylaxis, progressive ambulation and ADL's and discharge planning. The patient is planning to be discharged home with home health services     Patient's anticipated LOS is less than 2 midnights, meeting these requirements: - Younger than 27 - Lives within 1 hour of care - Has a competent adult at home to recover with post-op recover - NO history of  - Chronic pain requiring opiods  - Diabetes  - Coronary Artery Disease  - Heart failure  - Heart attack  - Stroke  - DVT/VTE  - Cardiac arrhythmia  - Respiratory Failure/COPD  - Renal failure  - Anemia  - Advanced Liver disease

## 2019-05-04 ENCOUNTER — Ambulatory Visit (HOSPITAL_COMMUNITY): Payer: Medicare Other | Admitting: Physician Assistant

## 2019-05-04 ENCOUNTER — Encounter (HOSPITAL_COMMUNITY)
Admission: RE | Disposition: A | Discharge status: Home / Self Care | Payer: Self-pay | Source: Ambulatory Visit | Attending: Orthopaedic Surgery

## 2019-05-04 ENCOUNTER — Observation Stay (HOSPITAL_COMMUNITY)
Admission: RE | Admit: 2019-05-04 | Discharge: 2019-05-05 | Disposition: A | Discharge status: Home / Self Care | Payer: Medicare Other | Source: Ambulatory Visit | Attending: Orthopaedic Surgery | Admitting: Orthopaedic Surgery

## 2019-05-04 ENCOUNTER — Other Ambulatory Visit: Payer: Self-pay

## 2019-05-04 ENCOUNTER — Ambulatory Visit (HOSPITAL_COMMUNITY): Payer: Medicare Other | Admitting: Anesthesiology

## 2019-05-04 ENCOUNTER — Encounter (HOSPITAL_COMMUNITY): Payer: Self-pay | Admitting: Orthopaedic Surgery

## 2019-05-04 DIAGNOSIS — Z79899 Other long term (current) drug therapy: Secondary | ICD-10-CM | POA: Insufficient documentation

## 2019-05-04 DIAGNOSIS — Z888 Allergy status to other drugs, medicaments and biological substances status: Secondary | ICD-10-CM | POA: Diagnosis not present

## 2019-05-04 DIAGNOSIS — Z8261 Family history of arthritis: Secondary | ICD-10-CM | POA: Insufficient documentation

## 2019-05-04 DIAGNOSIS — I10 Essential (primary) hypertension: Secondary | ICD-10-CM | POA: Insufficient documentation

## 2019-05-04 DIAGNOSIS — M8580 Other specified disorders of bone density and structure, unspecified site: Secondary | ICD-10-CM | POA: Insufficient documentation

## 2019-05-04 DIAGNOSIS — Z882 Allergy status to sulfonamides status: Secondary | ICD-10-CM | POA: Diagnosis not present

## 2019-05-04 DIAGNOSIS — M1711 Unilateral primary osteoarthritis, right knee: Secondary | ICD-10-CM | POA: Diagnosis not present

## 2019-05-04 DIAGNOSIS — E785 Hyperlipidemia, unspecified: Secondary | ICD-10-CM | POA: Insufficient documentation

## 2019-05-04 DIAGNOSIS — Z7982 Long term (current) use of aspirin: Secondary | ICD-10-CM | POA: Diagnosis not present

## 2019-05-04 DIAGNOSIS — Z9011 Acquired absence of right breast and nipple: Secondary | ICD-10-CM | POA: Diagnosis not present

## 2019-05-04 DIAGNOSIS — G8918 Other acute postprocedural pain: Secondary | ICD-10-CM | POA: Diagnosis not present

## 2019-05-04 DIAGNOSIS — Z8249 Family history of ischemic heart disease and other diseases of the circulatory system: Secondary | ICD-10-CM | POA: Diagnosis not present

## 2019-05-04 HISTORY — PX: TOTAL KNEE ARTHROPLASTY: SHX125

## 2019-05-04 HISTORY — DX: Unilateral primary osteoarthritis, right knee: M17.11

## 2019-05-04 SURGERY — ARTHROPLASTY, KNEE, TOTAL
Anesthesia: Spinal | Site: Knee | Laterality: Right

## 2019-05-04 MED ORDER — TRANEXAMIC ACID-NACL 1000-0.7 MG/100ML-% IV SOLN
1000.0000 mg | INTRAVENOUS | Status: AC
  Administered 2019-05-04: 1000 mg via INTRAVENOUS
  Filled 2019-05-04: qty 100

## 2019-05-04 MED ORDER — DOCUSATE SODIUM 100 MG PO CAPS
100.0000 mg | ORAL_CAPSULE | Freq: Two times a day (BID) | ORAL | Status: DC
  Administered 2019-05-04 – 2019-05-05 (×2): 100 mg via ORAL
  Filled 2019-05-04 (×2): qty 1

## 2019-05-04 MED ORDER — ALUM & MAG HYDROXIDE-SIMETH 200-200-20 MG/5ML PO SUSP: 30.0000 mL | ORAL | Status: DC | PRN

## 2019-05-04 MED ORDER — PROPOFOL 1000 MG/100ML IV EMUL
INTRAVENOUS | Status: AC
  Filled 2019-05-04: qty 100

## 2019-05-04 MED ORDER — TRANEXAMIC ACID-NACL 1000-0.7 MG/100ML-% IV SOLN
1000.0000 mg | Freq: Once | INTRAVENOUS | Status: AC
  Administered 2019-05-04: 1000 mg via INTRAVENOUS
  Filled 2019-05-04: qty 100

## 2019-05-04 MED ORDER — ONDANSETRON HCL 4 MG PO TABS: 4.0000 mg | ORAL_TABLET | Freq: Four times a day (QID) | ORAL | Status: DC | PRN

## 2019-05-04 MED ORDER — CEPHALEXIN 250 MG PO CAPS
250.0000 mg | ORAL_CAPSULE | Freq: Every day | ORAL | Status: DC
  Administered 2019-05-04 – 2019-05-05 (×2): 250 mg via ORAL
  Filled 2019-05-04 (×2): qty 1

## 2019-05-04 MED ORDER — CHLORHEXIDINE GLUCONATE 4 % EX LIQD: 60.0000 mL | Freq: Once | CUTANEOUS | Status: DC

## 2019-05-04 MED ORDER — CEFAZOLIN SODIUM-DEXTROSE 2-4 GM/100ML-% IV SOLN
2.0000 g | Freq: Four times a day (QID) | INTRAVENOUS | Status: AC
  Administered 2019-05-04 – 2019-05-05 (×2): 2 g via INTRAVENOUS
  Filled 2019-05-04 (×2): qty 100

## 2019-05-04 MED ORDER — BUPIVACAINE-EPINEPHRINE (PF) 0.5% -1:200000 IJ SOLN
INTRAMUSCULAR | Status: DC | PRN
  Administered 2019-05-04: 30 mL via PERINEURAL

## 2019-05-04 MED ORDER — HYDROCODONE-ACETAMINOPHEN 5-325 MG PO TABS
1.0000 | ORAL_TABLET | ORAL | Status: DC | PRN
  Administered 2019-05-04: 21:00:00 2 via ORAL
  Administered 2019-05-04: 17:00:00 1 via ORAL
  Filled 2019-05-04 (×2): qty 2

## 2019-05-04 MED ORDER — LOSARTAN POTASSIUM 50 MG PO TABS
100.0000 mg | ORAL_TABLET | Freq: Every day | ORAL | Status: DC
  Administered 2019-05-04: 21:00:00 100 mg via ORAL
  Filled 2019-05-04: qty 2

## 2019-05-04 MED ORDER — ROPIVACAINE HCL 7.5 MG/ML IJ SOLN
INTRAMUSCULAR | Status: DC | PRN
  Administered 2019-05-04: 20 mL via PERINEURAL

## 2019-05-04 MED ORDER — METHOCARBAMOL 500 MG IVPB - SIMPLE MED
500.0000 mg | Freq: Four times a day (QID) | INTRAVENOUS | Status: DC | PRN
  Administered 2019-05-04: 500 mg via INTRAVENOUS
  Filled 2019-05-04: qty 50

## 2019-05-04 MED ORDER — ONDANSETRON HCL 4 MG/2ML IJ SOLN
INTRAMUSCULAR | Status: AC
  Filled 2019-05-04: qty 2

## 2019-05-04 MED ORDER — ROSUVASTATIN CALCIUM 10 MG PO TABS
10.0000 mg | ORAL_TABLET | Freq: Every day | ORAL | Status: DC
  Administered 2019-05-04: 10 mg via ORAL
  Filled 2019-05-04: qty 1

## 2019-05-04 MED ORDER — TRANEXAMIC ACID 1000 MG/10ML IV SOLN
INTRAVENOUS | Status: DC | PRN
  Administered 2019-05-04: 2000 mg via TOPICAL

## 2019-05-04 MED ORDER — BUPIVACAINE IN DEXTROSE 0.75-8.25 % IT SOLN
INTRATHECAL | Status: DC | PRN
  Administered 2019-05-04: 1.7 mL via INTRATHECAL

## 2019-05-04 MED ORDER — MORPHINE SULFATE (PF) 2 MG/ML IV SOLN
0.5000 mg | INTRAVENOUS | Status: DC | PRN
  Administered 2019-05-04: 23:00:00 0.5 mg via INTRAVENOUS
  Filled 2019-05-04: qty 1

## 2019-05-04 MED ORDER — HYDROCODONE-ACETAMINOPHEN 5-325 MG PO TABS
ORAL_TABLET | ORAL | Status: AC
  Filled 2019-05-04: qty 1

## 2019-05-04 MED ORDER — PROPOFOL 10 MG/ML IV BOLUS
INTRAVENOUS | Status: DC | PRN
  Administered 2019-05-04 (×2): 10 mg via INTRAVENOUS

## 2019-05-04 MED ORDER — PROPOFOL 500 MG/50ML IV EMUL
INTRAVENOUS | Status: DC | PRN
  Administered 2019-05-04: 100 ug/kg/min via INTRAVENOUS

## 2019-05-04 MED ORDER — ONDANSETRON HCL 4 MG/2ML IJ SOLN
INTRAMUSCULAR | Status: DC | PRN
  Administered 2019-05-04: 4 mg via INTRAVENOUS

## 2019-05-04 MED ORDER — DEXAMETHASONE SODIUM PHOSPHATE 10 MG/ML IJ SOLN
INTRAMUSCULAR | Status: DC | PRN
  Administered 2019-05-04: 8 mg via INTRAVENOUS

## 2019-05-04 MED ORDER — ONDANSETRON HCL 4 MG/2ML IJ SOLN: 4.0000 mg | Freq: Four times a day (QID) | INTRAMUSCULAR | Status: DC | PRN

## 2019-05-04 MED ORDER — METHOCARBAMOL 500 MG IVPB - SIMPLE MED
INTRAVENOUS | Status: AC
  Filled 2019-05-04: qty 50

## 2019-05-04 MED ORDER — AMLODIPINE BESYLATE 5 MG PO TABS
5.0000 mg | ORAL_TABLET | Freq: Every day | ORAL | Status: DC
  Administered 2019-05-04: 5 mg via ORAL
  Filled 2019-05-04: qty 1

## 2019-05-04 MED ORDER — METOCLOPRAMIDE HCL 5 MG/ML IJ SOLN: 5.0000 mg | Freq: Three times a day (TID) | INTRAMUSCULAR | Status: DC | PRN

## 2019-05-04 MED ORDER — MENTHOL 3 MG MT LOZG: 1.0000 | LOZENGE | OROMUCOSAL | Status: DC | PRN

## 2019-05-04 MED ORDER — PHENYLEPHRINE HCL-NACL 10-0.9 MG/250ML-% IV SOLN
INTRAVENOUS | Status: DC | PRN
  Administered 2019-05-04: 25 ug/min via INTRAVENOUS

## 2019-05-04 MED ORDER — POVIDONE-IODINE 10 % EX SWAB
2.0000 "application " | Freq: Once | CUTANEOUS | Status: AC
  Administered 2019-05-04: 2 via TOPICAL

## 2019-05-04 MED ORDER — ASPIRIN 81 MG PO CHEW
81.0000 mg | CHEWABLE_TABLET | Freq: Two times a day (BID) | ORAL | Status: DC
  Administered 2019-05-05: 09:00:00 81 mg via ORAL
  Filled 2019-05-04: qty 1

## 2019-05-04 MED ORDER — EPA/GLA PO CAPS: 2.0000 | ORAL_CAPSULE | Freq: Every day | ORAL | Status: DC

## 2019-05-04 MED ORDER — METHOCARBAMOL 500 MG PO TABS
500.0000 mg | ORAL_TABLET | Freq: Four times a day (QID) | ORAL | Status: DC | PRN
  Administered 2019-05-04 – 2019-05-05 (×2): 500 mg via ORAL
  Filled 2019-05-04 (×2): qty 1

## 2019-05-04 MED ORDER — 0.9 % SODIUM CHLORIDE (POUR BTL) OPTIME
TOPICAL | Status: DC | PRN
  Administered 2019-05-04: 1000 mL

## 2019-05-04 MED ORDER — DIPHENHYDRAMINE HCL 12.5 MG/5ML PO ELIX: 12.5000 mg | ORAL_SOLUTION | ORAL | Status: DC | PRN

## 2019-05-04 MED ORDER — SODIUM CHLORIDE (PF) 0.9 % IJ SOLN
INTRAMUSCULAR | Status: DC | PRN
  Administered 2019-05-04: 30 mL via INTRAVENOUS

## 2019-05-04 MED ORDER — PHENYLEPHRINE HCL (PRESSORS) 10 MG/ML IV SOLN
INTRAVENOUS | Status: AC
  Filled 2019-05-04: qty 1

## 2019-05-04 MED ORDER — PROPOFOL 500 MG/50ML IV EMUL
INTRAVENOUS | Status: AC
  Filled 2019-05-04: qty 50

## 2019-05-04 MED ORDER — DARIFENACIN HYDROBROMIDE ER 7.5 MG PO TB24
7.5000 mg | ORAL_TABLET | Freq: Every day | ORAL | Status: DC
  Administered 2019-05-04: 21:00:00 7.5 mg via ORAL
  Filled 2019-05-04 (×3): qty 1

## 2019-05-04 MED ORDER — BUPIVACAINE-EPINEPHRINE (PF) 0.5% -1:200000 IJ SOLN
INTRAMUSCULAR | Status: AC
  Filled 2019-05-04: qty 30

## 2019-05-04 MED ORDER — CEFAZOLIN SODIUM-DEXTROSE 2-4 GM/100ML-% IV SOLN
2.0000 g | INTRAVENOUS | Status: AC
  Administered 2019-05-04: 2 g via INTRAVENOUS
  Filled 2019-05-04: qty 100

## 2019-05-04 MED ORDER — METOCLOPRAMIDE HCL 5 MG PO TABS: 5.0000 mg | ORAL_TABLET | Freq: Three times a day (TID) | ORAL | Status: DC | PRN

## 2019-05-04 MED ORDER — LACTATED RINGERS IV SOLN: INTRAVENOUS | Status: DC

## 2019-05-04 MED ORDER — FENTANYL CITRATE (PF) 100 MCG/2ML IJ SOLN
50.0000 ug | INTRAMUSCULAR | Status: DC
  Administered 2019-05-04: 11:00:00 100 ug via INTRAVENOUS
  Filled 2019-05-04: qty 2

## 2019-05-04 MED ORDER — HYDROCODONE-ACETAMINOPHEN 7.5-325 MG PO TABS
1.0000 | ORAL_TABLET | ORAL | Status: DC | PRN
  Administered 2019-05-05: 1 via ORAL
  Administered 2019-05-05: 2 via ORAL
  Filled 2019-05-04: qty 2
  Filled 2019-05-04: qty 1
  Filled 2019-05-04: qty 2

## 2019-05-04 MED ORDER — DEXAMETHASONE SODIUM PHOSPHATE 10 MG/ML IJ SOLN
INTRAMUSCULAR | Status: AC
  Filled 2019-05-04: qty 1

## 2019-05-04 MED ORDER — SODIUM CHLORIDE 0.9 % IR SOLN
Status: DC | PRN
  Administered 2019-05-04: 1000 mL

## 2019-05-04 MED ORDER — SODIUM CHLORIDE (PF) 0.9 % IJ SOLN
INTRAMUSCULAR | Status: AC
  Filled 2019-05-04: qty 50

## 2019-05-04 MED ORDER — ACETAMINOPHEN 500 MG PO TABS
500.0000 mg | ORAL_TABLET | Freq: Four times a day (QID) | ORAL | Status: DC
  Administered 2019-05-04 – 2019-05-05 (×2): 500 mg via ORAL
  Filled 2019-05-04 (×3): qty 1

## 2019-05-04 MED ORDER — FENTANYL CITRATE (PF) 100 MCG/2ML IJ SOLN: 25.0000 ug | INTRAMUSCULAR | Status: DC | PRN

## 2019-05-04 MED ORDER — PROMETHAZINE HCL 25 MG/ML IJ SOLN: 6.2500 mg | INTRAMUSCULAR | Status: DC | PRN

## 2019-05-04 MED ORDER — MIDAZOLAM HCL 2 MG/2ML IJ SOLN
1.0000 mg | INTRAMUSCULAR | Status: DC
  Administered 2019-05-04: 1 mg via INTRAVENOUS
  Filled 2019-05-04: qty 2

## 2019-05-04 MED ORDER — PHENOL 1.4 % MT LIQD: 1.0000 | OROMUCOSAL | Status: DC | PRN

## 2019-05-04 MED ORDER — PROPOFOL 10 MG/ML IV BOLUS
INTRAVENOUS | Status: AC
  Filled 2019-05-04: qty 20

## 2019-05-04 MED ORDER — BISACODYL 5 MG PO TBEC: 5.0000 mg | DELAYED_RELEASE_TABLET | Freq: Every day | ORAL | Status: DC | PRN

## 2019-05-04 MED ORDER — KETOROLAC TROMETHAMINE 15 MG/ML IJ SOLN
7.5000 mg | Freq: Four times a day (QID) | INTRAMUSCULAR | Status: AC
  Administered 2019-05-04 – 2019-05-05 (×4): 7.5 mg via INTRAVENOUS
  Filled 2019-05-04 (×4): qty 1

## 2019-05-04 MED ORDER — ACETAMINOPHEN 325 MG PO TABS: 325.0000 mg | ORAL_TABLET | Freq: Four times a day (QID) | ORAL | Status: DC | PRN

## 2019-05-04 MED ORDER — ACETAMINOPHEN 500 MG PO TABS
1000.0000 mg | ORAL_TABLET | Freq: Once | ORAL | Status: AC
  Administered 2019-05-04: 10:00:00 1000 mg via ORAL
  Filled 2019-05-04: qty 2

## 2019-05-04 MED ORDER — BUPIVACAINE LIPOSOME 1.3 % IJ SUSP
INTRAMUSCULAR | Status: DC | PRN
  Administered 2019-05-04: 20 mL

## 2019-05-04 SURGICAL SUPPLY — 53 items
ATTUNE MED DOME PAT 38 KNEE (Knees) ×1 IMPLANT
ATTUNE MED DOME PAT 38MM KNEE (Knees) ×1 IMPLANT
ATTUNE PSFEM RTSZ6 NARCEM KNEE (Femur) ×2 IMPLANT
ATTUNE PSRP INSR SZ6 7 KNEE (Insert) ×1 IMPLANT
ATTUNE PSRP INSR SZ6 7MM KNEE (Insert) ×1 IMPLANT
BAG DECANTER FOR FLEXI CONT (MISCELLANEOUS) ×3 IMPLANT
BAG ZIPLOCK 12X15 (MISCELLANEOUS) ×3 IMPLANT
BASE TIBIAL ROT PLAT SZ 5 KNEE (Knees) IMPLANT
BLADE SAGITTAL 25.0X1.19X90 (BLADE) ×2 IMPLANT
BLADE SAGITTAL 25.0X1.19X90MM (BLADE) ×1
BLADE SAW SGTL 11.0X1.19X90.0M (BLADE) ×3 IMPLANT
BNDG ELASTIC 6X10 VLCR STRL LF (GAUZE/BANDAGES/DRESSINGS) ×2 IMPLANT
BNDG ELASTIC 6X5.8 VLCR STR LF (GAUZE/BANDAGES/DRESSINGS) ×3 IMPLANT
BOOTIES KNEE HIGH SLOAN (MISCELLANEOUS) ×3 IMPLANT
BOWL SMART MIX CTS (DISPOSABLE) ×3 IMPLANT
CEMENT HV SMART SET (Cement) ×6 IMPLANT
COVER SURGICAL LIGHT HANDLE (MISCELLANEOUS) ×3 IMPLANT
COVER WAND RF STERILE (DRAPES) ×3 IMPLANT
CUFF TOURN SGL QUICK 34 (TOURNIQUET CUFF) ×3
CUFF TRNQT CYL 34X4.125X (TOURNIQUET CUFF) ×1 IMPLANT
DECANTER SPIKE VIAL GLASS SM (MISCELLANEOUS) ×6 IMPLANT
DRAPE SHEET LG 3/4 BI-LAMINATE (DRAPES) ×3 IMPLANT
DRAPE TOP 10253 STERILE (DRAPES) ×3 IMPLANT
DRAPE U-SHAPE 47X51 STRL (DRAPES) ×3 IMPLANT
DRSG AQUACEL AG ADV 3.5X10 (GAUZE/BANDAGES/DRESSINGS) ×3 IMPLANT
DURAPREP 26ML APPLICATOR (WOUND CARE) ×6 IMPLANT
ELECT REM PT RETURN 15FT ADLT (MISCELLANEOUS) ×3 IMPLANT
GLOVE BIO SURGEON STRL SZ8 (GLOVE) ×6 IMPLANT
GLOVE BIOGEL PI IND STRL 8 (GLOVE) ×2 IMPLANT
GLOVE BIOGEL PI INDICATOR 8 (GLOVE) ×4
GOWN STRL REUS W/TWL XL LVL3 (GOWN DISPOSABLE) ×6 IMPLANT
HANDPIECE INTERPULSE COAX TIP (DISPOSABLE) ×3
HOLDER FOLEY CATH W/STRAP (MISCELLANEOUS) IMPLANT
HOOD PEEL AWAY FLYTE STAYCOOL (MISCELLANEOUS) ×9 IMPLANT
KIT TURNOVER KIT A (KITS) IMPLANT
MANIFOLD NEPTUNE II (INSTRUMENTS) ×3 IMPLANT
NS IRRIG 1000ML POUR BTL (IV SOLUTION) ×3 IMPLANT
PACK TOTAL KNEE CUSTOM (KITS) ×3 IMPLANT
PAD ARMBOARD 7.5X6 YLW CONV (MISCELLANEOUS) ×3 IMPLANT
PENCIL SMOKE EVACUATOR (MISCELLANEOUS) IMPLANT
PIN DRILL FIX HALF THREAD (BIT) ×2 IMPLANT
PIN STEINMAN FIXATION KNEE (PIN) ×2 IMPLANT
PROTECTOR NERVE ULNAR (MISCELLANEOUS) ×3 IMPLANT
SET HNDPC FAN SPRY TIP SCT (DISPOSABLE) ×1 IMPLANT
SUT ETHIBOND NAB CT1 #1 30IN (SUTURE) ×6 IMPLANT
SUT VIC AB 0 CT1 36 (SUTURE) ×3 IMPLANT
SUT VIC AB 2-0 CT1 27 (SUTURE) ×3
SUT VIC AB 2-0 CT1 TAPERPNT 27 (SUTURE) ×1 IMPLANT
SUT VICRYL AB 3-0 FS1 BRD 27IN (SUTURE) ×3 IMPLANT
TIBIAL BASE ROT PLAT SZ 5 KNEE (Knees) ×3 IMPLANT
TRAY FOLEY MTR SLVR 16FR STAT (SET/KITS/TRAYS/PACK) IMPLANT
WATER STERILE IRR 1000ML POUR (IV SOLUTION) ×3 IMPLANT
WRAP KNEE MAXI GEL POST OP (GAUZE/BANDAGES/DRESSINGS) ×3 IMPLANT

## 2019-05-04 NOTE — Progress Notes (Signed)
Assisted Dr. Singer with right, ultrasound guided, adductor canal block. Side rails up, monitors on throughout procedure. See vital signs in flow sheet. Tolerated Procedure well.  

## 2019-05-04 NOTE — Anesthesia Procedure Notes (Signed)
Spinal  Patient location during procedure: OR Start time: 05/04/2019 12:28 PM End time: 05/04/2019 12:38 PM Staffing Performed: anesthesiologist  Anesthesiologist: Duane Boston, MD Preanesthetic Checklist Completed: patient identified, IV checked, risks and benefits discussed, surgical consent, monitors and equipment checked, pre-op evaluation and timeout performed Spinal Block Patient position: sitting Prep: DuraPrep Patient monitoring: cardiac monitor, continuous pulse ox and blood pressure Approach: midline Location: L2-3 Injection technique: single-shot Needle Needle type: Pencan  Needle gauge: 24 G Needle length: 9 cm Additional Notes Functioning IV was confirmed and monitors were applied. Sterile prep and drape, including hand hygiene and sterile gloves were used. The patient was positioned and the spine was prepped. The skin was anesthetized with lidocaine.  Free flow of clear CSF was obtained prior to injecting local anesthetic into the CSF.  The spinal needle aspirated freely following injection.  The needle was carefully withdrawn.  The patient tolerated the procedure well.

## 2019-05-04 NOTE — Anesthesia Preprocedure Evaluation (Addendum)
Anesthesia Evaluation  Patient identified by MRN, date of birth, ID band Patient awake    Reviewed: Allergy & Precautions, NPO status , Patient's Chart, lab work & pertinent test results  History of Anesthesia Complications Negative for: history of anesthetic complications  Airway Mallampati: II  TM Distance: >3 FB Neck ROM: Full    Dental  (+) Dental Advisory Given   Pulmonary pneumonia,    Pulmonary exam normal        Cardiovascular hypertension, Pt. on medications Normal cardiovascular exam     Neuro/Psych negative neurological ROS  negative psych ROS   GI/Hepatic negative GI ROS, Neg liver ROS,   Endo/Other  negative endocrine ROS  Renal/GU negative Renal ROS     Musculoskeletal  (+) Arthritis ,   Abdominal   Peds  Hematology   Anesthesia Other Findings   Reproductive/Obstetrics                            Anesthesia Physical  Anesthesia Plan  ASA: III  Anesthesia Plan: Spinal   Post-op Pain Management:  Regional for Post-op pain   Induction:   PONV Risk Score and Plan: 3 and Ondansetron, Propofol infusion and Midazolam  Airway Management Planned: Oral ETT, Simple Face Mask, Natural Airway and Nasal Cannula  Additional Equipment:   Intra-op Plan:   Post-operative Plan:   Informed Consent: I have reviewed the patients History and Physical, chart, labs and discussed the procedure including the risks, benefits and alternatives for the proposed anesthesia with the patient or authorized representative who has indicated his/her understanding and acceptance.     Dental advisory given  Plan Discussed with: Anesthesiologist and CRNA  Anesthesia Plan Comments:        Anesthesia Quick Evaluation

## 2019-05-04 NOTE — Interval H&P Note (Signed)
History and Physical Interval Note:  05/04/2019 11:35 AM  [name]  has presented today for surgery, with the diagnosis of RIGHT KNEE DEGENERATIVE JOINT DISEASE.  The various methods of treatment have been discussed with the patient and family. After consideration of risks, benefits and other options for treatment, the patient has consented to  Procedure(s): RIGHT TOTAL KNEE ARTHROPLASTY (Right) as a surgical intervention.  The patient's history has been reviewed, patient examined, no change in status, stable for surgery.  I have reviewed the patient's chart and labs.  Questions were answered to the patient's satisfaction.     Hessie Dibble

## 2019-05-04 NOTE — Anesthesia Procedure Notes (Signed)
Anesthesia Regional Block: Adductor canal block   Pre-Anesthetic Checklist: ,, timeout performed, Correct Patient, Correct Site, Correct Laterality, Correct Procedure, Correct Position, site marked, Risks and benefits discussed,  Surgical consent,  Pre-op evaluation,  At surgeon's request and post-op pain management  Laterality: Right  Prep: chloraprep       Needles:  Injection technique: Single-shot  Needle Type: Stimulator Needle - 80     Needle Length: 10cm  Needle Gauge: 21     Additional Needles:   Narrative:  Start time: 05/04/2019 11:06 AM End time: 05/04/2019 11:16 AM Injection made incrementally with aspirations every 5 mL.  Performed by: Personally

## 2019-05-04 NOTE — Anesthesia Postprocedure Evaluation (Signed)
Anesthesia Post Note  Patient: [name]  Procedure(s) Performed: RIGHT TOTAL KNEE ARTHROPLASTY (Right Knee)     Patient location during evaluation: PACU Anesthesia Type: Spinal Level of consciousness: awake and alert Pain management: pain level controlled Vital Signs Assessment: post-procedure vital signs reviewed and stable Respiratory status: spontaneous breathing and respiratory function stable Cardiovascular status: blood pressure returned to baseline and stable Postop Assessment: spinal receding Anesthetic complications: no    Last Vitals:  Vitals:   05/04/19 1645 05/04/19 1700  BP: 126/69   Pulse: 74 73  Resp: 14 18  Temp:    SpO2: 95% 96%    Last Pain:  Vitals:   05/04/19 1615  TempSrc:   PainSc: 0-No pain                 Tiajuana Amass

## 2019-05-04 NOTE — Anesthesia Procedure Notes (Signed)
Procedure Name: MAC Date/Time: 05/04/2019 12:29 PM Performed by: Niel Hummer, CRNA Pre-anesthesia Checklist: Patient identified, Emergency Drugs available, Suction available and Patient being monitored Oxygen Delivery Method: Simple face mask

## 2019-05-04 NOTE — Op Note (Signed)
PREOP DIAGNOSIS: DJD RIGHT KNEE POSTOP DIAGNOSIS: same PROCEDURE: RIGHT TKR ANESTHESIA: Spinal and MAC ATTENDING SURGEON: Hessie Dibble ASSISTANT: Loni Dolly PA  INDICATIONS FOR PROCEDURE: [name] is a 84 y.o. female who has struggled for a long time with pain due to degenerative arthritis of the right knee.  The patient has failed many conservative non-operative measures and at this point has pain which limits the ability to sleep and walk.  The patient is offered total knee replacement.  Informed operative consent was obtained after discussion of possible risks of anesthesia, infection, neurovascular injury, DVT, and death.  The importance of the post-operative rehabilitation protocol to optimize result was stressed extensively with the patient.  SUMMARY OF FINDINGS AND PROCEDURE:  [name] was taken to the operative suite where under the above anesthesia a right knee replacement was performed.  There were advanced degenerative changes and the bone quality was good.  We used the DePuy Attune system and placed size 6 narrow femur, 5 tibia, 38 mm all polyethylene patella, and a size 7 mm spacer.  We did our best to correct her valgus deformity and flexion contracture. Loni Dolly PA-C assisted throughout and was invaluable to the completion of the case in that he helped retract and maintain exposure while I placed components.  He also helped close thereby minimizing OR time.  The patient was admitted for appropriate post-op care to include perioperative antibiotics and mechanical and pharmacologic measures for DVT prophylaxis.  DESCRIPTION OF PROCEDURE:  [name] was taken to the operative suite where the above anesthesia was applied.  The patient was positioned supine and prepped and draped in normal sterile fashion.  An appropriate time out was performed.  After the administration of kefzol pre-op antibiotic the leg was elevated and exsanguinated and a tourniquet inflated.  A standard longitudinal incision was made on the anterior knee.  Dissection was carried down to the extensor mechanism.  All appropriate anti-infective measures were used including the pre-operative antibiotic, betadine impregnated drape, and closed hooded exhaust systems for each member of the surgical team.  A medial parapatellar incision was made in the extensor mechanism and the knee cap flipped and the knee flexed.  Some residual meniscal tissues were removed along with any remaining ACL/PCL tissue.  A guide was placed on the tibia and a flat cut was made on it's superior surface.  An intramedullary guide was placed in the femur and was utilized to make anterior and posterior cuts creating an appropriate flexion gap.  A second intramedullary guide was placed in the femur to make a distal cut properly balancing the knee with an extension gap equal to the flexion gap.  The three bones sized to the above mentioned sizes and the appropriate guides were placed and utilized.  A trial reduction was done and the knee easily came to full extension and the patella tracked well on flexion.  The trial components were removed and all bones were cleaned with pulsatile lavage and then dried thoroughly.  Cement was mixed and was pressurized onto the bones followed by placement of the aforementioned components.  Excess cement was trimmed and pressure was held on the components until the cement had hardened.  The tourniquet was deflated and a small amount of bleeding was controlled with cautery and pressure.  The knee was irrigated thoroughly.  The extensor mechanism was re-approximated with #1 ethibond in interrupted fashion.  The knee was flexed and the repair was solid.  The subcutaneous  tissues were re-approximated with #0 and #2-0 vicryl and the skin closed with a subcuticular stitch and steristrips.  A sterile dressing was applied.  Intraoperative fluids, EBL, and tourniquet time can be obtained from anesthesia  records.  DISPOSITION:  The patient was taken to recovery room in stable condition and admitted for appropriate post-op care to include peri-operative antibiotic and DVT prophylaxis with mechanical and pharmacologic measures.  Hessie Dibble 05/04/2019, 2:21 PM

## 2019-05-04 NOTE — Transfer of Care (Signed)
Immediate Anesthesia Transfer of Care Note  Patient: [name]  Procedure(s) Performed: RIGHT TOTAL KNEE ARTHROPLASTY (Right Knee)  Patient Location: PACU  Anesthesia Type:Spinal  Level of Consciousness: awake, alert  and oriented  Airway & Oxygen Therapy: Patient Spontanous Breathing and Patient connected to face mask oxygen  Post-op Assessment: Report given to RN and Post -op Vital signs reviewed and stable  Post vital signs: Reviewed and stable  Last Vitals:  Vitals Value Taken Time  BP    Temp    Pulse 70 05/04/19 1452  Resp 16 05/04/19 1452  SpO2 96 % 05/04/19 1452  Vitals shown include unvalidated device data.  Last Pain:  Vitals:   05/04/19 1025  TempSrc:   PainSc: 0-No pain      Patients Stated Pain Goal: 4 ([id number])  Complications: No apparent anesthesia complications

## 2019-05-04 NOTE — Evaluation (Signed)
Physical Therapy Evaluation Patient Details Name: [name] MRN: [id number] DOB: Dec 04, 1935 Today's Date: 05/04/2019   History of Present Illness  R TKA  Clinical Impression  Pt is s/p TKA resulting in the deficits listed below (see PT Problem List). Mod assist for supine to sit. Mod A sit to stand. Pt became dizzy in standing so was assisted back to bed. Vital sign stabile in supine. Initiated TKA HEP, good progress expected.  Pt will benefit from skilled PT to increase their independence and safety with mobility to allow discharge to the venue listed below.      Follow Up Recommendations Follow surgeon's recommendation for DC plan and follow-up therapies    Equipment Recommendations  Rolling walker with 5" wheels    Recommendations for Other Services       Precautions / Restrictions Precautions Precautions: Knee;Fall Restrictions Weight Bearing Restrictions: No Other Position/Activity Restrictions: WBAT      Mobility  Bed Mobility Overal bed mobility: Needs Assistance Bed Mobility: Supine to Sit     Supine to sit: Mod assist     General bed mobility comments: mod A to raise trunk  Transfers Overall transfer level: Needs assistance Equipment used: Rolling walker (2 wheeled) Transfers: Sit to/from Stand Sit to Stand: Mod assist;From elevated surface         General transfer comment: assist to rise, VCs hand placement; after weight shifting x 3 B with RW pt became dizzy in standing so returned to bed  Ambulation/Gait             General Gait Details: deferred 2* dizziness  Stairs            Wheelchair Mobility    Modified Rankin (Stroke Patients Only)       Balance Overall balance assessment: Needs assistance Sitting-balance support: Feet supported Sitting balance-Leahy Scale: Fair     Standing balance support: Bilateral upper extremity supported Standing balance-Leahy Scale: Poor                                Pertinent Vitals/Pain Pain Assessment: 0-10 Pain Score: 5  Pain Location: R Knee Pain Descriptors / Indicators: Sore;Grimacing Pain Intervention(s): Limited activity within patient's tolerance;Monitored during session;Premedicated before session;Ice applied    Home Living Family/patient expects to be discharged to:: Private residence Living Arrangements: Spouse/significant other Available Help at Discharge: Family;Available 24 hours/day   Home Access: Stairs to enter Entrance Stairs-Rails: None Entrance Stairs-Number of Steps: 1   Home Equipment: Cane - single point;Bedside commode;Grab bars - tub/shower;Grab bars - toilet      Prior Function Level of Independence: Independent with assistive device(s)         Comments: walked with cane     Hand Dominance        Extremity/Trunk Assessment   Upper Extremity Assessment Upper Extremity Assessment: Overall WFL for tasks assessed    Lower Extremity Assessment Lower Extremity Assessment: RLE deficits/detail RLE Deficits / Details: R knee AAROM 5-40*; SLR +2/5 RLE Sensation: WNL RLE Coordination: WNL    Cervical / Trunk Assessment Cervical / Trunk Assessment: Normal  Communication   Communication: No difficulties  Cognition Arousal/Alertness: Awake/alert Behavior During Therapy: WFL for tasks assessed/performed Overall Cognitive Status: Within Functional Limits for tasks assessed  General Comments      Exercises Total Joint Exercises Ankle Circles/Pumps: AROM;Both;20 reps;Supine Quad Sets: AROM;Right;5 reps;Supine Long Arc Quad: AAROM;Right;5 reps;Seated Goniometric ROM: 5-40* AAROM R knee   Assessment/Plan    PT Assessment Patient needs continued PT services  PT Problem List Decreased range of motion;Decreased activity tolerance;Decreased mobility;Pain;Decreased balance       PT Treatment Interventions DME instruction;Gait training;Functional  mobility training;Therapeutic exercise;Therapeutic activities;Stair training;Patient/family education    PT Goals (Current goals can be found in the Care Plan section)  Acute Rehab PT Goals Patient Stated Goal: to walk dog PT Goal Formulation: With patient/family Time For Goal Achievement: 05/11/19 Potential to Achieve Goals: Good    Frequency 7X/week   Barriers to discharge        Co-evaluation               AM-PAC PT "6 Clicks" Mobility  Outcome Measure Help needed turning from your back to your side while in a flat bed without using bedrails?: A Lot Help needed moving from lying on your back to sitting on the side of a flat bed without using bedrails?: A Lot Help needed moving to and from a bed to a chair (including a wheelchair)?: A Lot Help needed standing up from a chair using your arms (e.g., wheelchair or bedside chair)?: A Lot Help needed to walk in hospital room?: Total Help needed climbing 3-5 steps with a railing? : Total 6 Click Score: 10    End of Session Equipment Utilized During Treatment: Gait belt Activity Tolerance: Treatment limited secondary to medical complications (Comment)(dizzy in standing) Patient left: in chair Nurse Communication: Mobility status(dizziness in standing) PT Visit Diagnosis: Muscle weakness (generalized) (M62.81);Difficulty in walking, not elsewhere classified (R26.2);Pain Pain - Right/Left: Right Pain - part of body: Knee    Time: [id number] PT Time Calculation (min) (ACUTE ONLY): 32 min   Charges:   PT Evaluation $PT Eval Moderate Complexity: 1 Mod PT Treatments $Therapeutic Exercise: 8-22 mins       Blondell Reveal Kistler PT 05/04/2019  Acute Rehabilitation Services Pager [phone] Office [phone]

## 2019-05-04 NOTE — Plan of Care (Signed)
  Problem: Education: °Goal: Knowledge of the prescribed therapeutic regimen will improve °Outcome: Progressing °Goal: Individualized Educational Video(s) °Outcome: Progressing °  °Problem: Activity: °Goal: Ability to avoid complications of mobility impairment will improve °Outcome: Progressing °Goal: Range of joint motion will improve °Outcome: Progressing °  °Problem: Clinical Measurements: °Goal: Postoperative complications will be avoided or minimized °Outcome: Progressing °  °

## 2019-05-04 NOTE — Progress Notes (Signed)
PHARMACIST - PHYSICIAN ORDER COMMUNICATION  CONCERNING: P&T Medication Policy on Herbal Medications  DESCRIPTION:   This patient's order for: EPA/GLA  has been noted.  This product(s) is classified as an "herbal" or natural product. Due to a lack of definitive safety studies or FDA approval, nonstandard manufacturing practices, plus the potential risk of unknown drug-drug interactions while on inpatient medications, the Pharmacy and Therapeutics Committee does not permit the use of "herbal" or natural products of this type within [facility].   ACTION TAKEN: The pharmacy department is unable to verify this order at this time.  Please reevaluate patient's clinical condition at discharge and address if the herbal or natural product(s) should be resumed at that time.   []   We informed your patient of this safety policy and the herbal therapy interruption while hospitalized.  Gypsy Decant

## 2019-05-05 ENCOUNTER — Encounter: Payer: Self-pay | Admitting: *Deleted

## 2019-05-05 DIAGNOSIS — Z79899 Other long term (current) drug therapy: Secondary | ICD-10-CM | POA: Diagnosis not present

## 2019-05-05 DIAGNOSIS — M1711 Unilateral primary osteoarthritis, right knee: Secondary | ICD-10-CM | POA: Diagnosis not present

## 2019-05-05 DIAGNOSIS — M8580 Other specified disorders of bone density and structure, unspecified site: Secondary | ICD-10-CM | POA: Diagnosis not present

## 2019-05-05 DIAGNOSIS — I10 Essential (primary) hypertension: Secondary | ICD-10-CM | POA: Diagnosis not present

## 2019-05-05 DIAGNOSIS — Z7982 Long term (current) use of aspirin: Secondary | ICD-10-CM | POA: Diagnosis not present

## 2019-05-05 DIAGNOSIS — E785 Hyperlipidemia, unspecified: Secondary | ICD-10-CM | POA: Diagnosis not present

## 2019-05-05 MED ORDER — HYDROCODONE-ACETAMINOPHEN 5-325 MG PO TABS: 1.0000 | ORAL_TABLET | Freq: Four times a day (QID) | ORAL | 0 refills | Status: DC | PRN

## 2019-05-05 MED ORDER — TIZANIDINE HCL 4 MG PO TABS: 2.0000 mg | ORAL_TABLET | Freq: Four times a day (QID) | ORAL | 1 refills | Status: DC | PRN

## 2019-05-05 MED ORDER — ASPIRIN EC 81 MG PO TBEC: 81.0000 mg | DELAYED_RELEASE_TABLET | Freq: Two times a day (BID) | ORAL | 0 refills | Status: DC

## 2019-05-05 NOTE — Plan of Care (Signed)
  Problem: Education: Goal: Knowledge of the prescribed therapeutic regimen will improve Outcome: Completed/Met   Problem: Activity: Goal: Ability to avoid complications of mobility impairment will improve Outcome: Completed/Met   Problem: Activity: Goal: Range of joint motion will improve Outcome: Completed/Met   Problem: Clinical Measurements: Goal: Postoperative complications will be avoided or minimized Outcome: Completed/Met

## 2019-05-05 NOTE — Progress Notes (Signed)
Physical Therapy Treatment Patient Details Name: [name] MRN: [id number] DOB: [birth year] Today's Date: 05/05/2019    History of Present Illness R TKA    PT Comments    POD # 1 am session Assisted OOB.  General bed mobility comments: demonstarted and instructed how to use a belt to self assist LE.  General transfer comment: 25% VC's on proper hand placement and safety with turns.  General Gait Details: 25% VC's on proper walker to self distance and safety with turns. Then returned to room to perform some TE's following HEP handout.  Instructed on proper tech, freq as well as use of ICE.   Pt will need another PT session to address stairs.   Follow Up Recommendations  Follow surgeon's recommendation for DC plan and follow-up therapies;Home health PT     Equipment Recommendations  Rolling walker with 5" wheels    Recommendations for Other Services       Precautions / Restrictions Precautions Precautions: Knee;Fall Precaution Comments: instructed no pillow under knee Restrictions Weight Bearing Restrictions: No Other Position/Activity Restrictions: WBAT    Mobility  Bed Mobility Overal bed mobility: Needs Assistance Bed Mobility: Supine to Sit           General bed mobility comments: demonstarted and instructed how to use a belt to self assist LE  Transfers Overall transfer level: Needs assistance Equipment used: Rolling walker (2 wheeled) Transfers: Sit to/from Stand Sit to Stand: Supervision;Min guard         General transfer comment: 25% VC's on proper hand placement and safety with turns  Ambulation/Gait Ambulation/Gait assistance: Supervision;Min guard Gait Distance (Feet): 22 Feet Assistive device: Rolling walker (2 wheeled) Gait Pattern/deviations: Step-to pattern Gait velocity: decreased   General Gait Details: 25% VC's on proper walker to self distance and safety with turns.   Stairs             Wheelchair Mobility    Modified  Rankin (Stroke Patients Only)       Balance                                            Cognition Arousal/Alertness: Awake/alert Behavior During Therapy: WFL for tasks assessed/performed                                          Exercises   Total Knee Replacement TE's following HEP handout 10 reps B LE ankle pumps 05 reps towel squeezes 05 reps knee presses 05 reps heel slides  05 reps SAQ's 05 reps SLR's 05 reps ABD Educated on use of gait belt to assist with TE's Followed by ICE     General Comments        Pertinent Vitals/Pain Pain Assessment: 0-10 Pain Score: 5  Pain Location: R Knee Pain Descriptors / Indicators: Sore;Grimacing;Operative site guarding Pain Intervention(s): Monitored during session;Premedicated before session;Repositioned;Ice applied    Home Living                      Prior Function            PT Goals (current goals can now be found in the care plan section) Progress towards PT goals: Progressing toward goals    Frequency    7X/week  PT Plan Current plan remains appropriate    Co-evaluation              AM-PAC PT "6 Clicks" Mobility   Outcome Measure  Help needed turning from your back to your side while in a flat bed without using bedrails?: A Little Help needed moving from lying on your back to sitting on the side of a flat bed without using bedrails?: A Little Help needed moving to and from a bed to a chair (including a wheelchair)?: A Little Help needed standing up from a chair using your arms (e.g., wheelchair or bedside chair)?: A Little Help needed to walk in hospital room?: A Little Help needed climbing 3-5 steps with a railing? : A Little 6 Click Score: 18    End of Session Equipment Utilized During Treatment: Gait belt Activity Tolerance: Patient tolerated treatment well Patient left: in chair;with family/visitor present;with call bell/phone within reach Nurse  Communication: Mobility status PT Visit Diagnosis: Muscle weakness (generalized) (M62.81);Difficulty in walking, not elsewhere classified (R26.2);Pain Pain - Right/Left: Right Pain - part of body: Knee     Time: 1135-1200 PT Time Calculation (min) (ACUTE ONLY): 25 min  Charges:  $Gait Training: 8-22 mins $Therapeutic Exercise: 8-22 mins                     Rica Koyanagi  PTA Acute  Rehabilitation Services Pager      [phone] Office      [phone]

## 2019-05-05 NOTE — Progress Notes (Signed)
Subjective: 1 Day Post-Op Procedure(s) (LRB): RIGHT TOTAL KNEE ARTHROPLASTY (Right)   Patient is feeling much better this morning. She is hoping to go home today after PT.  Activity level:  wbat Diet tolerance:  ok Voiding:  ok Patient reports pain as mild.    Objective: Vital signs in last 24 hours: Temp:  [97.5 F (36.4 C)-98.6 F (37 C)] 97.5 F (36.4 C) (04/14 0533) Pulse Rate:  [59-79] 62 (04/14 0533) Resp:  [4-20] 19 (04/14 0533) BP: (108-149)/(51-93) 119/51 (04/14 0533) SpO2:  [92 %-100 %] 96 % (04/14 0533) Weight:  [80.3 kg] 80.3 kg (04/13 1025)  Labs: No results for input(s): HGB in the last 72 hours. No results for input(s): WBC, RBC, HCT, PLT in the last 72 hours. No results for input(s): NA, K, CL, CO2, BUN, CREATININE, GLUCOSE, CALCIUM in the last 72 hours. No results for input(s): LABPT, INR in the last 72 hours.  Physical Exam:  Neurologically intact ABD soft Neurovascular intact Sensation intact distally Intact pulses distally Dorsiflexion/Plantar flexion intact Incision: dressing C/D/I and no drainage No cellulitis present Compartment soft  Assessment/Plan:  1 Day Post-Op Procedure(s) (LRB): RIGHT TOTAL KNEE ARTHROPLASTY (Right) Advance diet Up with therapy D/C IV fluids Discharge home with home health after PT today if cleared and doing well. Follow up in office 2 weeks post op. Continue on 81mg  asa BID x 2 weeks post op for DVT prevention.    Larwance Sachs Calen Geister 05/05/2019, 7:46 AM

## 2019-05-05 NOTE — Progress Notes (Signed)
Physical Therapy Treatment Patient Details Name: [name] MRN: [id number] DOB: [birth year] Today's Date: 05/05/2019    History of Present Illness R TKA    PT Comments    POD # 1 pm session Assisted with amb an increased distance and practiced 2 steps.  General stair comments: with spouse "hands on" assisted 2 steps/one rail 50% VC's on proper sequencing and safe handling.  Addressed all mobility questions, discussed appropriate activity, educated on use of ICE.  Pt ready for D/C to home.   Follow Up Recommendations  Follow surgeon's recommendation for DC plan and follow-up therapies;Home health PT     Equipment Recommendations  Rolling walker with 5" wheels    Recommendations for Other Services       Precautions / Restrictions Precautions Precautions: Knee;Fall Precaution Comments: instructed no pillow under knee Restrictions Weight Bearing Restrictions: No Other Position/Activity Restrictions: WBAT    Mobility  Bed Mobility       General bed mobility comments: OOB in recliner  Transfers Overall transfer level: Needs assistance Equipment used: Rolling walker (2 wheeled) Transfers: Sit to/from Stand Sit to Stand: Supervision;Min guard         General transfer comment: 25% VC's on proper hand placement and safety with turns  Ambulation/Gait Ambulation/Gait assistance: Supervision;Min guard Gait Distance (Feet): 28 Feet Assistive device: Rolling walker (2 wheeled) Gait Pattern/deviations: Step-to pattern Gait velocity: decreased   General Gait Details: 25% VC's on proper walker to self distance and safety with turns.   Stairs Stairs: Yes Stairs assistance: Min assist Stair Management: One rail Right;Step to pattern;Forwards Number of Stairs: 2 General stair comments: with spouse "hands on" assisted 2 steps/one rail 50% VC's on proper sequencing and safe handling   Wheelchair Mobility    Modified Rankin (Stroke Patients Only)       Balance                                             Cognition Arousal/Alertness: Awake/alert Behavior During Therapy: WFL for tasks assessed/performed                                          Exercises      General Comments        Pertinent Vitals/Pain Pain Assessment: 0-10 Pain Score: 5  Pain Location: R Knee Pain Descriptors / Indicators: Sore;Grimacing;Operative site guarding Pain Intervention(s): Monitored during session;Premedicated before session;Repositioned;Ice applied    Home Living                      Prior Function            PT Goals (current goals can now be found in the care plan section) Progress towards PT goals: Progressing toward goals    Frequency    7X/week      PT Plan Current plan remains appropriate    Co-evaluation              AM-PAC PT "6 Clicks" Mobility   Outcome Measure  Help needed turning from your back to your side while in a flat bed without using bedrails?: A Little Help needed moving from lying on your back to sitting on the side of a flat bed without using bedrails?: A Little Help needed  moving to and from a bed to a chair (including a wheelchair)?: A Little Help needed standing up from a chair using your arms (e.g., wheelchair or bedside chair)?: A Little Help needed to walk in hospital room?: A Little Help needed climbing 3-5 steps with a railing? : A Little 6 Click Score: 18    End of Session Equipment Utilized During Treatment: Gait belt Activity Tolerance: Patient tolerated treatment well Patient left: in chair;with family/visitor present;with call bell/phone within reach Nurse Communication: Mobility status PT Visit Diagnosis: Muscle weakness (generalized) (M62.81);Difficulty in walking, not elsewhere classified (R26.2);Pain Pain - Right/Left: Right Pain - part of body: Knee     Time: 1415-1440 PT Time Calculation (min) (ACUTE ONLY): 25 min  Charges:  $Gait  Training: 8-22 mins $Therapeutic Activity: 8-22 mins                     Rica Koyanagi  PTA Acute  Rehabilitation Services Pager      [phone] Office      [phone]

## 2019-05-05 NOTE — Progress Notes (Signed)
Met with pt this morning and have confirmed that she is receiving a rolling walker via [place] and is already connected with [facility] for HHPT.  No further needs.  Cynthia Riga, LCSW

## 2019-05-05 NOTE — Discharge Summary (Signed)
Patient ID: [name] MRN: [id number] DOB/AGE: 1935/02/15 84 y.o.  Admit date: 05/04/2019 Discharge date: 05/05/2019  Admission Diagnoses:  Principal Problem:   Primary osteoarthritis of right knee   Discharge Diagnoses:  Same  Past Medical History:  Diagnosis Date  . Acne rosacea   . Arthritis   . Breast cancer [facility])    Has had a prior right right mastectomy and chemotherapy  . Cardiomegaly    Normal echocardiogram and cardiac workup by Dr. Johnsie Cancel  . Chest pain, unspecified   . Hyperlipidemia   . Hypertension   . Irritable bladder   . Lymphedema of arm    Right, post lumph node infection  . Osteopenia   . Pneumonia    5 years ago  . Polyp of gallbladder   . Thyroid nodule     Surgeries: Procedure(s): RIGHT TOTAL KNEE ARTHROPLASTY on 05/04/2019   Consultants:   Discharged Condition: Improved  Hospital Course: [name] is an 84 y.o. female who was admitted 05/04/2019 for operative treatment ofPrimary osteoarthritis of right knee. Patient has severe unremitting pain that affects sleep, daily activities, and work/hobbies. After pre-op clearance the patient was taken to the operating room on 05/04/2019 and underwent  Procedure(s): RIGHT TOTAL KNEE ARTHROPLASTY.    Patient was given perioperative antibiotics:  Anti-infectives (From admission, onward)   Start     Dose/Rate Route Frequency Ordered Stop   05/04/19 2000  cephALEXin (KEFLEX) capsule 250 mg     250 mg Oral Daily 05/04/19 1815     05/04/19 1830  ceFAZolin (ANCEF) IVPB 2g/100 mL premix     2 g 200 mL/hr over 30 Minutes Intravenous Every 6 hours 05/04/19 1815 05/05/19 0047   05/04/19 1015  ceFAZolin (ANCEF) IVPB 2g/100 mL premix     2 g 200 mL/hr over 30 Minutes Intravenous On call to O.R. 05/04/19 1005 05/04/19 1307       Patient was given sequential compression devices, early ambulation, and chemoprophylaxis to prevent DVT.  Patient benefited maximally from hospital stay and there were no  complications.    Recent vital signs:  Patient Vitals for the past 24 hrs:  BP Temp Temp src Pulse Resp SpO2 Height Weight  05/05/19 0533 (!) 119/51 (!) 97.5 F (36.4 C) Axillary 62 19 96 % -- --  05/05/19 0126 139/68 97.6 F (36.4 C) Oral 67 18 96 % -- --  05/04/19 2113 135/71 97.7 F (36.5 C) Oral 67 18 97 % -- --  05/04/19 2009 125/65 98.6 F (37 C) Oral 70 19 100 % -- --  05/04/19 1850 120/64 -- -- 61 20 -- -- --  05/04/19 1805 128/72 98.3 F (36.8 C) Oral 71 16 98 % -- --  05/04/19 1745 121/68 -- -- 74 15 92 % -- --  05/04/19 1730 109/66 -- -- 74 14 93 % -- --  05/04/19 1715 127/66 -- -- 79 17 94 % -- --  05/04/19 1700 134/80 -- -- 73 18 96 % -- --  05/04/19 1645 126/69 -- -- 74 14 95 % -- --  05/04/19 1630 109/77 -- -- 79 15 92 % -- --  05/04/19 1615 119/66 -- -- 64 12 94 % -- --  05/04/19 1600 119/63 -- -- 61 13 95 % -- --  05/04/19 1545 125/68 -- -- 65 11 92 % -- --  05/04/19 1530 136/72 -- -- 65 16 94 % -- --  05/04/19 1515 129/71 -- -- 64 (!) 9 100 % -- --  05/04/19 1500 115/72 -- -- 68 17 100 % -- --  05/04/19 1450 (!) 108/93 97.6 F (36.4 C) -- 69 16 100 % -- --  05/04/19 1125 -- -- -- (!) 59 (!) 9 97 % -- --  05/04/19 1124 -- -- -- 61 (!) 7 97 % -- --  05/04/19 1123 -- -- -- 64 (!) 8 97 % -- --  05/04/19 1122 -- -- -- 60 (!) 8 97 % -- --  05/04/19 1121 -- -- -- (!) 59 10 98 % -- --  05/04/19 1120 -- -- -- 61 (!) 7 98 % -- --  05/04/19 1119 -- -- -- 73 10 97 % -- --  05/04/19 1118 -- -- -- 60 (!) 8 95 % -- --  05/04/19 1117 -- -- -- 60 (!) 4 95 % -- --  05/04/19 1116 -- -- -- 62 (!) 8 98 % -- --  05/04/19 1115 128/61 -- -- (!) 59 16 95 % -- --  05/04/19 1114 -- -- -- 60 (!) 6 98 % -- --  05/04/19 1113 -- -- -- 62 13 99 % -- --  05/04/19 1112 -- -- -- 64 15 100 % -- --  05/04/19 1111 -- -- -- 68 13 99 % -- --  05/04/19 1110 (!) 149/73 -- -- 69 12 96 % -- --  05/04/19 1025 -- -- -- -- -- -- 5\' 5"  (1.651 m) 80.3 kg  05/04/19 1013 140/80 97.7 F (36.5 C) Oral  73 18 97 % 5\' 5"  (1.651 m) 80.3 kg     Recent laboratory studies: No results for input(s): WBC, HGB, HCT, PLT, NA, K, CL, CO2, BUN, CREATININE, GLUCOSE, INR, CALCIUM in the last 72 hours.  Invalid input(s): PT, 2   Discharge Medications:   Allergies as of 05/05/2019      Reactions   Sulfonamide Derivatives Anaphylaxis, Swelling   Facial/tongue/throat swelling.   Tobradex [tobramycin-dexamethasone] Itching   redness      Medication List    TAKE these medications   amLODipine 5 MG tablet Commonly known as: NORVASC Take 5 mg by mouth at bedtime.   aspirin EC 81 MG tablet Take 1 tablet (81 mg total) by mouth 2 (two) times daily after a meal. What changed: when to take this   CALTRATE 600 PLUS-VIT D PO Take 2 tablets by mouth at bedtime.   cephALEXin 250 MG capsule Commonly known as: KEFLEX Take 250 mg by mouth daily.   clobetasol ointment 0.05 % Commonly known as: TEMOVATE Apply 1 application topically 2 (two) times daily as needed (for lichen sclerosus).   EPA/GLA Caps Take 2 capsules by mouth at bedtime. HydroEye Softgels Dry Eye Relief   Fish Oil 1200 MG Cpdr Take 1,200 mg by mouth at bedtime.   HYDROcodone-acetaminophen 5-325 MG tablet Commonly known as: NORCO/VICODIN Take 1-2 tablets by mouth every 6 (six) hours as needed for moderate pain or severe pain (post op pain).   losartan 100 MG tablet Commonly known as: COZAAR Take 100 mg by mouth at bedtime.   Prolia 60 MG/ML Soln injection Generic drug: denosumab Inject 60 mg into the skin every 6 (six) months.   rosuvastatin 10 MG tablet Commonly known as: CRESTOR Take 10 mg by mouth at bedtime.   solifenacin 5 MG tablet Commonly known as: VESICARE Take 5 mg by mouth daily.   tiZANidine 4 MG tablet Commonly known as: Zanaflex Take 0.5-1 tablets (2-4 mg total) by mouth every 6 (six) hours as needed  for muscle spasms.            Durable Medical Equipment  (From admission, onward)         Start      Ordered   05/04/19 1816  DME Walker rolling  Once    Question:  Patient needs a walker to treat with the following condition  Answer:  Primary osteoarthritis of right knee   05/04/19 1815   05/04/19 1816  DME 3 n 1  Once     05/04/19 1815   05/04/19 1816  DME Bedside commode  Once    Question:  Patient needs a bedside commode to treat with the following condition  Answer:  Primary osteoarthritis of right knee   05/04/19 1815          Diagnostic Studies: DG Chest 2 View  Result Date: 04/22/2019 CLINICAL DATA:  Preop knee surgery history of right breast cancer EXAM: CHEST - 2 VIEW COMPARISON:  11/30/2018 FINDINGS: Postsurgical changes of the right chest and breast. No focal opacity or pleural effusion. Stable cardiomediastinal silhouette with aortic atherosclerosis. No pneumothorax. IMPRESSION: No active cardiopulmonary disease. Electronically Signed   By: Donavan Foil M.D.   On: 04/22/2019 20:19    Disposition: Discharge disposition: 01-Home or Self Care       Discharge Instructions    Call MD / Call 911   Complete by: As directed    If you experience chest pain or shortness of breath, CALL 911 and be transported to the hospital emergency room.  If you develope a fever above 101 F, pus (white drainage) or increased drainage or redness at the wound, or calf pain, call your surgeon's office.   Constipation Prevention   Complete by: As directed    Drink plenty of fluids.  Prune juice may be helpful.  You may use a stool softener, such as Colace (over the counter) 100 mg twice a day.  Use MiraLax (over the counter) for constipation as needed.   Diet - low sodium heart healthy   Complete by: As directed    Discharge instructions   Complete by: As directed    INSTRUCTIONS AFTER JOINT REPLACEMENT   Remove items at home which could result in a fall. This includes throw rugs or furniture in walking pathways ICE to the affected joint every three hours while awake for 30 minutes at a  time, for at least the first 3-5 days, and then as needed for pain and swelling.  Continue to use ice for pain and swelling. You may notice swelling that will progress down to the foot and ankle.  This is normal after surgery.  Elevate your leg when you are not up walking on it.   Continue to use the breathing machine you got in the hospital (incentive spirometer) which will help keep your temperature down.  It is common for your temperature to cycle up and down following surgery, especially at night when you are not up moving around and exerting yourself.  The breathing machine keeps your lungs expanded and your temperature down.   DIET:  As you were doing prior to hospitalization, we recommend a well-balanced diet.  DRESSING / WOUND CARE / SHOWERING  You may shower 3 days after surgery, but keep the wounds dry during showering.  You may use an occlusive plastic wrap (Press'n Seal for example), NO SOAKING/SUBMERGING IN THE BATHTUB.  If the bandage gets wet, change with a clean dry gauze.  If the incision gets wet, pat  the wound dry with a clean towel.  ACTIVITY  Increase activity slowly as tolerated, but follow the weight bearing instructions below.   No driving for 6 weeks or until further direction given by your physician.  You cannot drive while taking narcotics.  No lifting or carrying greater than 10 lbs. until further directed by your surgeon. Avoid periods of inactivity such as sitting longer than an hour when not asleep. This helps prevent blood clots.  You may return to work once you are authorized by your doctor.     WEIGHT BEARING   Weight bearing as tolerated with assist device (walker, cane, etc) as directed, use it as long as suggested by your surgeon or therapist, typically at least 4-6 weeks.   EXERCISES  Results after joint replacement surgery are often greatly improved when you follow the exercise, range of motion and muscle strengthening exercises prescribed by your  doctor. Safety measures are also important to protect the joint from further injury. Any time any of these exercises cause you to have increased pain or swelling, decrease what you are doing until you are comfortable again and then slowly increase them. If you have problems or questions, call your caregiver or physical therapist for advice.   Rehabilitation is important following a joint replacement. After just a few days of immobilization, the muscles of the leg can become weakened and shrink (atrophy).  These exercises are designed to build up the tone and strength of the thigh and leg muscles and to improve motion. Often times heat used for twenty to thirty minutes before working out will loosen up your tissues and help with improving the range of motion but do not use heat for the first two weeks following surgery (sometimes heat can increase post-operative swelling).   These exercises can be done on a training (exercise) mat, on the floor, on a table or on a bed. Use whatever works the best and is most comfortable for you.    Use music or television while you are exercising so that the exercises are a pleasant break in your day. This will make your life better with the exercises acting as a break in your routine that you can look forward to.   Perform all exercises about fifteen times, three times per day or as directed.  You should exercise both the operative leg and the other leg as well.   Exercises include:   Quad Sets - Tighten up the muscle on the front of the thigh (Quad) and hold for 5-10 seconds.   Straight Leg Raises - With your knee straight (if you were given a brace, keep it on), lift the leg to 60 degrees, hold for 3 seconds, and slowly lower the leg.  Perform this exercise against resistance later as your leg gets stronger.  Leg Slides: Lying on your back, slowly slide your foot toward your buttocks, bending your knee up off the floor (only go as far as is comfortable). Then slowly  slide your foot back down until your leg is flat on the floor again.  Angel Wings: Lying on your back spread your legs to the side as far apart as you can without causing discomfort.  Hamstring Strength:  Lying on your back, push your heel against the floor with your leg straight by tightening up the muscles of your buttocks.  Repeat, but this time bend your knee to a comfortable angle, and push your heel against the floor.  You may put a pillow under  the heel to make it more comfortable if necessary.   A rehabilitation program following joint replacement surgery can speed recovery and prevent re-injury in the future due to weakened muscles. Contact your doctor or a physical therapist for more information on knee rehabilitation.    CONSTIPATION  Constipation is defined medically as fewer than three stools per week and severe constipation as less than one stool per week.  Even if you have a regular bowel pattern at home, your normal regimen is likely to be disrupted due to multiple reasons following surgery.  Combination of anesthesia, postoperative narcotics, change in appetite and fluid intake all can affect your bowels.   YOU MUST use at least one of the following options; they are listed in order of increasing strength to get the job done.  They are all available over the counter, and you may need to use some, POSSIBLY even all of these options:    Drink plenty of fluids (prune juice may be helpful) and high fiber foods Colace 100 mg by mouth twice a day  Senokot for constipation as directed and as needed Dulcolax (bisacodyl), take with full glass of water  Miralax (polyethylene glycol) once or twice a day as needed.  If you have tried all these things and are unable to have a bowel movement in the first 3-4 days after surgery call either your surgeon or your primary doctor.    If you experience loose stools or diarrhea, hold the medications until you stool forms back up.  If your symptoms do  not get better within 1 week or if they get worse, check with your doctor.  If you experience "the worst abdominal pain ever" or develop nausea or vomiting, please contact the office immediately for further recommendations for treatment.   ITCHING:  If you experience itching with your medications, try taking only a single pain pill, or even half a pain pill at a time.  You can also use Benadryl over the counter for itching or also to help with sleep.   TED HOSE STOCKINGS:  Use stockings on both legs until for at least 2 weeks or as directed by physician office. They may be removed at night for sleeping.  MEDICATIONS:  See your medication summary on the "After Visit Summary" that nursing will review with you.  You may have some home medications which will be placed on hold until you complete the course of blood thinner medication.  It is important for you to complete the blood thinner medication as prescribed.  PRECAUTIONS:  If you experience chest pain or shortness of breath - call 911 immediately for transfer to the hospital emergency department.   If you develop a fever greater that 101 F, purulent drainage from wound, increased redness or drainage from wound, foul odor from the wound/dressing, or calf pain - CONTACT YOUR SURGEON.                                                   FOLLOW-UP APPOINTMENTS:  If you do not already have a post-op appointment, please call the office for an appointment to be seen by your surgeon.  Guidelines for how soon to be seen are listed in your "After Visit Summary", but are typically between 1-4 weeks after surgery.  OTHER INSTRUCTIONS:   Knee Replacement:  Do not place pillow  under knee, focus on keeping the knee straight while resting. CPM instructions: 0-90 degrees, 2 hours in the morning, 2 hours in the afternoon, and 2 hours in the evening. Place foam block, curve side up under heel at all times except when in CPM or when walking.  DO NOT modify, tear, cut, or  change the foam block in any way.   DENTAL ANTIBIOTICS:  In most cases prophylactic antibiotics for Dental procdeures after total joint surgery are not necessary.  Exceptions are as follows:  1. History of prior total joint infection  2. Severely immunocompromised (Organ Transplant, cancer chemotherapy, Rheumatoid biologic meds such as Prospect)  3. Poorly controlled diabetes (A1C &gt; 8.0, blood glucose over 200)  If you have one of these conditions, contact your surgeon for an antibiotic prescription, prior to your dental procedure.   MAKE SURE YOU:  Understand these instructions.  Get help right away if you are not doing well or get worse.    Thank you for letting us be a part of your medical care team.  It is a privilege we respect greatly.  We hope these instructions will help you stay on track for a fast and full recovery!   Increase activity slowly as tolerated   Complete by: As directed       Follow-up Information    Melrose Nakayama, MD. Go on 05/14/2019.   Specialty: Orthopedic Surgery Why: Your appointment is scheduled for 9:30. Contact information: [place] [address] [phone]        [facility] up.   Specialty: [place] Why: HHPT will see you at home for 5 PT visits prior to starting outpatient physical therapy  Contact information: [address] [place] [address] [phone]        [facility], [place]. Go on 05/14/2019.   Why: You are scheduled to start outpatient physical therapy at 10:40. Please go over after your MD appointment to complete your paperwork.  Contact information: Physical Therapy [address] [place] [address] [phone]            Signed: Larwance Sachs Quinisha Mould 05/05/2019, 7:50 AM

## 2019-05-05 NOTE — Plan of Care (Signed)
  Problem: Education: Goal: Knowledge of the prescribed therapeutic regimen will improve Outcome: Progressing   Problem: Pain Management: Goal: Pain level will decrease with appropriate interventions Outcome: Progressing   Problem: Education: Goal: Knowledge of General Education information will improve Description: Including pain rating scale, medication(s)/side effects and non-pharmacologic comfort measures Outcome: Progressing   Problem: Coping: Goal: Level of anxiety will decrease Outcome: Progressing   Problem: Pain Managment: Goal: General experience of comfort will improve Outcome: Progressing   

## 2019-05-06 DIAGNOSIS — Z471 Aftercare following joint replacement surgery: Secondary | ICD-10-CM | POA: Diagnosis not present

## 2019-05-06 DIAGNOSIS — M8580 Other specified disorders of bone density and structure, unspecified site: Secondary | ICD-10-CM | POA: Diagnosis not present

## 2019-05-06 DIAGNOSIS — K828 Other specified diseases of gallbladder: Secondary | ICD-10-CM | POA: Diagnosis not present

## 2019-05-06 DIAGNOSIS — L719 Rosacea, unspecified: Secondary | ICD-10-CM | POA: Diagnosis not present

## 2019-05-06 DIAGNOSIS — Z87440 Personal history of urinary (tract) infections: Secondary | ICD-10-CM | POA: Diagnosis not present

## 2019-05-06 DIAGNOSIS — I119 Hypertensive heart disease without heart failure: Secondary | ICD-10-CM | POA: Diagnosis not present

## 2019-05-06 DIAGNOSIS — I890 Lymphedema, not elsewhere classified: Secondary | ICD-10-CM | POA: Diagnosis not present

## 2019-05-06 DIAGNOSIS — Z9011 Acquired absence of right breast and nipple: Secondary | ICD-10-CM | POA: Diagnosis not present

## 2019-05-06 DIAGNOSIS — Z96651 Presence of right artificial knee joint: Secondary | ICD-10-CM | POA: Diagnosis not present

## 2019-05-06 DIAGNOSIS — N3289 Other specified disorders of bladder: Secondary | ICD-10-CM | POA: Diagnosis not present

## 2019-05-06 DIAGNOSIS — E785 Hyperlipidemia, unspecified: Secondary | ICD-10-CM | POA: Diagnosis not present

## 2019-05-06 DIAGNOSIS — I440 Atrioventricular block, first degree: Secondary | ICD-10-CM | POA: Diagnosis not present

## 2019-05-06 DIAGNOSIS — Z7982 Long term (current) use of aspirin: Secondary | ICD-10-CM | POA: Diagnosis not present

## 2019-05-06 DIAGNOSIS — Z853 Personal history of malignant neoplasm of breast: Secondary | ICD-10-CM | POA: Diagnosis not present

## 2019-05-06 DIAGNOSIS — Z8701 Personal history of pneumonia (recurrent): Secondary | ICD-10-CM | POA: Diagnosis not present

## 2019-05-06 DIAGNOSIS — E041 Nontoxic single thyroid nodule: Secondary | ICD-10-CM | POA: Diagnosis not present

## 2019-05-06 DIAGNOSIS — M1712 Unilateral primary osteoarthritis, left knee: Secondary | ICD-10-CM | POA: Diagnosis not present

## 2019-05-07 DIAGNOSIS — I440 Atrioventricular block, first degree: Secondary | ICD-10-CM | POA: Diagnosis not present

## 2019-05-07 DIAGNOSIS — Z471 Aftercare following joint replacement surgery: Secondary | ICD-10-CM | POA: Diagnosis not present

## 2019-05-07 DIAGNOSIS — E785 Hyperlipidemia, unspecified: Secondary | ICD-10-CM | POA: Diagnosis not present

## 2019-05-07 DIAGNOSIS — M1712 Unilateral primary osteoarthritis, left knee: Secondary | ICD-10-CM | POA: Diagnosis not present

## 2019-05-07 DIAGNOSIS — I119 Hypertensive heart disease without heart failure: Secondary | ICD-10-CM | POA: Diagnosis not present

## 2019-05-07 DIAGNOSIS — E041 Nontoxic single thyroid nodule: Secondary | ICD-10-CM | POA: Diagnosis not present

## 2019-05-10 DIAGNOSIS — I440 Atrioventricular block, first degree: Secondary | ICD-10-CM | POA: Diagnosis not present

## 2019-05-10 DIAGNOSIS — Z471 Aftercare following joint replacement surgery: Secondary | ICD-10-CM | POA: Diagnosis not present

## 2019-05-10 DIAGNOSIS — E785 Hyperlipidemia, unspecified: Secondary | ICD-10-CM | POA: Diagnosis not present

## 2019-05-10 DIAGNOSIS — I119 Hypertensive heart disease without heart failure: Secondary | ICD-10-CM | POA: Diagnosis not present

## 2019-05-10 DIAGNOSIS — E041 Nontoxic single thyroid nodule: Secondary | ICD-10-CM | POA: Diagnosis not present

## 2019-05-10 DIAGNOSIS — M1712 Unilateral primary osteoarthritis, left knee: Secondary | ICD-10-CM | POA: Diagnosis not present

## 2019-05-11 DIAGNOSIS — Z471 Aftercare following joint replacement surgery: Secondary | ICD-10-CM | POA: Diagnosis not present

## 2019-05-11 DIAGNOSIS — I440 Atrioventricular block, first degree: Secondary | ICD-10-CM | POA: Diagnosis not present

## 2019-05-11 DIAGNOSIS — E041 Nontoxic single thyroid nodule: Secondary | ICD-10-CM | POA: Diagnosis not present

## 2019-05-11 DIAGNOSIS — I119 Hypertensive heart disease without heart failure: Secondary | ICD-10-CM | POA: Diagnosis not present

## 2019-05-11 DIAGNOSIS — M1712 Unilateral primary osteoarthritis, left knee: Secondary | ICD-10-CM | POA: Diagnosis not present

## 2019-05-11 DIAGNOSIS — E785 Hyperlipidemia, unspecified: Secondary | ICD-10-CM | POA: Diagnosis not present

## 2019-05-13 DIAGNOSIS — Z471 Aftercare following joint replacement surgery: Secondary | ICD-10-CM | POA: Diagnosis not present

## 2019-05-13 DIAGNOSIS — I440 Atrioventricular block, first degree: Secondary | ICD-10-CM | POA: Diagnosis not present

## 2019-05-13 DIAGNOSIS — E785 Hyperlipidemia, unspecified: Secondary | ICD-10-CM | POA: Diagnosis not present

## 2019-05-13 DIAGNOSIS — I119 Hypertensive heart disease without heart failure: Secondary | ICD-10-CM | POA: Diagnosis not present

## 2019-05-13 DIAGNOSIS — M1712 Unilateral primary osteoarthritis, left knee: Secondary | ICD-10-CM | POA: Diagnosis not present

## 2019-05-13 DIAGNOSIS — E041 Nontoxic single thyroid nodule: Secondary | ICD-10-CM | POA: Diagnosis not present

## 2019-05-14 DIAGNOSIS — M1711 Unilateral primary osteoarthritis, right knee: Secondary | ICD-10-CM | POA: Diagnosis not present

## 2019-05-14 DIAGNOSIS — M25461 Effusion, right knee: Secondary | ICD-10-CM | POA: Diagnosis not present

## 2019-05-14 DIAGNOSIS — Z96651 Presence of right artificial knee joint: Secondary | ICD-10-CM | POA: Diagnosis not present

## 2019-05-14 DIAGNOSIS — M25661 Stiffness of right knee, not elsewhere classified: Secondary | ICD-10-CM | POA: Diagnosis not present

## 2019-05-14 DIAGNOSIS — Z471 Aftercare following joint replacement surgery: Secondary | ICD-10-CM | POA: Diagnosis not present

## 2019-05-18 DIAGNOSIS — M25661 Stiffness of right knee, not elsewhere classified: Secondary | ICD-10-CM | POA: Diagnosis not present

## 2019-05-18 DIAGNOSIS — Z96651 Presence of right artificial knee joint: Secondary | ICD-10-CM | POA: Diagnosis not present

## 2019-05-18 DIAGNOSIS — M25461 Effusion, right knee: Secondary | ICD-10-CM | POA: Diagnosis not present

## 2019-05-18 DIAGNOSIS — Z471 Aftercare following joint replacement surgery: Secondary | ICD-10-CM | POA: Diagnosis not present

## 2019-05-20 DIAGNOSIS — Z96651 Presence of right artificial knee joint: Secondary | ICD-10-CM | POA: Diagnosis not present

## 2019-05-20 DIAGNOSIS — M25461 Effusion, right knee: Secondary | ICD-10-CM | POA: Diagnosis not present

## 2019-05-20 DIAGNOSIS — Z471 Aftercare following joint replacement surgery: Secondary | ICD-10-CM | POA: Diagnosis not present

## 2019-05-25 DIAGNOSIS — Z96651 Presence of right artificial knee joint: Secondary | ICD-10-CM | POA: Diagnosis not present

## 2019-05-25 DIAGNOSIS — Z471 Aftercare following joint replacement surgery: Secondary | ICD-10-CM | POA: Diagnosis not present

## 2019-05-25 DIAGNOSIS — M25461 Effusion, right knee: Secondary | ICD-10-CM | POA: Diagnosis not present

## 2019-05-25 DIAGNOSIS — M25661 Stiffness of right knee, not elsewhere classified: Secondary | ICD-10-CM | POA: Diagnosis not present

## 2019-05-28 DIAGNOSIS — M25661 Stiffness of right knee, not elsewhere classified: Secondary | ICD-10-CM | POA: Diagnosis not present

## 2019-05-28 DIAGNOSIS — Z471 Aftercare following joint replacement surgery: Secondary | ICD-10-CM | POA: Diagnosis not present

## 2019-05-28 DIAGNOSIS — M25461 Effusion, right knee: Secondary | ICD-10-CM | POA: Diagnosis not present

## 2019-05-28 DIAGNOSIS — Z96651 Presence of right artificial knee joint: Secondary | ICD-10-CM | POA: Diagnosis not present

## 2019-06-01 DIAGNOSIS — M25461 Effusion, right knee: Secondary | ICD-10-CM | POA: Diagnosis not present

## 2019-06-01 DIAGNOSIS — Z96651 Presence of right artificial knee joint: Secondary | ICD-10-CM | POA: Diagnosis not present

## 2019-06-01 DIAGNOSIS — Z471 Aftercare following joint replacement surgery: Secondary | ICD-10-CM | POA: Diagnosis not present

## 2019-06-01 DIAGNOSIS — M25661 Stiffness of right knee, not elsewhere classified: Secondary | ICD-10-CM | POA: Diagnosis not present

## 2019-06-03 DIAGNOSIS — M25661 Stiffness of right knee, not elsewhere classified: Secondary | ICD-10-CM | POA: Diagnosis not present

## 2019-06-03 DIAGNOSIS — Z96651 Presence of right artificial knee joint: Secondary | ICD-10-CM | POA: Diagnosis not present

## 2019-06-03 DIAGNOSIS — Z471 Aftercare following joint replacement surgery: Secondary | ICD-10-CM | POA: Diagnosis not present

## 2019-06-03 DIAGNOSIS — M25461 Effusion, right knee: Secondary | ICD-10-CM | POA: Diagnosis not present

## 2019-06-05 DIAGNOSIS — Z471 Aftercare following joint replacement surgery: Secondary | ICD-10-CM | POA: Diagnosis not present

## 2019-06-08 DIAGNOSIS — M25661 Stiffness of right knee, not elsewhere classified: Secondary | ICD-10-CM | POA: Diagnosis not present

## 2019-06-08 DIAGNOSIS — M25641 Stiffness of right hand, not elsewhere classified: Secondary | ICD-10-CM | POA: Diagnosis not present

## 2019-06-08 DIAGNOSIS — Z96651 Presence of right artificial knee joint: Secondary | ICD-10-CM | POA: Diagnosis not present

## 2019-06-08 DIAGNOSIS — Z471 Aftercare following joint replacement surgery: Secondary | ICD-10-CM | POA: Diagnosis not present

## 2019-06-10 DIAGNOSIS — M25461 Effusion, right knee: Secondary | ICD-10-CM | POA: Diagnosis not present

## 2019-06-10 DIAGNOSIS — Z471 Aftercare following joint replacement surgery: Secondary | ICD-10-CM | POA: Diagnosis not present

## 2019-06-10 DIAGNOSIS — Z96651 Presence of right artificial knee joint: Secondary | ICD-10-CM | POA: Diagnosis not present

## 2019-06-10 DIAGNOSIS — M25661 Stiffness of right knee, not elsewhere classified: Secondary | ICD-10-CM | POA: Diagnosis not present

## 2019-06-15 DIAGNOSIS — Z96651 Presence of right artificial knee joint: Secondary | ICD-10-CM | POA: Diagnosis not present

## 2019-06-15 DIAGNOSIS — Z471 Aftercare following joint replacement surgery: Secondary | ICD-10-CM | POA: Diagnosis not present

## 2019-06-15 DIAGNOSIS — M25461 Effusion, right knee: Secondary | ICD-10-CM | POA: Diagnosis not present

## 2019-06-15 DIAGNOSIS — M25661 Stiffness of right knee, not elsewhere classified: Secondary | ICD-10-CM | POA: Diagnosis not present

## 2019-06-17 DIAGNOSIS — Z471 Aftercare following joint replacement surgery: Secondary | ICD-10-CM | POA: Diagnosis not present

## 2019-06-17 DIAGNOSIS — M25661 Stiffness of right knee, not elsewhere classified: Secondary | ICD-10-CM | POA: Diagnosis not present

## 2019-06-17 DIAGNOSIS — Z96651 Presence of right artificial knee joint: Secondary | ICD-10-CM | POA: Diagnosis not present

## 2019-06-17 DIAGNOSIS — M25461 Effusion, right knee: Secondary | ICD-10-CM | POA: Diagnosis not present

## 2019-06-17 DIAGNOSIS — M810 Age-related osteoporosis without current pathological fracture: Secondary | ICD-10-CM | POA: Diagnosis not present

## 2019-06-22 DIAGNOSIS — Z471 Aftercare following joint replacement surgery: Secondary | ICD-10-CM | POA: Diagnosis not present

## 2019-06-22 DIAGNOSIS — M25461 Effusion, right knee: Secondary | ICD-10-CM | POA: Diagnosis not present

## 2019-06-22 DIAGNOSIS — M25661 Stiffness of right knee, not elsewhere classified: Secondary | ICD-10-CM | POA: Diagnosis not present

## 2019-06-22 DIAGNOSIS — Z96651 Presence of right artificial knee joint: Secondary | ICD-10-CM | POA: Diagnosis not present

## 2019-06-23 DIAGNOSIS — L900 Lichen sclerosus et atrophicus: Secondary | ICD-10-CM | POA: Diagnosis not present

## 2019-06-24 DIAGNOSIS — M25661 Stiffness of right knee, not elsewhere classified: Secondary | ICD-10-CM | POA: Diagnosis not present

## 2019-06-24 DIAGNOSIS — Z96651 Presence of right artificial knee joint: Secondary | ICD-10-CM | POA: Diagnosis not present

## 2019-06-24 DIAGNOSIS — Z471 Aftercare following joint replacement surgery: Secondary | ICD-10-CM | POA: Diagnosis not present

## 2019-06-24 DIAGNOSIS — M25461 Effusion, right knee: Secondary | ICD-10-CM | POA: Diagnosis not present

## 2019-06-29 DIAGNOSIS — M25661 Stiffness of right knee, not elsewhere classified: Secondary | ICD-10-CM | POA: Diagnosis not present

## 2019-06-29 DIAGNOSIS — Z471 Aftercare following joint replacement surgery: Secondary | ICD-10-CM | POA: Diagnosis not present

## 2019-06-29 DIAGNOSIS — M25461 Effusion, right knee: Secondary | ICD-10-CM | POA: Diagnosis not present

## 2019-06-29 DIAGNOSIS — Z96651 Presence of right artificial knee joint: Secondary | ICD-10-CM | POA: Diagnosis not present

## 2019-07-01 DIAGNOSIS — Z471 Aftercare following joint replacement surgery: Secondary | ICD-10-CM | POA: Diagnosis not present

## 2019-07-01 DIAGNOSIS — M25661 Stiffness of right knee, not elsewhere classified: Secondary | ICD-10-CM | POA: Diagnosis not present

## 2019-07-01 DIAGNOSIS — Z96651 Presence of right artificial knee joint: Secondary | ICD-10-CM | POA: Diagnosis not present

## 2019-07-01 DIAGNOSIS — M25461 Effusion, right knee: Secondary | ICD-10-CM | POA: Diagnosis not present

## 2019-07-06 DIAGNOSIS — Z96651 Presence of right artificial knee joint: Secondary | ICD-10-CM | POA: Diagnosis not present

## 2019-07-06 DIAGNOSIS — M25461 Effusion, right knee: Secondary | ICD-10-CM | POA: Diagnosis not present

## 2019-07-06 DIAGNOSIS — Z471 Aftercare following joint replacement surgery: Secondary | ICD-10-CM | POA: Diagnosis not present

## 2019-07-06 DIAGNOSIS — M25661 Stiffness of right knee, not elsewhere classified: Secondary | ICD-10-CM | POA: Diagnosis not present

## 2019-07-08 DIAGNOSIS — M25661 Stiffness of right knee, not elsewhere classified: Secondary | ICD-10-CM | POA: Diagnosis not present

## 2019-07-08 DIAGNOSIS — Z96651 Presence of right artificial knee joint: Secondary | ICD-10-CM | POA: Diagnosis not present

## 2019-07-08 DIAGNOSIS — M25461 Effusion, right knee: Secondary | ICD-10-CM | POA: Diagnosis not present

## 2019-07-08 DIAGNOSIS — Z471 Aftercare following joint replacement surgery: Secondary | ICD-10-CM | POA: Diagnosis not present

## 2019-08-18 DIAGNOSIS — N390 Urinary tract infection, site not specified: Secondary | ICD-10-CM | POA: Diagnosis not present

## 2019-08-18 DIAGNOSIS — N3946 Mixed incontinence: Secondary | ICD-10-CM | POA: Diagnosis not present

## 2019-08-31 DIAGNOSIS — H903 Sensorineural hearing loss, bilateral: Secondary | ICD-10-CM | POA: Diagnosis not present

## 2019-09-30 DIAGNOSIS — N3946 Mixed incontinence: Secondary | ICD-10-CM | POA: Diagnosis not present

## 2019-09-30 DIAGNOSIS — R350 Frequency of micturition: Secondary | ICD-10-CM | POA: Diagnosis not present

## 2019-10-15 DIAGNOSIS — R350 Frequency of micturition: Secondary | ICD-10-CM | POA: Diagnosis not present

## 2019-10-22 DIAGNOSIS — R350 Frequency of micturition: Secondary | ICD-10-CM | POA: Diagnosis not present

## 2019-10-29 DIAGNOSIS — Z23 Encounter for immunization: Secondary | ICD-10-CM | POA: Diagnosis not present

## 2019-10-29 DIAGNOSIS — R350 Frequency of micturition: Secondary | ICD-10-CM | POA: Diagnosis not present

## 2019-11-05 DIAGNOSIS — R350 Frequency of micturition: Secondary | ICD-10-CM | POA: Diagnosis not present

## 2019-11-12 DIAGNOSIS — R350 Frequency of micturition: Secondary | ICD-10-CM | POA: Diagnosis not present

## 2019-11-16 DIAGNOSIS — H52223 Regular astigmatism, bilateral: Secondary | ICD-10-CM | POA: Diagnosis not present

## 2019-11-16 DIAGNOSIS — H353131 Nonexudative age-related macular degeneration, bilateral, early dry stage: Secondary | ICD-10-CM | POA: Diagnosis not present

## 2019-11-16 DIAGNOSIS — H43813 Vitreous degeneration, bilateral: Secondary | ICD-10-CM | POA: Diagnosis not present

## 2019-11-16 DIAGNOSIS — H35033 Hypertensive retinopathy, bilateral: Secondary | ICD-10-CM | POA: Diagnosis not present

## 2019-11-16 DIAGNOSIS — H04123 Dry eye syndrome of bilateral lacrimal glands: Secondary | ICD-10-CM | POA: Diagnosis not present

## 2019-11-16 DIAGNOSIS — H524 Presbyopia: Secondary | ICD-10-CM | POA: Diagnosis not present

## 2019-11-19 DIAGNOSIS — N3946 Mixed incontinence: Secondary | ICD-10-CM | POA: Diagnosis not present

## 2019-11-20 ENCOUNTER — Ambulatory Visit: Payer: Medicare Other

## 2019-11-26 DIAGNOSIS — R350 Frequency of micturition: Secondary | ICD-10-CM | POA: Diagnosis not present

## 2019-11-26 DIAGNOSIS — R3915 Urgency of urination: Secondary | ICD-10-CM | POA: Diagnosis not present

## 2019-12-01 DIAGNOSIS — Z0000 Encounter for general adult medical examination without abnormal findings: Secondary | ICD-10-CM | POA: Diagnosis not present

## 2019-12-01 DIAGNOSIS — M810 Age-related osteoporosis without current pathological fracture: Secondary | ICD-10-CM | POA: Diagnosis not present

## 2019-12-01 DIAGNOSIS — E7800 Pure hypercholesterolemia, unspecified: Secondary | ICD-10-CM | POA: Diagnosis not present

## 2019-12-01 DIAGNOSIS — I10 Essential (primary) hypertension: Secondary | ICD-10-CM | POA: Diagnosis not present

## 2019-12-01 DIAGNOSIS — E559 Vitamin D deficiency, unspecified: Secondary | ICD-10-CM | POA: Diagnosis not present

## 2019-12-03 DIAGNOSIS — R350 Frequency of micturition: Secondary | ICD-10-CM | POA: Diagnosis not present

## 2019-12-06 DIAGNOSIS — M810 Age-related osteoporosis without current pathological fracture: Secondary | ICD-10-CM | POA: Diagnosis not present

## 2019-12-06 DIAGNOSIS — Z0000 Encounter for general adult medical examination without abnormal findings: Secondary | ICD-10-CM | POA: Diagnosis not present

## 2019-12-06 DIAGNOSIS — E7800 Pure hypercholesterolemia, unspecified: Secondary | ICD-10-CM | POA: Diagnosis not present

## 2019-12-06 DIAGNOSIS — I10 Essential (primary) hypertension: Secondary | ICD-10-CM | POA: Diagnosis not present

## 2019-12-06 NOTE — Progress Notes (Signed)
Cardiology Office Note   Date:  12/13/2019   ID:  [name], DOB [birth year], MRN [id number]  PCP:  Deland Pretty, MD  Cardiologist:  Dr. Johnsie Cancel     History of Present Illness:  84 y.o. f/u atypical chest pain. First seen 2011 pain in setting of pneumonia. TTE with LVH EF 60% Myovue normal no ischemia.  Calcium score 10/23/15  Was 19 50th percentile for age and sex. Benign RM/lingular nodules thought benign stable since 2014 On statin Labs followed by Dr Shelia Media. Two drug Rx for HTN  08/10/14 Left knee arthroscopy Dr Maureen Ralphs for medial meniscus tear Now seeing Daldorf Had right TKR 05/04/19   05/08/17 laparoscopic cholecystectomy Dr Lucia Gaskins   No cardiac complaints   Discussed utility of calcium score to further guide cholesterol RX Getting along real well with her new knee .     Past Medical History:  Diagnosis Date  . Acne rosacea   . Arthritis   . Breast cancer [facility])    Has had a prior right right mastectomy and chemotherapy  . Cardiomegaly    Normal echocardiogram and cardiac workup by Dr. Johnsie Cancel  . Chest pain, unspecified   . Hyperlipidemia   . Hypertension   . Irritable bladder   . Lymphedema of arm    Right, post lumph node infection  . Osteopenia   . Pneumonia    5 years ago  . Polyp of gallbladder   . Thyroid nodule     Past Surgical History:  Procedure Laterality Date  . ABDOMINAL HYSTERECTOMY    . CHOLECYSTECTOMY N/A 05/08/2017   Procedure: LAPAROSCOPIC CHOLECYSTECTOMY WITH INTRAOPERATIVE CHOLANGIOGRAM;  Surgeon: Alphonsa Overall, MD;  Location: [facility];  Service: General;  Laterality: N/A;  ERAS PATHWAY  . EYE SURGERY     cataract surgery bilateral  . KNEE ARTHROSCOPY Left 08/10/2014   Procedure: LEFT KNEE ARTHROSCOPY WITH MENSCIAL DEBRIDEMENT CONDROPLASTY;  Surgeon: Gaynelle Arabian, MD;  Location: [facility];  Service: Orthopedics;  Laterality: Left;  Marland Kitchen MASTECTOMY     right breast  . RECONSTRUCTION BREAST W/ TRAM FLAP  1997   after right mastectomy  .  TONSILLECTOMY    . TOTAL KNEE ARTHROPLASTY Right 05/04/2019   Procedure: RIGHT TOTAL KNEE ARTHROPLASTY;  Surgeon: Melrose Nakayama, MD;  Location: [facility];  Service: Orthopedics;  Laterality: Right;  Marland Kitchen VESICOVAGINAL FISTULA CLOSURE W/ TAH       Current Outpatient Medications  Medication Sig Dispense Refill  . amLODipine (NORVASC) 5 MG tablet Take 5 mg by mouth at bedtime.    Marland Kitchen aspirin EC 81 MG tablet Take 1 tablet (81 mg total) by mouth 2 (two) times daily after a meal. 30 tablet 0  . Calcium-Vitamin D (CALTRATE 600 PLUS-VIT D PO) Take 2 tablets by mouth at bedtime.     . clobetasol ointment (TEMOVATE) 2.29 % Apply 1 application topically 2 (two) times daily as needed (for lichen sclerosus).     Marland Kitchen denosumab (PROLIA) 60 MG/ML SOLN injection Inject 60 mg into the skin every 6 (six) months.     . DHA-EPA-Eve Prim Oil-Vit E (EPA/GLA) CAPS Take 2 capsules by mouth at bedtime. HydroEye Softgels Dry Eye Relief    . losartan (COZAAR) 100 MG tablet Take 100 mg by mouth at bedtime.     . nitrofurantoin, macrocrystal-monohydrate, (MACROBID) 100 MG capsule Take 100 mg by mouth daily.    . Omega-3 Fatty Acids (FISH OIL) 1200 MG CPDR Take 1,200 mg by mouth at bedtime.     Marland Kitchen  rosuvastatin (CRESTOR) 10 MG tablet Take 10 mg by mouth at bedtime.      No current facility-administered medications for this visit.    Allergies:   Sulfonamide derivatives and Tobradex [tobramycin-dexamethasone]    Social History:  The patient  reports that she has never smoked. She has never used smokeless tobacco. She reports that she does not drink alcohol and does not use drugs.   Family History:  The patient's family history includes Congestive Heart Failure in her mother; Coronary artery disease in an other family member; Heart attack (age of onset: 49) in her father; Rheum arthritis in her mother.    ROS:  General:no colds or fevers, no weight changes Skin:no rashes or ulcers HEENT:no blurred vision, no congestion CV:see  HPI PUL:see HPI GI:no diarrhea constipation or melena, no indigestion GU:no hematuria, no dysuria MS:no joint pain, no claudication Neuro:no syncope, no lightheadedness Endo:no diabetes, no thyroid disease She has had her flu vaccine  Wt Readings from Last 3 Encounters:  12/13/19 78 kg  05/04/19 80.3 kg  04/22/19 80.3 kg     PHYSICAL EXAM: VS:  BP (!) 148/86   Pulse 68   Ht 5\' 5"  (1.651 m)   Wt 78 kg   SpO2 98%   BMI 28.62 kg/m  , BMI Body mass index is 28.62 kg/m.   Affect appropriate Healthy:  appears stated age 64: normal Neck supple with no adenopathy JVP normal no bruits no thyromegaly Lungs clear with no wheezing and good diaphragmatic motion Heart:  S1/S2 2/6 SEM murmur, no rub, gallop or click PMI normal Abdomen: benighn, BS positve, no tenderness, no AAA no bruit.  No HSM or HJR Distal pulses intact with no bruits No edema Neuro non-focal Skin warm and dry Post right TKR      EKG:   12/02/18 SR rate 72 PR 222 otherwise normal  12/13/19 SR normal ECG    Recent Labs: 04/22/2019: BUN 21; Creatinine, Ser 0.71; Hemoglobin 14.9; Platelets 265; Potassium 4.3; Sodium 141    Lipid Panel    Component Value Date/Time   CHOL  07/10/2009 0340    130        ATP III CLASSIFICATION:  <200     mg/dL   Desirable  200-239  mg/dL   Borderline High  >=240    mg/dL   High          TRIG 161 (H) 07/10/2009 0340   HDL 43 07/10/2009 0340   CHOLHDL 3.0 07/10/2009 0340   VLDL 32 07/10/2009 0340   LDLCALC  07/10/2009 0340    55        Total Cholesterol/HDL:CHD Risk Coronary Heart Disease Risk Table                     Men   Women  1/2 Average Risk   3.4   3.3  Average Risk       5.0   4.4  2 X Average Risk   9.6   7.1  3 X Average Risk  23.4   11.0        Use the calculated Patient Ratio above and the CHD Risk Table to determine the patient's CHD Risk.        ATP III CLASSIFICATION (LDL):  <100     mg/dL   Optimal  100-129  mg/dL   Near or Above  Optimal  130-159  mg/dL   Borderline  160-189  mg/dL   High  >190     mg/dL   Very High       Other studies Reviewed: Additional studies/ records that were reviewed today include: see above.   ASSESSMENT AND PLAN:  1.  CAD  Relatively low calcium score 4 years ago without angina and on statin.  PCP  Discussed repeat calcium score to further help guide Rx HLD and risk stratify  2.  HTN Well controlled.  Continue current medications and low sodium Dash type diet.    3.  HLD Crestor 10 mg daily labs with primary   4. Lung nodules RM/lingular stable over years presumed benign   5. Ortho: had left knee arthroscopy in 2016 and right TKR April 2021 f/u Dr Rhona Raider   6. First Degree Block:  AV f/u ecg in a year   7. Murmur:  AV sclerosis not bad enough to warrant echo no symptoms will see AV calcium score with #1  Coronary Calcium Score F/U in a year   [employer]

## 2019-12-10 DIAGNOSIS — R350 Frequency of micturition: Secondary | ICD-10-CM | POA: Diagnosis not present

## 2019-12-13 ENCOUNTER — Other Ambulatory Visit: Payer: Self-pay

## 2019-12-13 ENCOUNTER — Ambulatory Visit (INDEPENDENT_AMBULATORY_CARE_PROVIDER_SITE_OTHER): Payer: Medicare Other | Admitting: Cardiovascular Disease

## 2019-12-13 ENCOUNTER — Encounter: Payer: Self-pay | Admitting: Cardiovascular Disease

## 2019-12-13 VITALS — BP 148/86 | HR 68 | Ht 65.0 in | Wt 172.0 lb

## 2019-12-13 DIAGNOSIS — I2510 Atherosclerotic heart disease of native coronary artery without angina pectoris: Secondary | ICD-10-CM | POA: Diagnosis not present

## 2019-12-13 NOTE — Patient Instructions (Addendum)
Medication Instructions:  *If you need a refill on your cardiac medications before your next appointment, please call your pharmacy*  Lab Work: If you have labs (blood work) drawn today and your tests are completely normal, you will receive your results only by: Marland Kitchen MyChart Message (if you have MyChart) OR . A paper copy in the mail If you have any lab test that is abnormal or we need to change your treatment, we will call you to review the results.  Testing/Procedures: Cardiac CT scanning for calcium score, (CAT scanning), is a noninvasive, special x-ray that produces cross-sectional images of the body using x-rays and a computer. CT scans help physicians diagnose and treat medical conditions. For some CT exams, a contrast material is used to enhance visibility in the area of the body being studied. CT scans provide greater clarity and reveal more details than regular x-ray exams.  Follow-Up: At [facility], you and your health needs are our priority.  As part of our continuing mission to provide you with exceptional heart care, we have created designated Provider Care Teams.  These Care Teams include your primary Cardiologist (physician) and Advanced Practice Providers (APPs -  Physician Assistants and Nurse Practitioners) who all work together to provide you with the care you need, when you need it.  We recommend signing up for the patient portal called "MyChart".  Sign up information is provided on this After Visit Summary.  MyChart is used to connect with patients for Virtual Visits (Telemedicine).  Patients are able to view lab/test results, encounter notes, upcoming appointments, etc.  Non-urgent messages can be sent to your provider as well.   To learn more about what you can do with MyChart, go to [email or web address].    Your next appointment:   12 month(s)  The format for your next appointment:   In Person  Provider:   You may see Jenkins Rouge, MD or one of the following  Advanced Practice Providers on your designated Care Team:    Truitt Merle, NP  Cecilie Kicks, NP  Kathyrn Drown, NP

## 2019-12-14 DIAGNOSIS — R350 Frequency of micturition: Secondary | ICD-10-CM | POA: Diagnosis not present

## 2019-12-14 DIAGNOSIS — R3915 Urgency of urination: Secondary | ICD-10-CM | POA: Diagnosis not present

## 2019-12-21 DIAGNOSIS — M810 Age-related osteoporosis without current pathological fracture: Secondary | ICD-10-CM | POA: Diagnosis not present

## 2019-12-21 DIAGNOSIS — D1801 Hemangioma of skin and subcutaneous tissue: Secondary | ICD-10-CM | POA: Diagnosis not present

## 2019-12-21 DIAGNOSIS — L812 Freckles: Secondary | ICD-10-CM | POA: Diagnosis not present

## 2019-12-21 DIAGNOSIS — Z85828 Personal history of other malignant neoplasm of skin: Secondary | ICD-10-CM | POA: Diagnosis not present

## 2019-12-21 DIAGNOSIS — L820 Inflamed seborrheic keratosis: Secondary | ICD-10-CM | POA: Diagnosis not present

## 2019-12-21 DIAGNOSIS — D485 Neoplasm of uncertain behavior of skin: Secondary | ICD-10-CM | POA: Diagnosis not present

## 2019-12-21 DIAGNOSIS — L821 Other seborrheic keratosis: Secondary | ICD-10-CM | POA: Diagnosis not present

## 2019-12-24 DIAGNOSIS — R3915 Urgency of urination: Secondary | ICD-10-CM | POA: Diagnosis not present

## 2019-12-31 DIAGNOSIS — N3941 Urge incontinence: Secondary | ICD-10-CM | POA: Diagnosis not present

## 2020-01-05 ENCOUNTER — Telehealth: Payer: Self-pay

## 2020-01-05 ENCOUNTER — Other Ambulatory Visit: Payer: Self-pay

## 2020-01-05 ENCOUNTER — Ambulatory Visit (INDEPENDENT_AMBULATORY_CARE_PROVIDER_SITE_OTHER)
Admission: RE | Admit: 2020-01-05 | Discharge: 2020-01-05 | Disposition: A | Discharge status: Home / Self Care | Payer: Self-pay | Source: Ambulatory Visit | Attending: Cardiovascular Disease | Admitting: Cardiovascular Disease

## 2020-01-05 DIAGNOSIS — I2510 Atherosclerotic heart disease of native coronary artery without angina pectoris: Secondary | ICD-10-CM

## 2020-01-05 DIAGNOSIS — Z853 Personal history of malignant neoplasm of breast: Secondary | ICD-10-CM

## 2020-01-05 DIAGNOSIS — R918 Other nonspecific abnormal finding of lung field: Secondary | ICD-10-CM

## 2020-01-05 DIAGNOSIS — J8410 Pulmonary fibrosis, unspecified: Secondary | ICD-10-CM

## 2020-01-05 MED ORDER — ROSUVASTATIN CALCIUM 20 MG PO TABS: ORAL_TABLET | ORAL | 3 refills | Status: DC

## 2020-01-05 NOTE — Telephone Encounter (Signed)
Per Dr. 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  Patient will increase her Crestor to 20 mg daily and alternating with 10 mg by mouth daily. Patient will come in on 04/06/19 for repeat lab work. Will order CT to be done in 6 months.

## 2020-01-05 NOTE — Telephone Encounter (Signed)
-----   Message from Josue Hector, MD sent at 01/05/2020 12:31 PM EST ----- Calcium score and percentile has gone up since 2017 72->273 LDL was 75 increase crestor to 20 mg daily alternating with 10 mg daily f/u lipids in 3 months

## 2020-01-18 DIAGNOSIS — N3941 Urge incontinence: Secondary | ICD-10-CM | POA: Diagnosis not present

## 2020-01-18 DIAGNOSIS — R350 Frequency of micturition: Secondary | ICD-10-CM | POA: Diagnosis not present

## 2020-01-19 DIAGNOSIS — L900 Lichen sclerosus et atrophicus: Secondary | ICD-10-CM | POA: Diagnosis not present

## 2020-01-19 DIAGNOSIS — Z01419 Encounter for gynecological examination (general) (routine) without abnormal findings: Secondary | ICD-10-CM | POA: Diagnosis not present

## 2020-01-19 DIAGNOSIS — D649 Anemia, unspecified: Secondary | ICD-10-CM | POA: Diagnosis not present

## 2020-01-19 DIAGNOSIS — Z1231 Encounter for screening mammogram for malignant neoplasm of breast: Secondary | ICD-10-CM | POA: Diagnosis not present

## 2020-01-19 DIAGNOSIS — M545 Low back pain, unspecified: Secondary | ICD-10-CM | POA: Diagnosis not present

## 2020-01-19 DIAGNOSIS — N762 Acute vulvitis: Secondary | ICD-10-CM | POA: Diagnosis not present

## 2020-01-19 DIAGNOSIS — R7309 Other abnormal glucose: Secondary | ICD-10-CM | POA: Diagnosis not present

## 2020-01-20 DIAGNOSIS — N3020 Other chronic cystitis without hematuria: Secondary | ICD-10-CM | POA: Diagnosis not present

## 2020-02-02 DIAGNOSIS — L900 Lichen sclerosus et atrophicus: Secondary | ICD-10-CM | POA: Diagnosis not present

## 2020-02-17 DIAGNOSIS — R350 Frequency of micturition: Secondary | ICD-10-CM | POA: Diagnosis not present

## 2020-02-17 DIAGNOSIS — N3941 Urge incontinence: Secondary | ICD-10-CM | POA: Diagnosis not present

## 2020-03-01 DIAGNOSIS — M545 Low back pain, unspecified: Secondary | ICD-10-CM | POA: Diagnosis not present

## 2020-03-01 DIAGNOSIS — M7061 Trochanteric bursitis, right hip: Secondary | ICD-10-CM | POA: Diagnosis not present

## 2020-03-01 DIAGNOSIS — M7062 Trochanteric bursitis, left hip: Secondary | ICD-10-CM | POA: Diagnosis not present

## 2020-03-09 DIAGNOSIS — R3915 Urgency of urination: Secondary | ICD-10-CM | POA: Diagnosis not present

## 2020-03-09 DIAGNOSIS — R350 Frequency of micturition: Secondary | ICD-10-CM | POA: Diagnosis not present

## 2020-03-13 DIAGNOSIS — M7061 Trochanteric bursitis, right hip: Secondary | ICD-10-CM | POA: Diagnosis not present

## 2020-03-13 DIAGNOSIS — M4696 Unspecified inflammatory spondylopathy, lumbar region: Secondary | ICD-10-CM | POA: Diagnosis not present

## 2020-03-13 DIAGNOSIS — M7062 Trochanteric bursitis, left hip: Secondary | ICD-10-CM | POA: Diagnosis not present

## 2020-03-13 DIAGNOSIS — M6281 Muscle weakness (generalized): Secondary | ICD-10-CM | POA: Diagnosis not present

## 2020-03-16 DIAGNOSIS — M7062 Trochanteric bursitis, left hip: Secondary | ICD-10-CM | POA: Diagnosis not present

## 2020-03-16 DIAGNOSIS — M6281 Muscle weakness (generalized): Secondary | ICD-10-CM | POA: Diagnosis not present

## 2020-03-16 DIAGNOSIS — M7061 Trochanteric bursitis, right hip: Secondary | ICD-10-CM | POA: Diagnosis not present

## 2020-03-16 DIAGNOSIS — M4696 Unspecified inflammatory spondylopathy, lumbar region: Secondary | ICD-10-CM | POA: Diagnosis not present

## 2020-03-20 DIAGNOSIS — M6281 Muscle weakness (generalized): Secondary | ICD-10-CM | POA: Diagnosis not present

## 2020-03-20 DIAGNOSIS — M4696 Unspecified inflammatory spondylopathy, lumbar region: Secondary | ICD-10-CM | POA: Diagnosis not present

## 2020-03-20 DIAGNOSIS — M7062 Trochanteric bursitis, left hip: Secondary | ICD-10-CM | POA: Diagnosis not present

## 2020-03-20 DIAGNOSIS — M7061 Trochanteric bursitis, right hip: Secondary | ICD-10-CM | POA: Diagnosis not present

## 2020-03-22 DIAGNOSIS — M7062 Trochanteric bursitis, left hip: Secondary | ICD-10-CM | POA: Diagnosis not present

## 2020-03-22 DIAGNOSIS — M7061 Trochanteric bursitis, right hip: Secondary | ICD-10-CM | POA: Diagnosis not present

## 2020-03-22 DIAGNOSIS — M6281 Muscle weakness (generalized): Secondary | ICD-10-CM | POA: Diagnosis not present

## 2020-03-22 DIAGNOSIS — M4696 Unspecified inflammatory spondylopathy, lumbar region: Secondary | ICD-10-CM | POA: Diagnosis not present

## 2020-03-27 DIAGNOSIS — M6281 Muscle weakness (generalized): Secondary | ICD-10-CM | POA: Diagnosis not present

## 2020-03-27 DIAGNOSIS — M7062 Trochanteric bursitis, left hip: Secondary | ICD-10-CM | POA: Diagnosis not present

## 2020-03-27 DIAGNOSIS — M4696 Unspecified inflammatory spondylopathy, lumbar region: Secondary | ICD-10-CM | POA: Diagnosis not present

## 2020-03-27 DIAGNOSIS — M7061 Trochanteric bursitis, right hip: Secondary | ICD-10-CM | POA: Diagnosis not present

## 2020-03-29 DIAGNOSIS — M6281 Muscle weakness (generalized): Secondary | ICD-10-CM | POA: Diagnosis not present

## 2020-03-29 DIAGNOSIS — M7061 Trochanteric bursitis, right hip: Secondary | ICD-10-CM | POA: Diagnosis not present

## 2020-03-29 DIAGNOSIS — M4696 Unspecified inflammatory spondylopathy, lumbar region: Secondary | ICD-10-CM | POA: Diagnosis not present

## 2020-03-29 DIAGNOSIS — M7062 Trochanteric bursitis, left hip: Secondary | ICD-10-CM | POA: Diagnosis not present

## 2020-04-05 ENCOUNTER — Other Ambulatory Visit: Payer: Self-pay

## 2020-04-05 ENCOUNTER — Other Ambulatory Visit: Payer: Medicare Other | Admitting: *Deleted

## 2020-04-05 DIAGNOSIS — I2510 Atherosclerotic heart disease of native coronary artery without angina pectoris: Secondary | ICD-10-CM

## 2020-04-05 LAB — HEPATIC FUNCTION PANEL
ALT: 17 IU/L (ref 0–32)
AST: 15 IU/L (ref 0–40)
Albumin: 4.5 g/dL (ref 3.6–4.6)
Alkaline Phosphatase: 52 IU/L (ref 44–121)
Bilirubin Total: 0.7 mg/dL (ref 0.0–1.2)
Bilirubin, Direct: 0.19 mg/dL (ref 0.00–0.40)
Total Protein: 6.9 g/dL (ref 6.0–8.5)

## 2020-04-05 LAB — LIPID PANEL
Chol/HDL Ratio: 2.8 ratio (ref 0.0–4.4)
Cholesterol, Total: 148 mg/dL (ref 100–199)
HDL: 52 mg/dL (ref >39)
LDL Chol Calc (NIH): 68 mg/dL (ref 0–99)
Triglycerides: 166 mg/dL — ABNORMAL HIGH (ref 0–149)
VLDL Cholesterol Cal: 28 mg/dL (ref 5–40)

## 2020-04-06 DIAGNOSIS — N3941 Urge incontinence: Secondary | ICD-10-CM | POA: Diagnosis not present

## 2020-04-06 DIAGNOSIS — R350 Frequency of micturition: Secondary | ICD-10-CM | POA: Diagnosis not present

## 2020-04-10 ENCOUNTER — Telehealth: Payer: Self-pay

## 2020-04-10 DIAGNOSIS — I2510 Atherosclerotic heart disease of native coronary artery without angina pectoris: Secondary | ICD-10-CM

## 2020-04-10 NOTE — Telephone Encounter (Signed)
-----   Message from Josue Hector, MD sent at 04/09/2020  9:55 PM EDT ----- Cholesterol is at goal and LFT's are normal F/U labs in 6 months

## 2020-04-10 NOTE — Telephone Encounter (Signed)
The patient has been notified of the result and verbalized understanding.  All questions (if any) were answered. Michaelyn Barter, RN 04/10/2020 1:54 PM   Patient will come in on 9/26 for repeat lab work.

## 2020-04-12 DIAGNOSIS — M7061 Trochanteric bursitis, right hip: Secondary | ICD-10-CM | POA: Diagnosis not present

## 2020-04-12 DIAGNOSIS — M7062 Trochanteric bursitis, left hip: Secondary | ICD-10-CM | POA: Diagnosis not present

## 2020-05-04 DIAGNOSIS — N3941 Urge incontinence: Secondary | ICD-10-CM | POA: Diagnosis not present

## 2020-05-23 DIAGNOSIS — H353131 Nonexudative age-related macular degeneration, bilateral, early dry stage: Secondary | ICD-10-CM | POA: Diagnosis not present

## 2020-05-23 DIAGNOSIS — H35033 Hypertensive retinopathy, bilateral: Secondary | ICD-10-CM | POA: Diagnosis not present

## 2020-05-23 DIAGNOSIS — H52223 Regular astigmatism, bilateral: Secondary | ICD-10-CM | POA: Diagnosis not present

## 2020-05-23 DIAGNOSIS — H524 Presbyopia: Secondary | ICD-10-CM | POA: Diagnosis not present

## 2020-05-23 DIAGNOSIS — H04123 Dry eye syndrome of bilateral lacrimal glands: Secondary | ICD-10-CM | POA: Diagnosis not present

## 2020-05-23 DIAGNOSIS — H43813 Vitreous degeneration, bilateral: Secondary | ICD-10-CM | POA: Diagnosis not present

## 2020-05-24 DIAGNOSIS — M7061 Trochanteric bursitis, right hip: Secondary | ICD-10-CM | POA: Diagnosis not present

## 2020-05-24 DIAGNOSIS — M7062 Trochanteric bursitis, left hip: Secondary | ICD-10-CM | POA: Diagnosis not present

## 2020-06-01 DIAGNOSIS — R350 Frequency of micturition: Secondary | ICD-10-CM | POA: Diagnosis not present

## 2020-06-01 DIAGNOSIS — N3941 Urge incontinence: Secondary | ICD-10-CM | POA: Diagnosis not present

## 2020-06-07 DIAGNOSIS — M19042 Primary osteoarthritis, left hand: Secondary | ICD-10-CM | POA: Diagnosis not present

## 2020-06-07 DIAGNOSIS — U071 COVID-19: Secondary | ICD-10-CM | POA: Diagnosis not present

## 2020-06-07 DIAGNOSIS — M19041 Primary osteoarthritis, right hand: Secondary | ICD-10-CM | POA: Diagnosis not present

## 2020-06-07 DIAGNOSIS — Z23 Encounter for immunization: Secondary | ICD-10-CM | POA: Diagnosis not present

## 2020-06-07 DIAGNOSIS — I10 Essential (primary) hypertension: Secondary | ICD-10-CM | POA: Diagnosis not present

## 2020-06-14 DIAGNOSIS — M79643 Pain in unspecified hand: Secondary | ICD-10-CM | POA: Diagnosis not present

## 2020-06-14 DIAGNOSIS — E559 Vitamin D deficiency, unspecified: Secondary | ICD-10-CM | POA: Diagnosis not present

## 2020-06-14 DIAGNOSIS — R5383 Other fatigue: Secondary | ICD-10-CM | POA: Diagnosis not present

## 2020-06-14 DIAGNOSIS — M1990 Unspecified osteoarthritis, unspecified site: Secondary | ICD-10-CM | POA: Diagnosis not present

## 2020-06-14 DIAGNOSIS — M810 Age-related osteoporosis without current pathological fracture: Secondary | ICD-10-CM | POA: Diagnosis not present

## 2020-06-21 ENCOUNTER — Other Ambulatory Visit: Payer: Self-pay | Admitting: *Deleted

## 2020-06-21 DIAGNOSIS — I2510 Atherosclerotic heart disease of native coronary artery without angina pectoris: Secondary | ICD-10-CM

## 2020-06-21 NOTE — Progress Notes (Signed)
BMET ordered for upcoming CT, requested by [facility] per Dr. Johnsie Cancel.  Order entered for future collection.

## 2020-06-22 ENCOUNTER — Other Ambulatory Visit: Payer: Medicare Other | Admitting: *Deleted

## 2020-06-22 ENCOUNTER — Other Ambulatory Visit: Payer: Self-pay

## 2020-06-22 DIAGNOSIS — M810 Age-related osteoporosis without current pathological fracture: Secondary | ICD-10-CM | POA: Diagnosis not present

## 2020-06-22 DIAGNOSIS — I2510 Atherosclerotic heart disease of native coronary artery without angina pectoris: Secondary | ICD-10-CM | POA: Diagnosis not present

## 2020-06-22 LAB — BASIC METABOLIC PANEL WITH GFR
BUN/Creatinine Ratio: 30 — ABNORMAL HIGH (ref 12–28)
BUN: 25 mg/dL (ref 8–27)
CO2: 23 mmol/L (ref 20–29)
Calcium: 9.5 mg/dL (ref 8.7–10.3)
Chloride: 103 mmol/L (ref 96–106)
Creatinine, Ser: 0.83 mg/dL (ref 0.57–1.00)
Glucose: 106 mg/dL — ABNORMAL HIGH (ref 65–99)
Potassium: 4.1 mmol/L (ref 3.5–5.2)
Sodium: 141 mmol/L (ref 134–144)
eGFR: 70 mL/min/{1.73_m2}

## 2020-06-29 ENCOUNTER — Ambulatory Visit (HOSPITAL_COMMUNITY)
Admission: RE | Admit: 2020-06-29 | Discharge: 2020-06-29 | Disposition: A | Discharge status: Home / Self Care | Payer: Medicare Other | Source: Ambulatory Visit | Attending: Cardiovascular Disease | Admitting: Cardiovascular Disease

## 2020-06-29 ENCOUNTER — Other Ambulatory Visit: Payer: Self-pay

## 2020-06-29 DIAGNOSIS — J479 Bronchiectasis, uncomplicated: Secondary | ICD-10-CM | POA: Diagnosis not present

## 2020-06-29 DIAGNOSIS — R918 Other nonspecific abnormal finding of lung field: Secondary | ICD-10-CM | POA: Insufficient documentation

## 2020-06-29 DIAGNOSIS — R350 Frequency of micturition: Secondary | ICD-10-CM | POA: Diagnosis not present

## 2020-06-29 DIAGNOSIS — N3941 Urge incontinence: Secondary | ICD-10-CM | POA: Diagnosis not present

## 2020-06-29 DIAGNOSIS — R3915 Urgency of urination: Secondary | ICD-10-CM | POA: Diagnosis not present

## 2020-06-29 DIAGNOSIS — M47814 Spondylosis without myelopathy or radiculopathy, thoracic region: Secondary | ICD-10-CM | POA: Diagnosis not present

## 2020-06-29 DIAGNOSIS — Z853 Personal history of malignant neoplasm of breast: Secondary | ICD-10-CM | POA: Diagnosis not present

## 2020-06-29 DIAGNOSIS — I2510 Atherosclerotic heart disease of native coronary artery without angina pectoris: Secondary | ICD-10-CM | POA: Insufficient documentation

## 2020-06-29 DIAGNOSIS — J8410 Pulmonary fibrosis, unspecified: Secondary | ICD-10-CM | POA: Diagnosis not present

## 2020-08-03 DIAGNOSIS — N3941 Urge incontinence: Secondary | ICD-10-CM | POA: Diagnosis not present

## 2020-09-27 DIAGNOSIS — N3946 Mixed incontinence: Secondary | ICD-10-CM | POA: Diagnosis not present

## 2020-09-27 DIAGNOSIS — N3020 Other chronic cystitis without hematuria: Secondary | ICD-10-CM | POA: Diagnosis not present

## 2020-09-29 DIAGNOSIS — Z23 Encounter for immunization: Secondary | ICD-10-CM | POA: Diagnosis not present

## 2020-10-05 DIAGNOSIS — N3941 Urge incontinence: Secondary | ICD-10-CM | POA: Diagnosis not present

## 2020-10-16 ENCOUNTER — Other Ambulatory Visit: Payer: Self-pay

## 2020-10-16 ENCOUNTER — Other Ambulatory Visit: Payer: Medicare Other | Admitting: *Deleted

## 2020-10-16 DIAGNOSIS — I2510 Atherosclerotic heart disease of native coronary artery without angina pectoris: Secondary | ICD-10-CM | POA: Diagnosis not present

## 2020-10-16 LAB — LIPID PANEL
Chol/HDL Ratio: 2.4 ratio (ref 0.0–4.4)
Cholesterol, Total: 146 mg/dL (ref 100–199)
HDL: 60 mg/dL (ref >39)
LDL Chol Calc (NIH): 66 mg/dL (ref 0–99)
Triglycerides: 111 mg/dL (ref 0–149)
VLDL Cholesterol Cal: 20 mg/dL (ref 5–40)

## 2020-10-16 LAB — HEPATIC FUNCTION PANEL
ALT: 16 IU/L (ref 0–32)
AST: 18 IU/L (ref 0–40)
Albumin: 4.4 g/dL (ref 3.6–4.6)
Alkaline Phosphatase: 53 IU/L (ref 44–121)
Bilirubin Total: 0.6 mg/dL (ref 0.0–1.2)
Bilirubin, Direct: 0.14 mg/dL (ref 0.00–0.40)
Total Protein: 6.8 g/dL (ref 6.0–8.5)

## 2020-10-26 DIAGNOSIS — N3941 Urge incontinence: Secondary | ICD-10-CM | POA: Diagnosis not present

## 2020-11-16 DIAGNOSIS — R3915 Urgency of urination: Secondary | ICD-10-CM | POA: Diagnosis not present

## 2020-11-16 DIAGNOSIS — R350 Frequency of micturition: Secondary | ICD-10-CM | POA: Diagnosis not present

## 2020-11-16 DIAGNOSIS — N3941 Urge incontinence: Secondary | ICD-10-CM | POA: Diagnosis not present

## 2020-11-28 DIAGNOSIS — N3020 Other chronic cystitis without hematuria: Secondary | ICD-10-CM | POA: Diagnosis not present

## 2020-12-04 NOTE — Progress Notes (Signed)
Cardiology Office Note   Date:  12/11/2020   ID:  [name], DOB 1935-05-31, MRN [id number]  PCP:  Deland Pretty, MD  Cardiologist:  Dr. Johnsie Cancel     History of Present Illness:  85 y.o. f/u atypical chest pain. First seen 2011 pain in setting of pneumonia. TTE with LVH EF 60% Myovue normal no ischemia.  Calcium score 10/23/15  Was 52 50th percentile for age and sex. F/U score 01/05/20 273 65 th percentile On statin with LDL 66  Benign RM/lingular nodules thought benign stable since 2014  Labs followed by Dr Shelia Media. Two drug Rx for HTN  08/10/14 Left knee arthroscopy Dr Maureen Ralphs for medial meniscus tear Now seeing Daldorf Had right TKR 05/04/19   05/08/17 laparoscopic cholecystectomy Dr Lucia Gaskins   No cardiac complaints   Husband fell and broke his hip and has a bit of a rehab struggle  Past Medical History:  Diagnosis Date   Acne rosacea    Arthritis    Breast cancer ([place])    Has had a prior right right mastectomy and chemotherapy   Cardiomegaly    Normal echocardiogram and cardiac workup by Dr. Johnsie Cancel   Chest pain, unspecified    Hyperlipidemia    Hypertension    Irritable bladder    Lymphedema of arm    Right, post lumph node infection   Osteopenia    Pneumonia    5 years ago   Polyp of gallbladder    Thyroid nodule     Past Surgical History:  Procedure Laterality Date   ABDOMINAL HYSTERECTOMY     CHOLECYSTECTOMY N/A 05/08/2017   Procedure: LAPAROSCOPIC CHOLECYSTECTOMY WITH INTRAOPERATIVE CHOLANGIOGRAM;  Surgeon: Alphonsa Overall, MD;  Location: [facility];  Service: General;  Laterality: N/A;  ERAS PATHWAY   EYE SURGERY     cataract surgery bilateral   KNEE ARTHROSCOPY Left 08/10/2014   Procedure: LEFT KNEE ARTHROSCOPY WITH MENSCIAL DEBRIDEMENT CONDROPLASTY;  Surgeon: Gaynelle Arabian, MD;  Location: [facility];  Service: Orthopedics;  Laterality: Left;   MASTECTOMY     right breast   RECONSTRUCTION BREAST W/ TRAM FLAP  1997   after right mastectomy   TONSILLECTOMY     TOTAL  KNEE ARTHROPLASTY Right 05/04/2019   Procedure: RIGHT TOTAL KNEE ARTHROPLASTY;  Surgeon: Melrose Nakayama, MD;  Location: [facility];  Service: Orthopedics;  Laterality: Right;   VESICOVAGINAL FISTULA CLOSURE W/ TAH       Current Outpatient Medications  Medication Sig Dispense Refill   amLODipine (NORVASC) 5 MG tablet Take 5 mg by mouth at bedtime.     aspirin EC 81 MG tablet Take 1 tablet (81 mg total) by mouth 2 (two) times daily after a meal. 30 tablet 0   Calcium-Vitamin D (CALTRATE 600 PLUS-VIT D PO) Take 2 tablets by mouth at bedtime.      ciprofloxacin (CIPRO) 250 MG tablet Take 250 mg by mouth 2 (two) times daily.     clobetasol ointment (TEMOVATE) 9.37 % Apply 1 application topically 2 (two) times daily as needed (for lichen sclerosus).      denosumab (PROLIA) 60 MG/ML SOLN injection Inject 60 mg into the skin every 6 (six) months.      DHA-EPA-Eve Prim Oil-Vit E (EPA/GLA) CAPS Take 2 capsules by mouth at bedtime. HydroEye Softgels Dry Eye Relief     losartan (COZAAR) 100 MG tablet Take 100 mg by mouth at bedtime.      Omega-3 Fatty Acids (FISH OIL) 1200 MG CPDR Take 1,200 mg by  mouth at bedtime.      rosuvastatin (CRESTOR) 20 MG tablet Take one tablet 20 mg by mouth every other day. Take 1/2 tablet 10 mg by mouth on alternating days. 90 tablet 3   nitrofurantoin, macrocrystal-monohydrate, (MACROBID) 100 MG capsule Take 100 mg by mouth daily. (Patient not taking: Reported on 12/11/2020)     No current facility-administered medications for this visit.    Allergies:   Sulfonamide derivatives and Tobradex [tobramycin-dexamethasone]    Social History:  The patient  reports that she has never smoked. She has never used smokeless tobacco. She reports that she does not drink alcohol and does not use drugs.   Family History:  The patient's family history includes Congestive Heart Failure in her mother; Coronary artery disease in an other family member; Heart attack (age of onset: 7) in her  father; Rheum arthritis in her mother.    ROS:  General:no colds or fevers, no weight changes Skin:no rashes or ulcers HEENT:no blurred vision, no congestion CV:see HPI PUL:see HPI GI:no diarrhea constipation or melena, no indigestion GU:no hematuria, no dysuria MS:no joint pain, no claudication Neuro:no syncope, no lightheadedness Endo:no diabetes, no thyroid disease She has had her flu vaccine  Wt Readings from Last 3 Encounters:  12/11/20 172 lb (78 kg)  12/13/19 172 lb (78 kg)  05/04/19 177 lb 0.1 oz (80.3 kg)     PHYSICAL EXAM: VS:  BP 130/76   Pulse 68   Ht 5\' 5"  (1.651 m)   Wt 172 lb (78 kg)   SpO2 98%   BMI 28.62 kg/m  , BMI Body mass index is 28.62 kg/m.   Affect appropriate Healthy:  appears stated age 68: normal Neck supple with no adenopathy JVP normal no bruits no thyromegaly Lungs clear with no wheezing and good diaphragmatic motion Heart:  S1/S2 2/6 SEM murmur, no rub, gallop or click PMI normal Abdomen: benighn, BS positve, no tenderness, no AAA no bruit.  No HSM or HJR Distal pulses intact with no bruits No edema Neuro non-focal Skin warm and dry Post right TKR      EKG:   12/02/18 SR rate 72 PR 222 otherwise normal  12/13/19 SR normal ECG  12/11/2020 NSR rate 68 normal    Recent Labs: 06/22/2020: BUN 25; Creatinine, Ser 0.83; Potassium 4.1; Sodium 141 10/16/2020: ALT 16    Lipid Panel    Component Value Date/Time   CHOL 146 10/16/2020 0822   TRIG 111 10/16/2020 0822   HDL 60 10/16/2020 0822   CHOLHDL 2.4 10/16/2020 0822   CHOLHDL 3.0 07/10/2009 0340   VLDL 32 07/10/2009 0340   LDLCALC 66 10/16/2020 0822       Other studies Reviewed: Additional studies/ records that were reviewed today include: see above.   ASSESSMENT AND PLAN:  1.  CAD  Relatively low calcium score 4 years ago without angina and on statin.  Calcium score 01/05/20 273 65 th percentile Calcium noted in LAD/LCX  2.  HTN Well controlled.  Continue current  medications and low sodium Dash type diet.    3.  HLD Crestor 10 mg daily labs with primary LDL at goal 66  4. Lung nodules RM/lingular stable over years presumed benign and granulomas by most recent CT 06/30/20   5. Ortho: had left knee arthroscopy in 2016 and right TKR April 2021 f/u Dr Rhona Raider   6. First Degree Block:  AV f/u ecg in a year   7. Murmur:  AV sclerosis not bad enough to  warrant echo no symptoms     F/U in a year   Jenkins Rouge

## 2020-12-06 DIAGNOSIS — Z85828 Personal history of other malignant neoplasm of skin: Secondary | ICD-10-CM | POA: Diagnosis not present

## 2020-12-06 DIAGNOSIS — L821 Other seborrheic keratosis: Secondary | ICD-10-CM | POA: Diagnosis not present

## 2020-12-06 DIAGNOSIS — L438 Other lichen planus: Secondary | ICD-10-CM | POA: Diagnosis not present

## 2020-12-06 DIAGNOSIS — L820 Inflamed seborrheic keratosis: Secondary | ICD-10-CM | POA: Diagnosis not present

## 2020-12-06 DIAGNOSIS — L812 Freckles: Secondary | ICD-10-CM | POA: Diagnosis not present

## 2020-12-06 DIAGNOSIS — D485 Neoplasm of uncertain behavior of skin: Secondary | ICD-10-CM | POA: Diagnosis not present

## 2020-12-06 DIAGNOSIS — D1801 Hemangioma of skin and subcutaneous tissue: Secondary | ICD-10-CM | POA: Diagnosis not present

## 2020-12-07 DIAGNOSIS — N3941 Urge incontinence: Secondary | ICD-10-CM | POA: Diagnosis not present

## 2020-12-08 DIAGNOSIS — E7800 Pure hypercholesterolemia, unspecified: Secondary | ICD-10-CM | POA: Diagnosis not present

## 2020-12-08 DIAGNOSIS — N3020 Other chronic cystitis without hematuria: Secondary | ICD-10-CM | POA: Diagnosis not present

## 2020-12-08 DIAGNOSIS — E559 Vitamin D deficiency, unspecified: Secondary | ICD-10-CM | POA: Diagnosis not present

## 2020-12-08 DIAGNOSIS — N3946 Mixed incontinence: Secondary | ICD-10-CM | POA: Diagnosis not present

## 2020-12-08 DIAGNOSIS — Z0000 Encounter for general adult medical examination without abnormal findings: Secondary | ICD-10-CM | POA: Diagnosis not present

## 2020-12-08 DIAGNOSIS — I10 Essential (primary) hypertension: Secondary | ICD-10-CM | POA: Diagnosis not present

## 2020-12-08 DIAGNOSIS — M810 Age-related osteoporosis without current pathological fracture: Secondary | ICD-10-CM | POA: Diagnosis not present

## 2020-12-11 ENCOUNTER — Ambulatory Visit (INDEPENDENT_AMBULATORY_CARE_PROVIDER_SITE_OTHER): Payer: Medicare Other | Admitting: Cardiovascular Disease

## 2020-12-11 ENCOUNTER — Encounter: Payer: Self-pay | Admitting: Cardiovascular Disease

## 2020-12-11 ENCOUNTER — Other Ambulatory Visit: Payer: Self-pay

## 2020-12-11 VITALS — BP 130/76 | HR 68 | Ht 65.0 in | Wt 172.0 lb

## 2020-12-11 DIAGNOSIS — R011 Cardiac murmur, unspecified: Secondary | ICD-10-CM | POA: Diagnosis not present

## 2020-12-11 DIAGNOSIS — I2510 Atherosclerotic heart disease of native coronary artery without angina pectoris: Secondary | ICD-10-CM | POA: Diagnosis not present

## 2020-12-11 DIAGNOSIS — I10 Essential (primary) hypertension: Secondary | ICD-10-CM | POA: Diagnosis not present

## 2020-12-11 NOTE — Patient Instructions (Signed)
Medication Instructions:  The current medical regimen is effective;  continue present plan and medications.  *If you need a refill on your cardiac medications before your next appointment, please call your pharmacy*  Follow-Up: At [facility], you and your health needs are our priority.  As part of our continuing mission to provide you with exceptional heart care, we have created designated Provider Care Teams.  These Care Teams include your primary Cardiologist (physician) and Advanced Practice Providers (APPs -  Physician Assistants and Nurse Practitioners) who all work together to provide you with the care you need, when you need it.  We recommend signing up for the patient portal called "MyChart".  Sign up information is provided on this After Visit Summary.  MyChart is used to connect with patients for Virtual Visits (Telemedicine).  Patients are able to view lab/test results, encounter notes, upcoming appointments, etc.  Non-urgent messages can be sent to your provider as well.   To learn more about what you can do with MyChart, go to [email or web address].    Your next appointment:   1 year(s)  The format for your next appointment:   In Person  Provider:   Peter Nishan, MD     Thank you for choosing [facility]!!    

## 2020-12-18 DIAGNOSIS — E559 Vitamin D deficiency, unspecified: Secondary | ICD-10-CM | POA: Diagnosis not present

## 2020-12-18 DIAGNOSIS — M6530 Trigger finger, unspecified finger: Secondary | ICD-10-CM | POA: Diagnosis not present

## 2020-12-18 DIAGNOSIS — R5383 Other fatigue: Secondary | ICD-10-CM | POA: Diagnosis not present

## 2020-12-18 DIAGNOSIS — M1990 Unspecified osteoarthritis, unspecified site: Secondary | ICD-10-CM | POA: Diagnosis not present

## 2020-12-18 DIAGNOSIS — M79643 Pain in unspecified hand: Secondary | ICD-10-CM | POA: Diagnosis not present

## 2020-12-18 DIAGNOSIS — M810 Age-related osteoporosis without current pathological fracture: Secondary | ICD-10-CM | POA: Diagnosis not present

## 2020-12-27 DIAGNOSIS — K5730 Diverticulosis of large intestine without perforation or abscess without bleeding: Secondary | ICD-10-CM | POA: Diagnosis not present

## 2020-12-27 DIAGNOSIS — N3020 Other chronic cystitis without hematuria: Secondary | ICD-10-CM | POA: Diagnosis not present

## 2020-12-27 DIAGNOSIS — Z87440 Personal history of urinary (tract) infections: Secondary | ICD-10-CM | POA: Diagnosis not present

## 2020-12-27 DIAGNOSIS — N2882 Megaloureter: Secondary | ICD-10-CM | POA: Diagnosis not present

## 2020-12-27 DIAGNOSIS — M810 Age-related osteoporosis without current pathological fracture: Secondary | ICD-10-CM | POA: Diagnosis not present

## 2020-12-27 DIAGNOSIS — N3289 Other specified disorders of bladder: Secondary | ICD-10-CM | POA: Diagnosis not present

## 2020-12-28 DIAGNOSIS — N3941 Urge incontinence: Secondary | ICD-10-CM | POA: Diagnosis not present

## 2021-01-03 DIAGNOSIS — N3020 Other chronic cystitis without hematuria: Secondary | ICD-10-CM | POA: Diagnosis not present

## 2021-01-04 DIAGNOSIS — Z23 Encounter for immunization: Secondary | ICD-10-CM | POA: Diagnosis not present

## 2021-01-18 DIAGNOSIS — R350 Frequency of micturition: Secondary | ICD-10-CM | POA: Diagnosis not present

## 2021-01-18 DIAGNOSIS — R3915 Urgency of urination: Secondary | ICD-10-CM | POA: Diagnosis not present

## 2021-01-18 DIAGNOSIS — N3941 Urge incontinence: Secondary | ICD-10-CM | POA: Diagnosis not present

## 2021-01-24 DIAGNOSIS — Z1231 Encounter for screening mammogram for malignant neoplasm of breast: Secondary | ICD-10-CM | POA: Diagnosis not present

## 2021-01-24 DIAGNOSIS — Z01419 Encounter for gynecological examination (general) (routine) without abnormal findings: Secondary | ICD-10-CM | POA: Diagnosis not present

## 2021-01-24 DIAGNOSIS — L900 Lichen sclerosus et atrophicus: Secondary | ICD-10-CM | POA: Diagnosis not present

## 2021-02-08 DIAGNOSIS — N3941 Urge incontinence: Secondary | ICD-10-CM | POA: Diagnosis not present

## 2021-02-12 ENCOUNTER — Other Ambulatory Visit: Payer: Self-pay | Admitting: Cardiovascular Disease

## 2021-02-22 ENCOUNTER — Ambulatory Visit (INDEPENDENT_AMBULATORY_CARE_PROVIDER_SITE_OTHER): Payer: Medicare Other

## 2021-02-22 ENCOUNTER — Ambulatory Visit
Admission: EM | Admit: 2021-02-22 | Discharge: 2021-02-22 | Disposition: A | Discharge status: Home / Self Care | Payer: Medicare Other | Attending: Internal Medicine | Admitting: Internal Medicine

## 2021-02-22 ENCOUNTER — Other Ambulatory Visit: Payer: Self-pay

## 2021-02-22 ENCOUNTER — Encounter: Payer: Self-pay | Admitting: Emergency Medicine

## 2021-02-22 DIAGNOSIS — R030 Elevated blood-pressure reading, without diagnosis of hypertension: Secondary | ICD-10-CM

## 2021-02-22 DIAGNOSIS — R0602 Shortness of breath: Secondary | ICD-10-CM

## 2021-02-22 DIAGNOSIS — U071 COVID-19: Secondary | ICD-10-CM

## 2021-02-22 MED ORDER — MOLNUPIRAVIR EUA 200MG CAPSULE: 4.0000 | ORAL_CAPSULE | Freq: Two times a day (BID) | ORAL | 0 refills | Status: AC

## 2021-02-22 NOTE — ED Provider Notes (Signed)
[facility]    CSN: [id number] Arrival date & time: 02/22/21  5053      History   Chief Complaint Chief Complaint  Patient presents with   Covid Positive    HPI [name] is a 86 y.o. female.   Patient presents after testing positive for COVID-19 with an at home test this morning.  She reports that she began having runny nose and fatigue yesterday.  She also has had been having some shortness of breath that started this morning.  Shortness of breath is only present with exertion.  Denies history of asthma or COPD.  She also reports that she was sick last week with a cold.  Those symptoms resolved and she was symptom-free approximately 4 days ago.  These are new symptoms per patient.  Denies any known sick contacts.  Denies any known fevers.  Denies chest pain, sore throat, ear pain, nausea, vomiting, diarrhea, abdominal pain.  Patient denies taking any medications to help alleviate symptoms.    Past Medical History:  Diagnosis Date   Acne rosacea    Arthritis    Breast cancer ([place])    Has had a prior right right mastectomy and chemotherapy   Cardiomegaly    Normal echocardiogram and cardiac workup by Dr. Johnsie Cancel   Chest pain, unspecified    Hyperlipidemia    Hypertension    Irritable bladder    Lymphedema of arm    Right, post lumph node infection   Osteopenia    Pneumonia    5 years ago   Polyp of gallbladder    Thyroid nodule     Patient Active Problem List   Diagnosis Date Noted   Primary osteoarthritis of right knee 05/04/2019   Acute medial meniscal tear 08/10/2014   Pulmonary nodules 04/07/2013   First degree atrioventricular block 08/27/2011   POLYP, GALLBLADDER 09/18/2009   Other specified disorders of bladder 09/18/2009   ACNE ROSACEA 09/18/2009   OSTEOPENIA 09/18/2009   THYROID NODULE, HX OF 09/18/2009   CHEST PAIN-UNSPECIFIED 07/27/2009   HYPERLIPIDEMIA 07/25/2009   Essential hypertension 07/25/2009    Past Surgical History:   Procedure Laterality Date   ABDOMINAL HYSTERECTOMY     CHOLECYSTECTOMY N/A 05/08/2017   Procedure: LAPAROSCOPIC CHOLECYSTECTOMY WITH INTRAOPERATIVE CHOLANGIOGRAM;  Surgeon: Alphonsa Overall, MD;  Location: [facility];  Service: General;  Laterality: N/A;  ERAS PATHWAY   EYE SURGERY     cataract surgery bilateral   KNEE ARTHROSCOPY Left 08/10/2014   Procedure: LEFT KNEE ARTHROSCOPY WITH MENSCIAL DEBRIDEMENT CONDROPLASTY;  Surgeon: Gaynelle Arabian, MD;  Location: [facility];  Service: Orthopedics;  Laterality: Left;   MASTECTOMY     right breast   RECONSTRUCTION BREAST W/ TRAM FLAP  1997   after right mastectomy   TONSILLECTOMY     TOTAL KNEE ARTHROPLASTY Right 05/04/2019   Procedure: RIGHT TOTAL KNEE ARTHROPLASTY;  Surgeon: Melrose Nakayama, MD;  Location: [facility];  Service: Orthopedics;  Laterality: Right;   VESICOVAGINAL FISTULA CLOSURE W/ TAH      OB History   No obstetric history on file.      Home Medications    Prior to Admission medications   Medication Sig Start Date End Date Taking? Authorizing Provider  molnupiravir EUA (LAGEVRIO) 200 mg CAPS capsule Take 4 capsules (800 mg total) by mouth 2 (two) times daily for 5 days. 02/22/21 02/27/21 Yes Celise Bazar, Hildred Alamin E, FNP  amLODipine (NORVASC) 5 MG tablet Take 5 mg by mouth at bedtime.    [provider]  aspirin EC 81 MG tablet Take 1 tablet (81 mg total) by mouth 2 (two) times daily after a meal. 05/05/19   Loni Dolly, PA-C  Calcium-Vitamin D (CALTRATE 600 PLUS-VIT D PO) Take 2 tablets by mouth at bedtime.     [provider]  ciprofloxacin (CIPRO) 250 MG tablet Take 250 mg by mouth 2 (two) times daily. 12/08/20   [provider]  clobetasol ointment (TEMOVATE) 1.32 % Apply 1 application topically 2 (two) times daily as needed (for lichen sclerosus).     [provider]  denosumab (PROLIA) 60 MG/ML SOLN injection Inject 60 mg into the skin every 6 (six) months.     [provider]  DHA-EPA-Eve Prim  Oil-Vit E (EPA/GLA) CAPS Take 2 capsules by mouth at bedtime. HydroEye Softgels Dry Eye Relief    [provider]  losartan (COZAAR) 100 MG tablet Take 100 mg by mouth at bedtime.     [provider]  nitrofurantoin, macrocrystal-monohydrate, (MACROBID) 100 MG capsule Take 100 mg by mouth daily. Patient not taking: Reported on 12/11/2020 11/15/19   [provider]  Omega-3 Fatty Acids (FISH OIL) 1200 MG CPDR Take 1,200 mg by mouth at bedtime.     [provider]  rosuvastatin (CRESTOR) 20 MG tablet TAKE 1 TABLET EVERY OTHER DAY ALTERNATING WITH 1/2 TABLET EVERY OTHER DAY. 02/12/21   Josue Hector, MD    Family History Family History  Problem Relation Age of Onset   Rheum arthritis Mother    Congestive Heart Failure Mother    Heart attack Father 64   Coronary artery disease Other     Social History Social History   Tobacco Use   Smoking status: Never   Smokeless tobacco: Never  Vaping Use   Vaping Use: Never used  Substance Use Topics   Alcohol use: No   Drug use: No     Allergies   Sulfonamide derivatives and Tobradex [tobramycin-dexamethasone]   Review of Systems Review of Systems Per HPI  Physical Exam Triage Vital Signs ED Triage Vitals [02/22/21 0953]  Enc Vitals Group     BP (!) 182/83     Pulse Rate 73     Resp 16     Temp 98.1 F (36.7 C)     Temp Source Oral     SpO2 95 %     Weight      Height      Head Circumference      Peak Flow      Pain Score 0     Pain Loc      Pain Edu?      Excl. in [place]?    No data found.  Updated Vital Signs BP (!) 182/83 (BP Location: Left Arm)    Pulse 73    Temp 98.1 F (36.7 C) (Oral)    Resp 16    SpO2 95%   Visual Acuity Right Eye Distance:   Left Eye Distance:   Bilateral Distance:    Right Eye Near:   Left Eye Near:    Bilateral Near:     Physical Exam Constitutional:      General: She is not in acute distress.    Appearance: Normal appearance. She is not  toxic-appearing or diaphoretic.  HENT:     Head: Normocephalic and atraumatic.     Right Ear: Tympanic membrane and ear canal normal.     Left Ear: Tympanic membrane and ear canal normal.  Nose: Congestion present.     Mouth/Throat:     Mouth: Mucous membranes are moist.     Pharynx: No posterior oropharyngeal erythema.  Eyes:     Extraocular Movements: Extraocular movements intact.     Conjunctiva/sclera: Conjunctivae normal.     Pupils: Pupils are equal, round, and reactive to light.  Cardiovascular:     Rate and Rhythm: Normal rate and regular rhythm.     Pulses: Normal pulses.     Heart sounds: Normal heart sounds.  Pulmonary:     Effort: Pulmonary effort is normal. No respiratory distress.     Breath sounds: Normal breath sounds. No stridor. No wheezing, rhonchi or rales.  Abdominal:     General: Abdomen is flat. Bowel sounds are normal.     Palpations: Abdomen is soft.  Musculoskeletal:        General: Normal range of motion.     Cervical back: Normal range of motion.  Skin:    General: Skin is warm and dry.  Neurological:     General: No focal deficit present.     Mental Status: She is alert and oriented to person, place, and time. Mental status is at baseline.     Cranial Nerves: Cranial nerves 2-12 are intact.     Sensory: Sensation is intact.     Motor: Motor function is intact.     Coordination: Coordination is intact.     Gait: Gait is intact.  Psychiatric:        Mood and Affect: Mood normal.        Behavior: Behavior normal.     UC Treatments / Results  Labs (all labs ordered are listed, but only abnormal results are displayed) Labs Reviewed  NOVEL CORONAVIRUS, NAA    EKG   Radiology DG Chest 2 View  Result Date: 02/22/2021 CLINICAL DATA:  Shortness of breath EXAM: CHEST - 2 VIEW COMPARISON:  04/22/2019 FINDINGS: Right axillary and right breast postsurgical changes with surgical clips in place. No focal consolidation. No pleural effusion or  pneumothorax. Heart and mediastinal contours are unremarkable. No acute osseous abnormality. IMPRESSION: No active cardiopulmonary disease. Electronically Signed   By: Kathreen Devoid M.D.   On: 02/22/2021 10:33    Procedures Procedures (including critical care time)  Medications Ordered in UC Medications - No data to display  Initial Impression / Assessment and Plan / UC Course  I have reviewed the triage vital signs and the nursing notes.  Pertinent labs & imaging results that were available during my care of the patient were reviewed by me and considered in my medical decision making (see chart for details).     Patient tested positive for COVID-19 with an at home test.  Patient requesting COVID-19 retest.  COVID-19 PCR pending per patient request.  Discussed antivirals with patient and she would like to be prescribed this medication. Molnupiravir prescribed for patient.  Discussed symptomatic treatment with patient as well.  Offered patient albuterol inhaler for intermittent shortness of breath but patient declined.  Chest x-ray completed given shortness of breath and that patient was recently sick as well to check for pneumonia but chest x-ray was negative for any acute cardiopulmonary process.  Patient also has elevated blood pressure reading but reports that she been taking her medications as prescribed.  Denies any signs of hypertensive urgency, neuro exam is normal, no signs of endorgan damage.  Advised patient to monitor at home with home blood pressure cuff.  Blood pressure did recover slightly  to 722 systolic prior to discharge.  Patient to follow with PCP if it remains elevated.  Discussed return and ER precautions.  Patient verbalized understanding and was agreeable with plan. Final Clinical Impressions(s) / UC Diagnoses   Final diagnoses:  [id number]  Elevated blood pressure reading     Discharge Instructions      You have the COVID-19 which is being treated with COVID-19  antiviral.  Please pick this up from the pharmacy.  You must quarantine 5 days from symptom start.  Must wear a mask for an additional 5 days following that.  Please follow-up with primary care doctor for further evaluation and management.  Go to the hospital if shortness of breath worsens.  Your chest x-ray was normal.     ED Prescriptions     Medication Sig Dispense Auth. Provider   molnupiravir EUA (LAGEVRIO) 200 mg CAPS capsule Take 4 capsules (800 mg total) by mouth 2 (two) times daily for 5 days. 40 capsule [place], Michele Rockers, [place]      PDMP not reviewed this encounter.   Teodora Medici, [place] 02/22/21 1054

## 2021-02-22 NOTE — ED Triage Notes (Signed)
Patient began having runny nose yesterday. Tested positive for covid at home this morning. Also c/o fatigue. Reports new mild SOB starting this morning, does appear to having increased work of breathing in triage. Denies pain

## 2021-02-22 NOTE — Discharge Instructions (Signed)
You have the COVID-19 which is being treated with COVID-19 antiviral.  Please pick this up from the pharmacy.  You must quarantine 5 days from symptom start.  Must wear a mask for an additional 5 days following that.  Please follow-up with primary care doctor for further evaluation and management.  Go to the hospital if shortness of breath worsens.  Your chest x-ray was normal.

## 2021-02-23 ENCOUNTER — Ambulatory Visit: Payer: Self-pay | Admitting: *Deleted

## 2021-02-23 LAB — NOVEL CORONAVIRUS, NAA: SARS-CoV-2, NAA: NOT DETECTED

## 2021-02-23 LAB — SARS-COV-2, NAA 2 DAY TAT

## 2021-02-23 NOTE — Telephone Encounter (Signed)
Summary: COVID testing advice   Pt is calling get a clairity on her negative COVID test. Pt concerned about a false negative test.     Called patient and patient would like to know if she is positive for covid or not. Symptoms started Wednesday 02/21/21 took at home covid test Thursday 02/22/21 and was positive. Went to ED 02/22/21 and took another covid test and was negative. Patient requesting if she should conintinue to take antiviral medication molnuparivr . Recommended patient use precautions for being positive for covid and contact her PCP for further questions regarding taking antiviral medication if she does not want to take it. Reviewed CDC website to go online for patient to get free covid tests.    Reason for Disposition  [1] Caller requesting NON-URGENT health information AND [2] PCP's office is the best resource  Answer Assessment - Initial Assessment Questions 1. REASON FOR CALL or QUESTION: "What is your reason for calling today?" or "How can I best help you?" or "What question do you have that I can help answer?"     Patient requesting if she should take medication molnupavir since she tested positive with at home covid test but negative while at ED.  Protocols used: Information Only Call - No Triage-A-AH

## 2021-03-01 DIAGNOSIS — N3020 Other chronic cystitis without hematuria: Secondary | ICD-10-CM | POA: Diagnosis not present

## 2021-03-01 DIAGNOSIS — N3941 Urge incontinence: Secondary | ICD-10-CM | POA: Diagnosis not present

## 2021-03-01 DIAGNOSIS — R350 Frequency of micturition: Secondary | ICD-10-CM | POA: Diagnosis not present

## 2021-03-08 DIAGNOSIS — H35033 Hypertensive retinopathy, bilateral: Secondary | ICD-10-CM | POA: Diagnosis not present

## 2021-03-08 DIAGNOSIS — H524 Presbyopia: Secondary | ICD-10-CM | POA: Diagnosis not present

## 2021-03-08 DIAGNOSIS — H353121 Nonexudative age-related macular degeneration, left eye, early dry stage: Secondary | ICD-10-CM | POA: Diagnosis not present

## 2021-03-08 DIAGNOSIS — H0288A Meibomian gland dysfunction right eye, upper and lower eyelids: Secondary | ICD-10-CM | POA: Diagnosis not present

## 2021-03-08 DIAGNOSIS — H52223 Regular astigmatism, bilateral: Secondary | ICD-10-CM | POA: Diagnosis not present

## 2021-03-08 DIAGNOSIS — H43813 Vitreous degeneration, bilateral: Secondary | ICD-10-CM | POA: Diagnosis not present

## 2021-03-14 DIAGNOSIS — R159 Full incontinence of feces: Secondary | ICD-10-CM | POA: Diagnosis not present

## 2021-03-14 DIAGNOSIS — R932 Abnormal findings on diagnostic imaging of liver and biliary tract: Secondary | ICD-10-CM | POA: Diagnosis not present

## 2021-03-14 DIAGNOSIS — R1011 Right upper quadrant pain: Secondary | ICD-10-CM | POA: Diagnosis not present

## 2021-03-22 DIAGNOSIS — N3941 Urge incontinence: Secondary | ICD-10-CM | POA: Diagnosis not present

## 2021-04-17 DIAGNOSIS — N3941 Urge incontinence: Secondary | ICD-10-CM | POA: Diagnosis not present

## 2021-05-08 DIAGNOSIS — N3941 Urge incontinence: Secondary | ICD-10-CM | POA: Diagnosis not present

## 2021-05-17 DIAGNOSIS — Z20822 Contact with and (suspected) exposure to covid-19: Secondary | ICD-10-CM | POA: Diagnosis not present

## 2021-05-29 DIAGNOSIS — N3941 Urge incontinence: Secondary | ICD-10-CM | POA: Diagnosis not present

## 2021-06-07 DIAGNOSIS — E7800 Pure hypercholesterolemia, unspecified: Secondary | ICD-10-CM | POA: Diagnosis not present

## 2021-06-14 DIAGNOSIS — M6530 Trigger finger, unspecified finger: Secondary | ICD-10-CM | POA: Diagnosis not present

## 2021-06-14 DIAGNOSIS — M1990 Unspecified osteoarthritis, unspecified site: Secondary | ICD-10-CM | POA: Diagnosis not present

## 2021-06-14 DIAGNOSIS — R5383 Other fatigue: Secondary | ICD-10-CM | POA: Diagnosis not present

## 2021-06-14 DIAGNOSIS — I10 Essential (primary) hypertension: Secondary | ICD-10-CM | POA: Diagnosis not present

## 2021-06-14 DIAGNOSIS — M79643 Pain in unspecified hand: Secondary | ICD-10-CM | POA: Diagnosis not present

## 2021-06-14 DIAGNOSIS — K219 Gastro-esophageal reflux disease without esophagitis: Secondary | ICD-10-CM | POA: Diagnosis not present

## 2021-06-14 DIAGNOSIS — E7800 Pure hypercholesterolemia, unspecified: Secondary | ICD-10-CM | POA: Diagnosis not present

## 2021-06-14 DIAGNOSIS — M810 Age-related osteoporosis without current pathological fracture: Secondary | ICD-10-CM | POA: Diagnosis not present

## 2021-06-14 DIAGNOSIS — E559 Vitamin D deficiency, unspecified: Secondary | ICD-10-CM | POA: Diagnosis not present

## 2021-06-19 DIAGNOSIS — N3941 Urge incontinence: Secondary | ICD-10-CM | POA: Diagnosis not present

## 2021-06-26 DIAGNOSIS — N3020 Other chronic cystitis without hematuria: Secondary | ICD-10-CM | POA: Diagnosis not present

## 2021-06-26 DIAGNOSIS — N3941 Urge incontinence: Secondary | ICD-10-CM | POA: Diagnosis not present

## 2021-07-02 DIAGNOSIS — M810 Age-related osteoporosis without current pathological fracture: Secondary | ICD-10-CM | POA: Diagnosis not present

## 2021-07-12 DIAGNOSIS — N3941 Urge incontinence: Secondary | ICD-10-CM | POA: Diagnosis not present

## 2021-08-02 DIAGNOSIS — N3941 Urge incontinence: Secondary | ICD-10-CM | POA: Diagnosis not present

## 2021-08-23 DIAGNOSIS — N3941 Urge incontinence: Secondary | ICD-10-CM | POA: Diagnosis not present

## 2021-09-06 DIAGNOSIS — H353112 Nonexudative age-related macular degeneration, right eye, intermediate dry stage: Secondary | ICD-10-CM | POA: Diagnosis not present

## 2021-09-06 DIAGNOSIS — H353121 Nonexudative age-related macular degeneration, left eye, early dry stage: Secondary | ICD-10-CM | POA: Diagnosis not present

## 2021-09-13 DIAGNOSIS — N3941 Urge incontinence: Secondary | ICD-10-CM | POA: Diagnosis not present

## 2021-09-26 DIAGNOSIS — N3946 Mixed incontinence: Secondary | ICD-10-CM | POA: Diagnosis not present

## 2021-09-26 DIAGNOSIS — N3020 Other chronic cystitis without hematuria: Secondary | ICD-10-CM | POA: Diagnosis not present

## 2021-10-04 DIAGNOSIS — N3941 Urge incontinence: Secondary | ICD-10-CM | POA: Diagnosis not present

## 2021-10-04 DIAGNOSIS — R350 Frequency of micturition: Secondary | ICD-10-CM | POA: Diagnosis not present

## 2021-10-19 DIAGNOSIS — Z23 Encounter for immunization: Secondary | ICD-10-CM | POA: Diagnosis not present

## 2021-11-03 DIAGNOSIS — Z23 Encounter for immunization: Secondary | ICD-10-CM | POA: Diagnosis not present

## 2021-11-09 DIAGNOSIS — H903 Sensorineural hearing loss, bilateral: Secondary | ICD-10-CM | POA: Insufficient documentation

## 2021-11-16 DIAGNOSIS — N3020 Other chronic cystitis without hematuria: Secondary | ICD-10-CM | POA: Diagnosis not present

## 2021-11-16 DIAGNOSIS — N3946 Mixed incontinence: Secondary | ICD-10-CM | POA: Diagnosis not present

## 2021-12-05 DIAGNOSIS — L821 Other seborrheic keratosis: Secondary | ICD-10-CM | POA: Diagnosis not present

## 2021-12-05 DIAGNOSIS — I788 Other diseases of capillaries: Secondary | ICD-10-CM | POA: Diagnosis not present

## 2021-12-05 DIAGNOSIS — L812 Freckles: Secondary | ICD-10-CM | POA: Diagnosis not present

## 2021-12-05 DIAGNOSIS — L570 Actinic keratosis: Secondary | ICD-10-CM | POA: Diagnosis not present

## 2021-12-05 DIAGNOSIS — Z85828 Personal history of other malignant neoplasm of skin: Secondary | ICD-10-CM | POA: Diagnosis not present

## 2021-12-05 DIAGNOSIS — D1801 Hemangioma of skin and subcutaneous tissue: Secondary | ICD-10-CM | POA: Diagnosis not present

## 2021-12-07 DIAGNOSIS — I10 Essential (primary) hypertension: Secondary | ICD-10-CM | POA: Diagnosis not present

## 2021-12-07 DIAGNOSIS — M810 Age-related osteoporosis without current pathological fracture: Secondary | ICD-10-CM | POA: Diagnosis not present

## 2021-12-07 DIAGNOSIS — R7303 Prediabetes: Secondary | ICD-10-CM | POA: Diagnosis not present

## 2021-12-19 DIAGNOSIS — K219 Gastro-esophageal reflux disease without esophagitis: Secondary | ICD-10-CM | POA: Diagnosis not present

## 2021-12-19 DIAGNOSIS — Z0000 Encounter for general adult medical examination without abnormal findings: Secondary | ICD-10-CM | POA: Diagnosis not present

## 2021-12-19 DIAGNOSIS — N3289 Other specified disorders of bladder: Secondary | ICD-10-CM | POA: Diagnosis not present

## 2021-12-19 DIAGNOSIS — M7061 Trochanteric bursitis, right hip: Secondary | ICD-10-CM | POA: Diagnosis not present

## 2021-12-19 DIAGNOSIS — I700 Atherosclerosis of aorta: Secondary | ICD-10-CM | POA: Diagnosis not present

## 2021-12-19 DIAGNOSIS — Z23 Encounter for immunization: Secondary | ICD-10-CM | POA: Diagnosis not present

## 2021-12-19 DIAGNOSIS — I10 Essential (primary) hypertension: Secondary | ICD-10-CM | POA: Diagnosis not present

## 2021-12-19 DIAGNOSIS — I2510 Atherosclerotic heart disease of native coronary artery without angina pectoris: Secondary | ICD-10-CM | POA: Diagnosis not present

## 2021-12-19 DIAGNOSIS — M810 Age-related osteoporosis without current pathological fracture: Secondary | ICD-10-CM | POA: Diagnosis not present

## 2021-12-19 DIAGNOSIS — E559 Vitamin D deficiency, unspecified: Secondary | ICD-10-CM | POA: Diagnosis not present

## 2021-12-19 DIAGNOSIS — I890 Lymphedema, not elsewhere classified: Secondary | ICD-10-CM | POA: Diagnosis not present

## 2021-12-26 DIAGNOSIS — N3946 Mixed incontinence: Secondary | ICD-10-CM | POA: Diagnosis not present

## 2021-12-26 DIAGNOSIS — N3020 Other chronic cystitis without hematuria: Secondary | ICD-10-CM | POA: Diagnosis not present

## 2021-12-28 DIAGNOSIS — M62838 Other muscle spasm: Secondary | ICD-10-CM | POA: Diagnosis not present

## 2021-12-28 DIAGNOSIS — M6281 Muscle weakness (generalized): Secondary | ICD-10-CM | POA: Diagnosis not present

## 2021-12-28 DIAGNOSIS — N3946 Mixed incontinence: Secondary | ICD-10-CM | POA: Diagnosis not present

## 2021-12-28 DIAGNOSIS — M6289 Other specified disorders of muscle: Secondary | ICD-10-CM | POA: Diagnosis not present

## 2022-01-02 DIAGNOSIS — M8589 Other specified disorders of bone density and structure, multiple sites: Secondary | ICD-10-CM | POA: Diagnosis not present

## 2022-01-02 DIAGNOSIS — M810 Age-related osteoporosis without current pathological fracture: Secondary | ICD-10-CM | POA: Diagnosis not present

## 2022-01-03 ENCOUNTER — Ambulatory Visit: Payer: Medicare Other | Admitting: Cardiovascular Disease

## 2022-01-03 DIAGNOSIS — R5383 Other fatigue: Secondary | ICD-10-CM | POA: Diagnosis not present

## 2022-01-03 DIAGNOSIS — M7070 Other bursitis of hip, unspecified hip: Secondary | ICD-10-CM | POA: Diagnosis not present

## 2022-01-03 DIAGNOSIS — M1990 Unspecified osteoarthritis, unspecified site: Secondary | ICD-10-CM | POA: Diagnosis not present

## 2022-01-03 DIAGNOSIS — E559 Vitamin D deficiency, unspecified: Secondary | ICD-10-CM | POA: Diagnosis not present

## 2022-01-03 DIAGNOSIS — M79643 Pain in unspecified hand: Secondary | ICD-10-CM | POA: Diagnosis not present

## 2022-01-03 DIAGNOSIS — M810 Age-related osteoporosis without current pathological fracture: Secondary | ICD-10-CM | POA: Diagnosis not present

## 2022-01-04 ENCOUNTER — Ambulatory Visit: Payer: Medicare Other | Admitting: Nurse Practitioner

## 2022-01-06 NOTE — Progress Notes (Unsigned)
Office Visit    Patient Name: [name] Date of Encounter: 01/07/2022  PCP:  [name]  Cardiologist:  Jenkins Rouge, MD  Advanced Practice Provider:  No care team member to display Electrophysiologist:  None   HPI    [name] is a 86 y.o. female with a past medical history significant for atypical chest pain, pneumonia, hypertension, hyperlipidemia presents today for follow-up appointment.  She was seen 12/11/2020 and at that time did not have any cardiac complaints.  She was first seen in 2011 in the setting of pneumonia.  TTE with LVH EF 60% Myoview normal, no ischemia.  Calcium score 10/23/2015 was 72, 50 percentile for age and sex.  Follow-up score 01/05/2020 was 273 which is 65th percentile.  On statin with LDL 66.  Benign ROM/lingular nodules thought to be benign and stable since 2014.  Labs followed by Dr. Shelia Media.  7/16 left knee arthroscopy for medial meniscus tear.  I had right TKR 05/04/2019.  Today, she feels good with any chest pain, shortness of breath, swelling her legs, or palpitations.  She states she stays very active since her husband has cancer and she is his primary caregiver.  She has a strong family history of heart disease.  Recently, labs at her primary care revealed an LDL of 80.  Her primary care already increased her Crestor to 20 mg daily and ordered repeat labs.  Blood pressure well-controlled today.  Reports no shortness of breath nor dyspnea on exertion. Reports no chest pain, pressure, or tightness. No edema, orthopnea, PND. Reports no palpitations.   Past Medical History    Past Medical History:  Diagnosis Date   Acne rosacea    Arthritis    Breast cancer ([place])    Has had a prior right right mastectomy and chemotherapy   Cardiomegaly    Normal echocardiogram and cardiac workup by Dr. Johnsie Cancel   Chest pain, unspecified    Hyperlipidemia    Hypertension    Irritable bladder    Lymphedema of  arm    Right, post lumph node infection   Osteopenia    Pneumonia    5 years ago   Polyp of gallbladder    Thyroid nodule    Past Surgical History:  Procedure Laterality Date   ABDOMINAL HYSTERECTOMY     CHOLECYSTECTOMY N/A 05/08/2017   Procedure: LAPAROSCOPIC CHOLECYSTECTOMY WITH INTRAOPERATIVE CHOLANGIOGRAM;  Surgeon: Alphonsa Overall, MD;  Location: [facility];  Service: General;  Laterality: N/A;  ERAS PATHWAY   EYE SURGERY     cataract surgery bilateral   KNEE ARTHROSCOPY Left 08/10/2014   Procedure: LEFT KNEE ARTHROSCOPY WITH MENSCIAL DEBRIDEMENT CONDROPLASTY;  Surgeon: Gaynelle Arabian, MD;  Location: [facility];  Service: Orthopedics;  Laterality: Left;   MASTECTOMY     right breast   RECONSTRUCTION BREAST W/ TRAM FLAP  1997   after right mastectomy   TONSILLECTOMY     TOTAL KNEE ARTHROPLASTY Right 05/04/2019   Procedure: RIGHT TOTAL KNEE ARTHROPLASTY;  Surgeon: Melrose Nakayama, MD;  Location: [facility];  Service: Orthopedics;  Laterality: Right;   VESICOVAGINAL FISTULA CLOSURE W/ TAH      Allergies  Allergies  Allergen Reactions   Sulfonamide Derivatives Anaphylaxis and Swelling    Facial/tongue/throat swelling.   Tobradex [Tobramycin-Dexamethasone] Itching    redness    EKGs/Labs/Other Studies Reviewed:   The following studies were reviewed today:   ADDENDUM REPORT: 01/05/2020 11:02   CLINICAL DATA:  Risk stratification   EXAM: Coronary Calcium Score   TECHNIQUE: The patient was scanned on a Siemens Somatom 64 slice scanner. Axial non-contrast 3 mm slices were carried out through the heart. The data set was analyzed on a dedicated work station and scored using the [place].   FINDINGS: Non-cardiac: See separate report from [facility].   Ascending aorta: Normal diameter 3.6 cm   Pericardium: Normal   Coronary arteries: Calcium noted in LAD and circumflex   IMPRESSION: Coronary calcium score of 273. This was 11 th percentile for age and sex  matched control.   Jenkins Rouge     Electronically Signed   By: Jenkins Rouge M.D.   On: 01/05/2020 11:02    EKG:  EKG is  ordered today.  The ekg ordered today demonstrates NSR, rate 71 bpm with stable first degree AV block  Recent Labs: No results found for requested labs within last 365 days.  Recent Lipid Panel    Component Value Date/Time   CHOL 146 10/16/2020 0822   TRIG 111 10/16/2020 0822   HDL 60 10/16/2020 0822   CHOLHDL 2.4 10/16/2020 0822   CHOLHDL 3.0 07/10/2009 0340   VLDL 32 07/10/2009 0340   LDLCALC 66 10/16/2020 0822    Home Medications   Current Meds  Medication Sig   amLODipine (NORVASC) 5 MG tablet Take 5 mg by mouth at bedtime.   aspirin EC 81 MG tablet Take 81 mg by mouth daily. Swallow whole.   Calcium-Vitamin D (CALTRATE 600 PLUS-VIT D PO) Take 2 tablets by mouth at bedtime.    cephALEXin (KEFLEX) 250 MG capsule Take 250 mg by mouth daily.   clobetasol ointment (TEMOVATE) 4.31 % Apply 1 application topically 2 (two) times daily as needed (for lichen sclerosus).    denosumab (PROLIA) 60 MG/ML SOLN injection Inject 60 mg into the skin every 6 (six) months.    DHA-EPA-Eve Prim Oil-Vit E (EPA/GLA) CAPS Take 2 capsules by mouth at bedtime. HydroEye Softgels Dry Eye Relief   estradiol (ESTRACE) 0.1 MG/GM vaginal cream Place 1 Applicatorful vaginally 3 (three) times a week.   losartan (COZAAR) 100 MG tablet Take 100 mg by mouth at bedtime.    Multiple Vitamins-Minerals (PRESERVISION AREDS 2) CAPS Take 1 capsule by mouth daily.   MYRBETRIQ 50 MG TB24 tablet Take 50 mg by mouth daily.   Omega-3 Fatty Acids (FISH OIL) 1200 MG CPDR Take 1,200 mg by mouth at bedtime.    pantoprazole (PROTONIX) 40 MG tablet Take 40 mg by mouth daily.   rosuvastatin (CRESTOR) 20 MG tablet TAKE 1 TABLET EVERY OTHER DAY ALTERNATING WITH 1/2 TABLET EVERY OTHER DAY. (Patient taking differently: Take 20 mg by mouth daily. TAKE 1 TABLET EVERY OTHER DAY ALTERNATING WITH 1/2 TABLET EVERY  OTHER DAY.)     Review of Systems      All other systems reviewed and are otherwise negative except as noted above.  Physical Exam    VS:  BP 118/64 (BP Location: Left Arm, Patient Position: Sitting, Cuff Size: Normal)   Pulse 71   Ht '5\' 5"'$  (1.651 m)   Wt 170 lb (77.1 kg)   SpO2 94%   BMI 28.29 kg/m  , BMI Body mass index is 28.29 kg/m.  Wt Readings from Last 3 Encounters:  01/07/22 170 lb (77.1 kg)  12/11/20 172 lb (78 kg)  12/13/19 172 lb (78 kg)     GEN: Well nourished, well developed, in no acute distress. HEENT: normal. Neck: Supple,  no JVD, carotid bruits, or masses. Cardiac: RRR, soft systolic murmur, rubs, or gallops. No clubbing, cyanosis, edema.  Radials/PT 2+ and equal bilaterally.  Respiratory:  Respirations regular and unlabored, clear to auscultation bilaterally. GI: Soft, nontender, nondistended. MS: No deformity or atrophy. Skin: Warm and dry, no rash. Neuro:  Strength and sensation are intact. Psych: Normal affect.  Assessment & Plan    CAD -no chest pain or SOB -Continue GDMT: Aspirin 81 mg, Cozaar 100 mg daily, Crestor 20 mg daily, Norvasc 5 mg daily  Hypertension -Blood pressure well-controlled today -Continue current medications  Hyperlipidemia -Recent labs with LDL 80, goal less than 70 -PCP already increased Crestor to 20 mg daily -Repeat lipid panel in 8 weeks (ordered by PCP)  Lung nodules -CT scan every few years for surveillance -Per PCP  First-degree block -Stable on EKG today -Continue current medications, not on a beta-blocker  Murmur -echo update         Disposition: Follow up 1 year with Jenkins Rouge, MD or APP.  Signed, Elgie Collard, PA-C 01/07/2022, 11:28 AM [facility]

## 2022-01-07 ENCOUNTER — Ambulatory Visit: Payer: Medicare Other | Attending: Nurse Practitioner | Admitting: Physician Assistant

## 2022-01-07 ENCOUNTER — Encounter: Payer: Self-pay | Admitting: Physician Assistant

## 2022-01-07 VITALS — BP 118/64 | HR 71 | Ht 65.0 in | Wt 170.0 lb

## 2022-01-07 DIAGNOSIS — I10 Essential (primary) hypertension: Secondary | ICD-10-CM | POA: Insufficient documentation

## 2022-01-07 DIAGNOSIS — R918 Other nonspecific abnormal finding of lung field: Secondary | ICD-10-CM | POA: Insufficient documentation

## 2022-01-07 DIAGNOSIS — I440 Atrioventricular block, first degree: Secondary | ICD-10-CM | POA: Diagnosis not present

## 2022-01-07 DIAGNOSIS — I2510 Atherosclerotic heart disease of native coronary artery without angina pectoris: Secondary | ICD-10-CM | POA: Insufficient documentation

## 2022-01-07 DIAGNOSIS — E785 Hyperlipidemia, unspecified: Secondary | ICD-10-CM | POA: Diagnosis not present

## 2022-01-07 DIAGNOSIS — R011 Cardiac murmur, unspecified: Secondary | ICD-10-CM | POA: Insufficient documentation

## 2022-01-07 NOTE — Patient Instructions (Signed)
Medication Instructions:  Your physician recommends that you continue on your current medications as directed. Please refer to the Current Medication list given to you today.  *If you need a refill on your cardiac medications before your next appointment, please call your pharmacy*   Lab Work: None If you have labs (blood work) drawn today and your tests are completely normal, you will receive your results only by: [place] (if you have MyChart) OR A paper copy in the mail If you have any lab test that is abnormal or we need to change your treatment, we will call you to review the results.   Testing/Procedures: Your physician has requested that you have an echocardiogram. Echocardiography is a painless test that uses sound waves to create images of your heart. It provides your doctor with information about the size and shape of your heart and how well your heart's chambers and valves are working. This procedure takes approximately one hour. There are no restrictions for this procedure. Please do NOT wear cologne, perfume, aftershave, or lotions (deodorant is allowed). Please arrive 15 minutes prior to your appointment time.    Follow-Up: At [facility], you and your health needs are our priority.  As part of our continuing mission to provide you with exceptional heart care, we have created designated Provider Care Teams.  These Care Teams include your primary Cardiologist (physician) and Advanced Practice Providers (APPs -  Physician Assistants and Nurse Practitioners) who all work together to provide you with the care you need, when you need it.  Your next appointment:   1 year(s)  The format for your next appointment:   In Person  Provider:   Jenkins Rouge, MD    Important Information About Sugar

## 2022-01-22 DIAGNOSIS — N3946 Mixed incontinence: Secondary | ICD-10-CM | POA: Diagnosis not present

## 2022-01-22 DIAGNOSIS — R351 Nocturia: Secondary | ICD-10-CM | POA: Diagnosis not present

## 2022-01-22 DIAGNOSIS — R159 Full incontinence of feces: Secondary | ICD-10-CM | POA: Diagnosis not present

## 2022-01-22 DIAGNOSIS — M6281 Muscle weakness (generalized): Secondary | ICD-10-CM | POA: Diagnosis not present

## 2022-01-22 DIAGNOSIS — M6289 Other specified disorders of muscle: Secondary | ICD-10-CM | POA: Diagnosis not present

## 2022-01-22 DIAGNOSIS — M62838 Other muscle spasm: Secondary | ICD-10-CM | POA: Diagnosis not present

## 2022-02-07 ENCOUNTER — Ambulatory Visit (HOSPITAL_COMMUNITY): Payer: Medicare Other | Attending: Physician Assistant

## 2022-02-07 DIAGNOSIS — R011 Cardiac murmur, unspecified: Secondary | ICD-10-CM

## 2022-02-07 LAB — ECHOCARDIOGRAM COMPLETE
AR max vel: 1.42 cm2
AV Area VTI: 1.43 cm2
AV Area mean vel: 1.41 cm2
AV Mean grad: 10.5 mmHg
AV Peak grad: 21.4 mmHg
Ao pk vel: 2.32 m/s
Area-P 1/2: 3.15 cm2
S' Lateral: 2.5 cm

## 2022-02-15 DIAGNOSIS — Z01419 Encounter for gynecological examination (general) (routine) without abnormal findings: Secondary | ICD-10-CM | POA: Diagnosis not present

## 2022-02-15 DIAGNOSIS — L900 Lichen sclerosus et atrophicus: Secondary | ICD-10-CM | POA: Diagnosis not present

## 2022-02-15 DIAGNOSIS — N951 Menopausal and female climacteric states: Secondary | ICD-10-CM | POA: Diagnosis not present

## 2022-02-15 DIAGNOSIS — Z853 Personal history of malignant neoplasm of breast: Secondary | ICD-10-CM | POA: Diagnosis not present

## 2022-02-15 DIAGNOSIS — Z1231 Encounter for screening mammogram for malignant neoplasm of breast: Secondary | ICD-10-CM | POA: Diagnosis not present

## 2022-02-19 DIAGNOSIS — I2510 Atherosclerotic heart disease of native coronary artery without angina pectoris: Secondary | ICD-10-CM | POA: Diagnosis not present

## 2022-03-14 DIAGNOSIS — H52223 Regular astigmatism, bilateral: Secondary | ICD-10-CM | POA: Diagnosis not present

## 2022-03-14 DIAGNOSIS — H35033 Hypertensive retinopathy, bilateral: Secondary | ICD-10-CM | POA: Diagnosis not present

## 2022-03-14 DIAGNOSIS — H353121 Nonexudative age-related macular degeneration, left eye, early dry stage: Secondary | ICD-10-CM | POA: Diagnosis not present

## 2022-03-14 DIAGNOSIS — H524 Presbyopia: Secondary | ICD-10-CM | POA: Diagnosis not present

## 2022-03-14 DIAGNOSIS — H43813 Vitreous degeneration, bilateral: Secondary | ICD-10-CM | POA: Diagnosis not present

## 2022-03-14 DIAGNOSIS — H353112 Nonexudative age-related macular degeneration, right eye, intermediate dry stage: Secondary | ICD-10-CM | POA: Diagnosis not present

## 2022-03-14 DIAGNOSIS — H5213 Myopia, bilateral: Secondary | ICD-10-CM | POA: Diagnosis not present

## 2022-03-28 DIAGNOSIS — N3020 Other chronic cystitis without hematuria: Secondary | ICD-10-CM | POA: Diagnosis not present

## 2022-03-28 DIAGNOSIS — N3946 Mixed incontinence: Secondary | ICD-10-CM | POA: Diagnosis not present

## 2022-07-05 DIAGNOSIS — M810 Age-related osteoporosis without current pathological fracture: Secondary | ICD-10-CM | POA: Diagnosis not present

## 2022-10-04 DIAGNOSIS — Z23 Encounter for immunization: Secondary | ICD-10-CM | POA: Diagnosis not present

## 2022-10-11 DIAGNOSIS — Z23 Encounter for immunization: Secondary | ICD-10-CM | POA: Diagnosis not present

## 2022-10-22 DIAGNOSIS — N3946 Mixed incontinence: Secondary | ICD-10-CM | POA: Diagnosis not present

## 2022-10-22 DIAGNOSIS — R350 Frequency of micturition: Secondary | ICD-10-CM | POA: Diagnosis not present

## 2022-11-04 DIAGNOSIS — N3946 Mixed incontinence: Secondary | ICD-10-CM | POA: Diagnosis not present

## 2022-11-04 DIAGNOSIS — R351 Nocturia: Secondary | ICD-10-CM | POA: Diagnosis not present

## 2022-11-04 DIAGNOSIS — N3020 Other chronic cystitis without hematuria: Secondary | ICD-10-CM | POA: Diagnosis not present

## 2022-11-28 DIAGNOSIS — N3020 Other chronic cystitis without hematuria: Secondary | ICD-10-CM | POA: Diagnosis not present

## 2022-11-28 DIAGNOSIS — N3946 Mixed incontinence: Secondary | ICD-10-CM | POA: Diagnosis not present

## 2022-12-11 DIAGNOSIS — M7061 Trochanteric bursitis, right hip: Secondary | ICD-10-CM | POA: Diagnosis not present

## 2022-12-23 DIAGNOSIS — R7303 Prediabetes: Secondary | ICD-10-CM | POA: Diagnosis not present

## 2022-12-23 DIAGNOSIS — E7800 Pure hypercholesterolemia, unspecified: Secondary | ICD-10-CM | POA: Diagnosis not present

## 2022-12-23 DIAGNOSIS — M810 Age-related osteoporosis without current pathological fracture: Secondary | ICD-10-CM | POA: Diagnosis not present

## 2022-12-23 DIAGNOSIS — I10 Essential (primary) hypertension: Secondary | ICD-10-CM | POA: Diagnosis not present

## 2022-12-26 DIAGNOSIS — Z0000 Encounter for general adult medical examination without abnormal findings: Secondary | ICD-10-CM | POA: Diagnosis not present

## 2022-12-26 DIAGNOSIS — N3289 Other specified disorders of bladder: Secondary | ICD-10-CM | POA: Diagnosis not present

## 2022-12-26 DIAGNOSIS — M810 Age-related osteoporosis without current pathological fracture: Secondary | ICD-10-CM | POA: Diagnosis not present

## 2022-12-26 DIAGNOSIS — I700 Atherosclerosis of aorta: Secondary | ICD-10-CM | POA: Diagnosis not present

## 2022-12-26 DIAGNOSIS — I10 Essential (primary) hypertension: Secondary | ICD-10-CM | POA: Diagnosis not present

## 2022-12-26 DIAGNOSIS — I25118 Atherosclerotic heart disease of native coronary artery with other forms of angina pectoris: Secondary | ICD-10-CM | POA: Diagnosis not present

## 2023-01-08 DIAGNOSIS — Z85828 Personal history of other malignant neoplasm of skin: Secondary | ICD-10-CM | POA: Diagnosis not present

## 2023-01-08 DIAGNOSIS — L812 Freckles: Secondary | ICD-10-CM | POA: Diagnosis not present

## 2023-01-08 DIAGNOSIS — L817 Pigmented purpuric dermatosis: Secondary | ICD-10-CM | POA: Diagnosis not present

## 2023-01-08 DIAGNOSIS — D1801 Hemangioma of skin and subcutaneous tissue: Secondary | ICD-10-CM | POA: Diagnosis not present

## 2023-01-08 DIAGNOSIS — L821 Other seborrheic keratosis: Secondary | ICD-10-CM | POA: Diagnosis not present

## 2023-01-08 DIAGNOSIS — M810 Age-related osteoporosis without current pathological fracture: Secondary | ICD-10-CM | POA: Diagnosis not present

## 2023-01-08 DIAGNOSIS — L820 Inflamed seborrheic keratosis: Secondary | ICD-10-CM | POA: Diagnosis not present

## 2023-01-09 ENCOUNTER — Ambulatory Visit: Payer: Medicare Other | Admitting: Physician Assistant

## 2023-01-29 DIAGNOSIS — N3020 Other chronic cystitis without hematuria: Secondary | ICD-10-CM | POA: Diagnosis not present

## 2023-02-28 NOTE — Progress Notes (Deleted)
 Cardiology Office Note   Date:  02/28/2023   ID:  [name], DOB 1935-08-15, MRN [id number]  PCP:  Merri Brunette, MD  Cardiologist:  Dr. Eden Emms     History of Present Illness:  88 y.o. f/u atypical chest pain. First seen 2011 pain in setting of pneumonia. TTE with LVH EF 60% Myovue normal no ischemia.  Calcium score 10/23/15  Was 74 50th percentile for age and sex. F/U score 01/05/20 273 65 th percentile On statin with LDL 66  Benign RM/lingular nodules thought benign stable since 2014  Labs followed by Dr Renne Crigler. Two drug Rx for HTN  08/10/14 Left knee arthroscopy Dr Despina Hick for medial meniscus tear Now seeing Daldorf Had right TKR 05/04/19   05/08/17 laparoscopic cholecystectomy Dr Ezzard Standing   No cardiac complaints   Husband fell and broke his hip and has a bit of a rehab struggle She is primary care giver  TTE 02/07/22 showed normal EF and mild AS mean gradient only 10.5 mmhg   ***  Past Medical History:  Diagnosis Date   Acne rosacea    Arthritis    Breast cancer (HCC)    Has had a prior right right mastectomy and chemotherapy   Cardiomegaly    Normal echocardiogram and cardiac workup by Dr. Eden Emms   Chest pain, unspecified    Hyperlipidemia    Hypertension    Irritable bladder    Lymphedema of arm    Right, post lumph node infection   Osteopenia    Pneumonia    5 years ago   Polyp of gallbladder    Thyroid nodule     Past Surgical History:  Procedure Laterality Date   ABDOMINAL HYSTERECTOMY     CHOLECYSTECTOMY N/A 05/08/2017   Procedure: LAPAROSCOPIC CHOLECYSTECTOMY WITH INTRAOPERATIVE CHOLANGIOGRAM;  Surgeon: Ovidio Kin, MD;  Location: [facility];  Service: General;  Laterality: N/A;  ERAS PATHWAY   EYE SURGERY     cataract surgery bilateral   KNEE ARTHROSCOPY Left 08/10/2014   Procedure: LEFT KNEE ARTHROSCOPY WITH MENSCIAL DEBRIDEMENT CONDROPLASTY;  Surgeon: Ollen Gross, MD;  Location: [facility];  Service: Orthopedics;  Laterality: Left;   MASTECTOMY     right  breast   RECONSTRUCTION BREAST W/ TRAM FLAP  1997   after right mastectomy   TONSILLECTOMY     TOTAL KNEE ARTHROPLASTY Right 05/04/2019   Procedure: RIGHT TOTAL KNEE ARTHROPLASTY;  Surgeon: Marcene Corning, MD;  Location: [facility];  Service: Orthopedics;  Laterality: Right;   VESICOVAGINAL FISTULA CLOSURE W/ TAH       Current Outpatient Medications  Medication Sig Dispense Refill   amLODipine (NORVASC) 5 MG tablet Take 5 mg by mouth at bedtime.     aspirin EC 81 MG tablet Take 81 mg by mouth daily. Swallow whole.     Calcium-Vitamin D (CALTRATE 600 PLUS-VIT D PO) Take 2 tablets by mouth at bedtime.      cephALEXin (KEFLEX) 250 MG capsule Take 250 mg by mouth daily.     clobetasol ointment (TEMOVATE) 0.05 % Apply 1 application topically 2 (two) times daily as needed (for lichen sclerosus).      denosumab (PROLIA) 60 MG/ML SOLN injection Inject 60 mg into the skin every 6 (six) months.      DHA-EPA-Eve Prim Oil-Vit E (EPA/GLA) CAPS Take 2 capsules by mouth at bedtime. HydroEye Softgels Dry Eye Relief     estradiol (ESTRACE) 0.1 MG/GM vaginal cream Place 1 Applicatorful vaginally 3 (three) times a week.  losartan (COZAAR) 100 MG tablet Take 100 mg by mouth at bedtime.      Multiple Vitamins-Minerals (PRESERVISION AREDS 2) CAPS Take 1 capsule by mouth daily.     MYRBETRIQ 50 MG TB24 tablet Take 50 mg by mouth daily.     Omega-3 Fatty Acids (FISH OIL) 1200 MG CPDR Take 1,200 mg by mouth at bedtime.      pantoprazole (PROTONIX) 40 MG tablet Take 40 mg by mouth daily.     rosuvastatin (CRESTOR) 20 MG tablet TAKE 1 TABLET EVERY OTHER DAY ALTERNATING WITH 1/2 TABLET EVERY OTHER DAY. (Patient taking differently: Take 20 mg by mouth daily. TAKE 1 TABLET EVERY OTHER DAY ALTERNATING WITH 1/2 TABLET EVERY OTHER DAY.) 90 tablet 2   No current facility-administered medications for this visit.    Allergies:   Sulfonamide derivatives and Tobradex [tobramycin-dexamethasone]    Social History:  The  patient  reports that she has never smoked. She has never used smokeless tobacco. She reports that she does not drink alcohol and does not use drugs.   Family History:  The patient's family history includes Congestive Heart Failure in her mother; Coronary artery disease in an other family member; Heart attack (age of onset: 40) in her father; Rheum arthritis in her mother.    ROS:  General:no colds or fevers, no weight changes Skin:no rashes or ulcers HEENT:no blurred vision, no congestion CV:see HPI PUL:see HPI GI:no diarrhea constipation or melena, no indigestion GU:no hematuria, no dysuria MS:no joint pain, no claudication Neuro:no syncope, no lightheadedness Endo:no diabetes, no thyroid disease She has had her flu vaccine  Wt Readings from Last 3 Encounters:  01/07/22 170 lb (77.1 kg)  12/11/20 172 lb (78 kg)  12/13/19 172 lb (78 kg)     PHYSICAL EXAM: VS:  There were no vitals taken for this visit. , BMI There is no height or weight on file to calculate BMI.   Affect appropriate Healthy:  appears stated age HEENT: normal Neck supple with no adenopathy JVP normal no bruits no thyromegaly Lungs clear with no wheezing and good diaphragmatic motion Heart:  S1/S2 2/6 SEM murmur mild AS , no rub, gallop or click PMI normal Abdomen: benighn, BS positve, no tenderness, no AAA no bruit.  No HSM or HJR Distal pulses intact with no bruits No edema Neuro non-focal Skin warm and dry Post right TKR      EKG:   12/02/18 SR rate 72 PR 222 otherwise normal  12/13/19 SR normal ECG  02/28/2023 NSR rate 68 normal    Recent Labs: No results found for requested labs within last 365 days.    Lipid Panel    Component Value Date/Time   CHOL 146 10/16/2020 0822   TRIG 111 10/16/2020 0822   HDL 60 10/16/2020 0822   CHOLHDL 2.4 10/16/2020 0822   CHOLHDL 3.0 07/10/2009 0340   VLDL 32 07/10/2009 0340   LDLCALC 66 10/16/2020 0822       Other studies Reviewed: Additional  studies/ records that were reviewed today include: see above.   ASSESSMENT AND PLAN:  1.  CAD  Relatively low calcium score 4 years ago without angina and on statin.  Calcium score 01/05/20 273 65 th percentile Calcium noted in LAD/LCX No chest pain   2.  HTN Well controlled.  Continue current medications and low sodium Dash type diet.    3.  HLD Crestor  increased to 20 mg by primary for LDL 80   4. Lung nodules RM/lingular  stable over years presumed benign and granulomas by most recent CT 06/30/20   5. Ortho: had left knee arthroscopy in 2016 and right TKR April 2021 f/u Dr Jerl Santos   6. First Degree Block:  AV f/u ecg in a year   7. AS:  mild/moderate by TTE 02/07/22 mean gradient 10.5 peak 21.4 DVI 0.34 and AVA 1.42 cm2 will update TTE for progression  TTE for AS     F/U in a year   [employer]

## 2023-03-05 DIAGNOSIS — N3941 Urge incontinence: Secondary | ICD-10-CM | POA: Diagnosis not present

## 2023-03-05 DIAGNOSIS — R159 Full incontinence of feces: Secondary | ICD-10-CM | POA: Diagnosis not present

## 2023-03-05 DIAGNOSIS — Z79890 Hormone replacement therapy (postmenopausal): Secondary | ICD-10-CM | POA: Diagnosis not present

## 2023-03-05 DIAGNOSIS — L900 Lichen sclerosus et atrophicus: Secondary | ICD-10-CM | POA: Diagnosis not present

## 2023-03-05 DIAGNOSIS — R82998 Other abnormal findings in urine: Secondary | ICD-10-CM | POA: Diagnosis not present

## 2023-03-05 DIAGNOSIS — N951 Menopausal and female climacteric states: Secondary | ICD-10-CM | POA: Diagnosis not present

## 2023-03-05 DIAGNOSIS — Z01419 Encounter for gynecological examination (general) (routine) without abnormal findings: Secondary | ICD-10-CM | POA: Diagnosis not present

## 2023-03-05 DIAGNOSIS — Z1231 Encounter for screening mammogram for malignant neoplasm of breast: Secondary | ICD-10-CM | POA: Diagnosis not present

## 2023-03-05 DIAGNOSIS — Z01411 Encounter for gynecological examination (general) (routine) with abnormal findings: Secondary | ICD-10-CM | POA: Diagnosis not present

## 2023-03-07 ENCOUNTER — Ambulatory Visit: Payer: Medicare Other | Admitting: Cardiovascular Disease

## 2023-03-13 ENCOUNTER — Ambulatory Visit: Payer: Medicare Other | Admitting: Cardiovascular Disease

## 2023-04-08 DIAGNOSIS — N898 Other specified noninflammatory disorders of vagina: Secondary | ICD-10-CM | POA: Diagnosis not present

## 2023-04-08 DIAGNOSIS — R82998 Other abnormal findings in urine: Secondary | ICD-10-CM | POA: Diagnosis not present

## 2023-06-04 ENCOUNTER — Ambulatory Visit
Admission: EM | Admit: 2023-06-04 | Discharge: 2023-06-04 | Disposition: A | Discharge status: Home / Self Care | Attending: Internal Medicine | Admitting: Internal Medicine

## 2023-06-04 DIAGNOSIS — N3000 Acute cystitis without hematuria: Secondary | ICD-10-CM | POA: Diagnosis not present

## 2023-06-04 DIAGNOSIS — Z853 Personal history of malignant neoplasm of breast: Secondary | ICD-10-CM | POA: Insufficient documentation

## 2023-06-04 DIAGNOSIS — L900 Lichen sclerosus et atrophicus: Secondary | ICD-10-CM | POA: Insufficient documentation

## 2023-06-04 DIAGNOSIS — R151 Fecal smearing: Secondary | ICD-10-CM | POA: Insufficient documentation

## 2023-06-04 DIAGNOSIS — R42 Dizziness and giddiness: Secondary | ICD-10-CM | POA: Insufficient documentation

## 2023-06-04 DIAGNOSIS — Z1211 Encounter for screening for malignant neoplasm of colon: Secondary | ICD-10-CM | POA: Insufficient documentation

## 2023-06-04 DIAGNOSIS — R932 Abnormal findings on diagnostic imaging of liver and biliary tract: Secondary | ICD-10-CM | POA: Insufficient documentation

## 2023-06-04 DIAGNOSIS — R1011 Right upper quadrant pain: Secondary | ICD-10-CM | POA: Insufficient documentation

## 2023-06-04 DIAGNOSIS — N941 Unspecified dyspareunia: Secondary | ICD-10-CM | POA: Insufficient documentation

## 2023-06-04 DIAGNOSIS — K8020 Calculus of gallbladder without cholecystitis without obstruction: Secondary | ICD-10-CM | POA: Insufficient documentation

## 2023-06-04 DIAGNOSIS — U071 COVID-19: Secondary | ICD-10-CM | POA: Insufficient documentation

## 2023-06-04 DIAGNOSIS — N390 Urinary tract infection, site not specified: Secondary | ICD-10-CM | POA: Insufficient documentation

## 2023-06-04 DIAGNOSIS — N904 Leukoplakia of vulva: Secondary | ICD-10-CM | POA: Insufficient documentation

## 2023-06-04 DIAGNOSIS — D493 Neoplasm of unspecified behavior of breast: Secondary | ICD-10-CM | POA: Insufficient documentation

## 2023-06-04 DIAGNOSIS — K625 Hemorrhage of anus and rectum: Secondary | ICD-10-CM | POA: Insufficient documentation

## 2023-06-04 DIAGNOSIS — R159 Full incontinence of feces: Secondary | ICD-10-CM | POA: Insufficient documentation

## 2023-06-04 DIAGNOSIS — R350 Frequency of micturition: Secondary | ICD-10-CM | POA: Insufficient documentation

## 2023-06-04 LAB — POCT URINALYSIS DIP (MANUAL ENTRY)
Bilirubin, UA: NEGATIVE
Glucose, UA: NEGATIVE mg/dL
Ketones, POC UA: NEGATIVE mg/dL
Nitrite, UA: NEGATIVE
Protein Ur, POC: NEGATIVE mg/dL
Spec Grav, UA: 1.015 (ref 1.010–1.025)
Urobilinogen, UA: 0.2 U/dL
pH, UA: 7 (ref 5.0–8.0)

## 2023-06-04 MED ORDER — CEPHALEXIN 500 MG PO CAPS: 500.0000 mg | ORAL_CAPSULE | Freq: Four times a day (QID) | ORAL | 0 refills | Status: DC

## 2023-06-04 MED ORDER — CEPHALEXIN 250 MG PO CAPS: 250.0000 mg | ORAL_CAPSULE | Freq: Two times a day (BID) | ORAL | 0 refills | Status: AC

## 2023-06-04 MED ORDER — BENZONATATE 100 MG PO CAPS: 100.0000 mg | ORAL_CAPSULE | Freq: Three times a day (TID) | ORAL | 0 refills | Status: DC

## 2023-06-04 NOTE — ED Triage Notes (Signed)
 Denies Chest Pain and SOB.

## 2023-06-04 NOTE — Discharge Instructions (Addendum)
 You have a viral illness which will improve on its own with rest, fluids, and medications to help with your symptoms (COVID).  Wear a mask in public until day 6 of symptoms (Jun 06, 2023 you may take your mask off). Stay at home if you have a fever to avoid spread of illness to others.  Tylenol , guaifenesin (plain mucinex), and saline nasal sprays may help relieve symptoms.   Two teaspoons of honey in 1 cup of warm water every 4-6 hours may help with throat pains.  Humidifier in room at nighttime may help soothe cough (clean well daily).   For chest pain, shortness of breath, inability to keep food or fluids down without vomiting, fever that does not respond to tylenol  or motrin, or any other severe symptoms, please go to the ER for further evaluation. Return to urgent care as needed, otherwise follow-up with PCP.    Please increase your fluid intake and drink plenty of water.  Take keflex  250mg  twice a day instead of just once a day for the next 7 days to treat suspected acute UTI.  Once you're done taking keflex  250mg  every 12 hours for 7 days, go back to taking just one pill of keflex  daily for prevention.

## 2023-06-04 NOTE — ED Provider Notes (Signed)
 [facility]    CSN: [id number] Arrival date & time: 06/04/23  1125      History   Chief Complaint Chief Complaint  Patient presents with   Dizziness   Cough   Breathing Problem    HPI [name] is a 88 y.o. female.   Patient presents to urgent care with her adult son who contributed to the history for evaluation of cough, congestion, sore throat, and generalized fatigue that started 4 days ago on Sunday may 11th 2025.  Cough is productive with clear phlegm.  Denies shortness of breath, chest pain, heart palpitations, leg swelling, orthopnea, and rash.  She has had a low-grade fever at home up to 100.0.  She took an at home COVID test this morning and it was positive.  Denies history of chronic respiratory problems.  She is taking over-the-counter medications with some relief.  She additionally reports dizziness that started this morning.  Dizziness is worsened upon standing and movement/position changes.  Denies ear pain, tinnitus, and history of vertigo.  She additionally reports decreased urine output that started yesterday.  She takes 250 mg cephalexin  daily for UTI prevention due to chronic UTIs.  Her son states she is very prone to developing acute UTIs.  Denies recent urinary frequency, dysuria, abdominal pain, nausea, vomiting, diarrhea, flank pain, headache, and vision changes.  She admits to decreased water intake.  Denies frequent intake of urinary irritants.  Denies recent falls, confusion, or memory changes.  She would like to be checked for urinary tract infection today.   Dizziness Cough Breathing Problem    Past Medical History:  Diagnosis Date   Acne rosacea    ACNE ROSACEA 09/18/2009   Qualifier: Diagnosis of   By: Nadean August         Acute medial meniscal tear 08/10/2014   Arthritis    Breast cancer [facility])    Has had a prior right right mastectomy and chemotherapy   Cardiomegaly    Normal echocardiogram and cardiac workup by Dr.  Stann Earnest   Chest pain, unspecified    Hyperlipidemia    Hypertension    Irritable bladder    Lymphedema of arm    Right, post lumph node infection   Osteopenia    Pneumonia    5 years ago   Polyp of gallbladder    POLYP, GALLBLADDER 09/18/2009   Qualifier: Diagnosis of   By: Nadean August         Primary osteoarthritis of right knee 05/04/2019   Thyroid  nodule     Patient Active Problem List   Diagnosis Date Noted   Abnormal findings on diagnostic imaging of liver and biliary tract 06/04/2023   Encounter for screening for malignant neoplasm of colon 06/04/2023   Fecal smearing 06/04/2023   Hemorrhage of rectum and anus 06/04/2023   Incontinence of feces 06/04/2023   Increased frequency of urination 06/04/2023   Lichen sclerosus et atrophicus 06/04/2023   Lichen sclerosus et atrophicus of the vulva 06/04/2023   Right upper quadrant pain 06/04/2023   Neoplasm of breast 06/04/2023   Urinary tract infectious disease 06/04/2023   Cholelithiasis 06/04/2023   Personal history of breast cancer 06/04/2023   Lichen sclerosus 06/04/2023   Dyspareunia in female 06/04/2023   Bilateral sensorineural hearing loss 11/09/2021   Pulmonary nodules 04/07/2013   First degree atrioventricular block 08/27/2011   Other specified disorders of bladder 09/18/2009   OSTEOPENIA 09/18/2009   THYROID  NODULE, HX OF 09/18/2009   CHEST PAIN-UNSPECIFIED 07/27/2009  HYPERLIPIDEMIA 07/25/2009   Essential hypertension 07/25/2009    Past Surgical History:  Procedure Laterality Date   ABDOMINAL HYSTERECTOMY     CHOLECYSTECTOMY N/A 05/08/2017   Procedure: LAPAROSCOPIC CHOLECYSTECTOMY WITH INTRAOPERATIVE CHOLANGIOGRAM;  Surgeon: Juanita Norlander, MD;  Location: [facility];  Service: General;  Laterality: N/A;  ERAS PATHWAY   EYE SURGERY     cataract surgery bilateral   KNEE ARTHROSCOPY Left 08/10/2014   Procedure: LEFT KNEE ARTHROSCOPY WITH MENSCIAL DEBRIDEMENT CONDROPLASTY;  Surgeon: Liliane Rei, MD;   Location: [facility];  Service: Orthopedics;  Laterality: Left;   MASTECTOMY     right breast   RECONSTRUCTION BREAST W/ TRAM FLAP  1997   after right mastectomy   TONSILLECTOMY     TOTAL KNEE ARTHROPLASTY Right 05/04/2019   Procedure: RIGHT TOTAL KNEE ARTHROPLASTY;  Surgeon: Dayne Even, MD;  Location: [facility];  Service: Orthopedics;  Laterality: Right;   VESICOVAGINAL FISTULA CLOSURE W/ TAH      OB History   No obstetric history on file.      Home Medications    Prior to Admission medications   Medication Sig Start Date End Date Taking? Authorizing Provider  benzonatate (TESSALON) 100 MG capsule Take 1 capsule (100 mg total) by mouth every 8 (eight) hours. 06/04/23  Yes Starlene Eaton, FNP  cephALEXin  (KEFLEX ) 250 MG capsule Take 1 capsule (250 mg total) by mouth 2 (two) times daily for 7 days. 06/04/23 06/11/23 Yes StanhopeDanny Dye, FNP  amLODipine  (NORVASC ) 5 MG tablet Take 5 mg by mouth at bedtime.    [provider]  aspirin  EC 81 MG tablet Take 81 mg by mouth daily. Swallow whole.    [provider]  Calcium -Vitamin D (CALTRATE 600 PLUS-VIT D PO) Take 2 tablets by mouth at bedtime.     [provider]  cephALEXin  (KEFLEX ) 250 MG capsule Take 250 mg by mouth daily.    [provider]  clobetasol ointment (TEMOVATE) 0.05 % Apply 1 application topically 2 (two) times daily as needed (for lichen sclerosus).     [provider]  denosumab  (PROLIA ) 60 MG/ML SOLN injection Inject 60 mg into the skin every 6 (six) months.     [provider]  DHA-EPA-Eve Prim Oil-Vit E (EPA/GLA) CAPS Take 2 capsules by mouth at bedtime. HydroEye Softgels Dry Eye Relief    [provider]  estradiol (ESTRACE) 0.1 MG/GM vaginal cream Place 1 Applicatorful vaginally 3 (three) times a week.    [provider]  losartan  (COZAAR ) 100 MG tablet Take 100 mg by mouth at bedtime.     [provider]  molnupiravir  EUA  (LAGEVRIO ) 200 MG CAPS capsule Take 4 capsules by mouth 2 (two) times daily.    [provider]  Multiple Vitamins-Minerals (PRESERVISION AREDS 2) CAPS Take 1 capsule by mouth daily.    [provider]  MYRBETRIQ 50 MG TB24 tablet Take 50 mg by mouth daily. 01/03/22   [provider]  Na Sulfate-K Sulfate-Mg Sulfate concentrate (SUPREP) 17.5-3.13-1.6 GM/177ML SOLN Take 1 kit by mouth once.    [provider]  Omega-3 Fatty Acids (FISH OIL) 1200 MG CPDR Take 1,200 mg by mouth at bedtime.     [provider]  pantoprazole (PROTONIX) 40 MG tablet Take 40 mg by mouth daily. 01/03/22   [provider]  rosuvastatin  (CRESTOR ) 20 MG tablet TAKE 1 TABLET EVERY OTHER DAY ALTERNATING WITH 1/2 TABLET EVERY OTHER DAY. Patient taking differently: Take 20 mg by mouth  daily. TAKE 1 TABLET EVERY OTHER DAY ALTERNATING WITH 1/2 TABLET EVERY OTHER DAY. 02/12/21   Nishan, Peter C, MD  Vibegron (GEMTESA) 75 MG TABS Take 1 tablet by mouth daily.    [provider]    Family History Family History  Problem Relation Age of Onset   Rheum arthritis Mother    Congestive Heart Failure Mother    Heart attack Father 30   Coronary artery disease Other     Social History Social History   Tobacco Use   Smoking status: Never   Smokeless tobacco: Never  Vaping Use   Vaping status: Never Used  Substance Use Topics   Alcohol use: No   Drug use: No     Allergies   Sulfonamide derivatives and Tobradex [tobramycin-dexamethasone ]   Review of Systems Review of Systems  Respiratory:  Positive for cough.   Neurological:  Positive for dizziness.     Physical Exam Triage Vital Signs ED Triage Vitals  Encounter Vitals Group     BP 06/04/23 1139 123/71     Systolic BP Percentile --      Diastolic BP Percentile --      Pulse Rate 06/04/23 1139 81     Resp 06/04/23 1139 18     Temp 06/04/23 1139 98.5 F (36.9 C)     Temp Source 06/04/23 1139 Oral      SpO2 06/04/23 1139 96 %     Weight 06/04/23 1136 165 lb (74.8 kg)     Height 06/04/23 1136 5\' 5"  (1.651 m)     Head Circumference --      Peak Flow --      Pain Score 06/04/23 1135 0     Pain Loc --      Pain Education --      Exclude from Growth Chart --    Orthostatic VS for the past 24 hrs:  BP- Lying Pulse- Lying BP- Standing at 0 minutes Pulse- Standing at 0 minutes  06/04/23 1307 130/70 69 120/73 79    Updated Vital Signs BP 123/71 (BP Location: Left Arm)   Pulse 81   Temp 98.5 F (36.9 C) (Oral)   Resp 18   Ht 5\' 5"  (1.651 m)   Wt 165 lb (74.8 kg)   SpO2 96%   BMI 27.46 kg/m   Visual Acuity Right Eye Distance:   Left Eye Distance:   Bilateral Distance:    Right Eye Near:   Left Eye Near:    Bilateral Near:     Physical Exam Vitals and nursing note reviewed.  Constitutional:      Appearance: She is not ill-appearing or toxic-appearing.  HENT:     Head: Normocephalic and atraumatic.     Right Ear: Hearing, tympanic membrane, ear canal and external ear normal.     Left Ear: Hearing, tympanic membrane, ear canal and external ear normal.     Nose: Nose normal.     Mouth/Throat:     Lips: Pink.     Mouth: Mucous membranes are moist. No injury or oral lesions.     Dentition: Normal dentition.     Tongue: No lesions.     Pharynx: Oropharynx is clear. Uvula midline. No pharyngeal swelling, oropharyngeal exudate, posterior oropharyngeal erythema, uvula swelling or postnasal drip.     Tonsils: No tonsillar exudate.  Eyes:     General: Lids are normal. Vision grossly intact. Gaze aligned appropriately.     Extraocular Movements: Extraocular movements intact.  Conjunctiva/sclera: Conjunctivae normal.  Neck:     Trachea: Trachea and phonation normal.  Cardiovascular:     Rate and Rhythm: Normal rate and regular rhythm.     Heart sounds: Normal heart sounds, S1 normal and S2 normal.  Pulmonary:     Effort: Pulmonary effort is normal. No respiratory distress.      Breath sounds: Normal breath sounds and air entry. No wheezing, rhonchi or rales.  Chest:     Chest wall: No tenderness.  Musculoskeletal:     Cervical back: Neck supple.  Lymphadenopathy:     Cervical: No cervical adenopathy.  Skin:    General: Skin is warm and dry.     Capillary Refill: Capillary refill takes less than 2 seconds.     Findings: No rash.  Neurological:     General: No focal deficit present.     Mental Status: She is alert and oriented to person, place, and time. Mental status is at baseline.     GCS: GCS eye subscore is 4. GCS verbal subscore is 5. GCS motor subscore is 6.     Cranial Nerves: Cranial nerves 2-12 are intact. No dysarthria or facial asymmetry.     Sensory: Sensation is intact.     Motor: Motor function is intact. No weakness, tremor, abnormal muscle tone or pronator drift.     Coordination: Coordination is intact. Romberg sign negative. Coordination normal. Finger-Nose-Finger Test normal.     Gait: Gait abnormal (Ambulatory with steady gait with cane assistance).     Comments: Strength and sensation intact to bilateral upper and lower extremities (5/5). Moves all 4 extremities with normal coordination voluntarily. Non-focal neuro exam.   Psychiatric:        Mood and Affect: Mood normal.        Speech: Speech normal.        Behavior: Behavior normal.        Thought Content: Thought content normal.        Judgment: Judgment normal.      UC Treatments / Results  Labs (all labs ordered are listed, but only abnormal results are displayed) Labs Reviewed  POCT URINALYSIS DIP (MANUAL ENTRY) - Abnormal; Notable for the following components:      Result Value   Clarity, UA cloudy (*)    Blood, UA trace-intact (*)    Leukocytes, UA Small (1+) (*)    All other components within normal limits  URINE CULTURE  CBC  COMPREHENSIVE METABOLIC PANEL WITH GFR    EKG   Radiology No results found.  Procedures Procedures (including critical care  time)  Medications Ordered in UC Medications - No data to display  Initial Impression / Assessment and Plan / UC Course  I have reviewed the triage vital signs and the nursing notes.  Pertinent labs & imaging results that were available during my care of the patient were reviewed by me and considered in my medical decision making (see chart for details).   1.  Acute recurrent cystitis, dizziness Urinalysis shows small leukocytes, trace intact blood, and cloudy urine suspicious for acute urinary tract infection versus chronic. I would like for her to increase her cephalexin  250 mg to twice daily for 7 days, then reduce back to once daily after 7 days. Dizziness is likely secondary to urinary tract infection. Low suspicion for pyelonephritis, nephrolithiasis, etc. Encouraged to increase fluid intake to stay well-hydrated. She does not appear to be clinically dehydrated on exam and orthostatic vital signs are unremarkable for  postural hypotension/tachycardia. CBC and CMP drawn today to evaluate for other causes of dizziness. She is neurologically intact to her baseline, low suspicion for acute intracranial abnormality. Recommend follow-up with urology.   2.  COVID-19 COVID testing positive at home. Lungs clear, vitals hemodynamically stable, therefore deferred imaging of the chest.  She is not a candidate for Paxlovid as I am unable to see a recent renal function within the last 6 months to 1 year in her chart.  Last available renal function is from 2022. Molnupiravir  is an option, however we discussed that this medication is very expensive, is not as effective as Paxlovid, and would likely not be covered by insurance. Shared decision making used to defer prescription for antiviral.  Discussed most up to date CDC guidelines regarding quarantine and masking to prevent transmission.  Recommend supportive care for symptomatic relief as outlined in AVS.    Counseled patient on potential for  adverse effects with medications prescribed/recommended today, strict ER and return-to-clinic precautions discussed, patient verbalized understanding.    Final Clinical Impressions(s) / UC Diagnoses   Final diagnoses:  Acute recurrent cystitis  Dizziness  COVID-19     Discharge Instructions      You have a viral illness which will improve on its own with rest, fluids, and medications to help with your symptoms (COVID).  Wear a mask in public until day 6 of symptoms (Jun 06, 2023 you may take your mask off). Stay at home if you have a fever to avoid spread of illness to others.  Tylenol , guaifenesin (plain mucinex), and saline nasal sprays may help relieve symptoms.   Two teaspoons of honey in 1 cup of warm water every 4-6 hours may help with throat pains.  Humidifier in room at nighttime may help soothe cough (clean well daily).   For chest pain, shortness of breath, inability to keep food or fluids down without vomiting, fever that does not respond to tylenol  or motrin, or any other severe symptoms, please go to the ER for further evaluation. Return to urgent care as needed, otherwise follow-up with PCP.    Please increase your fluid intake and drink plenty of water.  Take keflex  250mg  twice a day instead of just once a day for the next 7 days to treat suspected acute UTI.  Once you're done taking keflex  250mg  every 12 hours for 7 days, go back to taking just one pill of keflex  daily for prevention.    ED Prescriptions     Medication Sig Dispense Auth. Provider   benzonatate (TESSALON) 100 MG capsule Take 1 capsule (100 mg total) by mouth every 8 (eight) hours. 21 capsule Shella Devoid M, FNP   cephALEXin  (KEFLEX ) 500 MG capsule  (Status: Discontinued) Take 1 capsule (500 mg total) by mouth 4 (four) times daily. 20 capsule Starlene Eaton, FNP   cephALEXin  (KEFLEX ) 250 MG capsule Take 1 capsule (250 mg total) by mouth 2 (two) times daily for 7 days. 14 capsule  Starlene Eaton, FNP      PDMP not reviewed this encounter.   Starlene Eaton, [place] 06/04/23 1331

## 2023-06-04 NOTE — ED Triage Notes (Signed)
 PCP wanted her to got to Urgent Care to "rule out Pna".

## 2023-06-04 NOTE — ED Triage Notes (Signed)
 Here with Son. "I tested + for COVID19 this morning, this started Sunday with not feeling well, tired, cough, by Monday cough remained with no energy, by Tuesday the same, this morning I got up and dizzy, cough, low grade fever (100.3, last night)". ? Nausea.

## 2023-06-05 ENCOUNTER — Ambulatory Visit (HOSPITAL_COMMUNITY): Payer: Self-pay

## 2023-06-05 LAB — CBC
Hematocrit: 43.8 % (ref 34.0–46.6)
Hemoglobin: 14.3 g/dL (ref 11.1–15.9)
MCH: 29.7 pg (ref 26.6–33.0)
MCHC: 32.6 g/dL (ref 31.5–35.7)
MCV: 91 fL (ref 79–97)
Platelets: 152 10*3/uL (ref 150–450)
RBC: 4.82 x10E6/uL (ref 3.77–5.28)
RDW: 14.1 % (ref 11.7–15.4)
WBC: 3 10*3/uL — ABNORMAL LOW (ref 3.4–10.8)

## 2023-06-05 LAB — COMPREHENSIVE METABOLIC PANEL WITH GFR
ALT: 49 IU/L — ABNORMAL HIGH (ref 0–32)
AST: 47 IU/L — ABNORMAL HIGH (ref 0–40)
Albumin: 4.1 g/dL (ref 3.7–4.7)
Alkaline Phosphatase: 54 IU/L (ref 44–121)
BUN/Creatinine Ratio: 19 (ref 12–28)
BUN: 14 mg/dL (ref 8–27)
Bilirubin Total: 0.6 mg/dL (ref 0.0–1.2)
CO2: 22 mmol/L (ref 20–29)
Calcium: 8.9 mg/dL (ref 8.7–10.3)
Chloride: 104 mmol/L (ref 96–106)
Creatinine, Ser: 0.73 mg/dL (ref 0.57–1.00)
Globulin, Total: 2.2 g/dL (ref 1.5–4.5)
Glucose: 98 mg/dL (ref 70–99)
Potassium: 4.3 mmol/L (ref 3.5–5.2)
Sodium: 140 mmol/L (ref 134–144)
Total Protein: 6.3 g/dL (ref 6.0–8.5)
eGFR: 80 mL/min/{1.73_m2} (ref >59)

## 2023-06-05 LAB — URINE CULTURE

## 2023-06-15 ENCOUNTER — Ambulatory Visit (INDEPENDENT_AMBULATORY_CARE_PROVIDER_SITE_OTHER)

## 2023-06-15 ENCOUNTER — Ambulatory Visit
Admission: EM | Admit: 2023-06-15 | Discharge: 2023-06-15 | Disposition: A | Discharge status: Home / Self Care | Attending: Nurse Practitioner | Admitting: Nurse Practitioner

## 2023-06-15 DIAGNOSIS — J189 Pneumonia, unspecified organism: Secondary | ICD-10-CM | POA: Insufficient documentation

## 2023-06-15 DIAGNOSIS — R051 Acute cough: Secondary | ICD-10-CM | POA: Insufficient documentation

## 2023-06-15 DIAGNOSIS — R059 Cough, unspecified: Secondary | ICD-10-CM | POA: Diagnosis not present

## 2023-06-15 DIAGNOSIS — Z8616 Personal history of COVID-19: Secondary | ICD-10-CM | POA: Diagnosis not present

## 2023-06-15 DIAGNOSIS — R918 Other nonspecific abnormal finding of lung field: Secondary | ICD-10-CM | POA: Diagnosis not present

## 2023-06-15 DIAGNOSIS — R350 Frequency of micturition: Secondary | ICD-10-CM | POA: Diagnosis not present

## 2023-06-15 LAB — POCT URINALYSIS DIP (MANUAL ENTRY)
Bilirubin, UA: NEGATIVE
Glucose, UA: NEGATIVE mg/dL
Ketones, POC UA: NEGATIVE mg/dL
Leukocytes, UA: NEGATIVE
Nitrite, UA: NEGATIVE
Protein Ur, POC: 30 mg/dL — AB
Spec Grav, UA: 1.01 (ref 1.010–1.025)
Urobilinogen, UA: 1 U/dL
pH, UA: 6.5 (ref 5.0–8.0)

## 2023-06-15 MED ORDER — CEFTRIAXONE SODIUM 1 G IJ SOLR
1.0000 g | Freq: Once | INTRAMUSCULAR | Status: AC
  Administered 2023-06-15: 1 g via INTRAMUSCULAR

## 2023-06-15 MED ORDER — AMOXICILLIN-POT CLAVULANATE 875-125 MG PO TABS: 1.0000 | ORAL_TABLET | Freq: Two times a day (BID) | ORAL | 0 refills | Status: AC

## 2023-06-15 MED ORDER — AZITHROMYCIN 500 MG PO TABS: 500.0000 mg | ORAL_TABLET | Freq: Every day | ORAL | 0 refills | Status: AC

## 2023-06-15 MED ORDER — BENZONATATE 100 MG PO CAPS: 100.0000 mg | ORAL_CAPSULE | Freq: Three times a day (TID) | ORAL | 0 refills | Status: AC | PRN

## 2023-06-15 NOTE — ED Triage Notes (Addendum)
"  I was diagnosed with COVID19 & UTI on the 14th (symptoms started the 10th) but not improved, now bad with Fever, Cough, Congestion, Chills, Fatigue". Fever "last night up to 102".

## 2023-06-15 NOTE — ED Provider Notes (Signed)
 [facility]    CSN: [id number] Arrival date & time: 06/15/23  0802      History   Chief Complaint Chief Complaint  Patient presents with   Follow-up    UTI & COVID19    HPI [name] is a 88 y.o. female.   [name] is a 88 y.o. female presents for follow-up of a urinary tract infection and COVID-19 diagnosed on Jun 04, 2023. The patient reports ongoing symptoms including weakness, chills, fever, cough, nasal congestion, and urinary issues. The patient states they have been experiencing chills, particularly in the afternoons. Yesterday, she felt cold almost all day, and her fever spiked to over 102F last night, which improved with Tylenol . The patient describes feeling "real weak" with "no energy whatsoever," and this weakness is generalized throughout their body. She continues to have a cough, though it has improved, and they are occasionally able to produce sputum. The patient denies shortness of breath or wheezing. They report significant nasal congestion and runny nose, for which they have been taking Mucinex.  Regarding urinary symptoms, the patient reports slow and frequent urination but denies pain or burning. She is on Keflex  250 mg daily for chronic UTIs, which was doubled to twice daily for 7 days as instructed on her visit on 06/04/23, before returning to once daily. The patient mentions that Cipro had previously provided immediate relief for their UTI symptoms.  The patient also reports experiencing some nausea and loss of appetite but is trying to maintain fluid intake. She denies vomiting, diarrhea, abdominal pain, lower back pain, or body aches.   The following portions of the patient's history were reviewed and updated as appropriate: allergies, current medications, past family history, past medical history, past social history, past surgical history, and problem list.        Past Medical History:  Diagnosis Date   Acne rosacea    ACNE  ROSACEA 09/18/2009   Qualifier: Diagnosis of   By: Nadean August         Acute medial meniscal tear 08/10/2014   Arthritis    Breast cancer (HCC)    Has had a prior right right mastectomy and chemotherapy   Cardiomegaly    Normal echocardiogram and cardiac workup by Dr. Stann Earnest   Chest pain, unspecified    Hyperlipidemia    Hypertension    Irritable bladder    Lymphedema of arm    Right, post lumph node infection   Osteopenia    Pneumonia    5 years ago   Polyp of gallbladder    POLYP, GALLBLADDER 09/18/2009   Qualifier: Diagnosis of   By: Nadean August         Primary osteoarthritis of right knee 05/04/2019   Thyroid  nodule     Patient Active Problem List   Diagnosis Date Noted   Abnormal findings on diagnostic imaging of liver and biliary tract 06/04/2023   Encounter for screening for malignant neoplasm of colon 06/04/2023   Fecal smearing 06/04/2023   Hemorrhage of rectum and anus 06/04/2023   Incontinence of feces 06/04/2023   Increased frequency of urination 06/04/2023   Lichen sclerosus et atrophicus 06/04/2023   Lichen sclerosus et atrophicus of the vulva 06/04/2023   Right upper quadrant pain 06/04/2023   Neoplasm of breast 06/04/2023   Urinary tract infectious disease 06/04/2023   Cholelithiasis 06/04/2023   Personal history of breast cancer 06/04/2023   Lichen sclerosus 06/04/2023   Dyspareunia in female 06/04/2023   Bilateral  sensorineural hearing loss 11/09/2021   Overactive bladder 01/21/2018   Pulmonary nodules 04/07/2013   First degree atrioventricular block 08/27/2011   Other specified disorders of bladder 09/18/2009   OSTEOPENIA 09/18/2009   THYROID  NODULE, HX OF 09/18/2009   CHEST PAIN-UNSPECIFIED 07/27/2009   HYPERLIPIDEMIA 07/25/2009   Essential hypertension 07/25/2009    Past Surgical History:  Procedure Laterality Date   ABDOMINAL HYSTERECTOMY     CHOLECYSTECTOMY N/A 05/08/2017   Procedure: LAPAROSCOPIC CHOLECYSTECTOMY WITH  INTRAOPERATIVE CHOLANGIOGRAM;  Surgeon: Juanita Norlander, MD;  Location: [facility];  Service: General;  Laterality: N/A;  ERAS PATHWAY   EYE SURGERY     cataract surgery bilateral   KNEE ARTHROSCOPY Left 08/10/2014   Procedure: LEFT KNEE ARTHROSCOPY WITH MENSCIAL DEBRIDEMENT CONDROPLASTY;  Surgeon: Liliane Rei, MD;  Location: [facility];  Service: Orthopedics;  Laterality: Left;   MASTECTOMY     right breast   RECONSTRUCTION BREAST W/ TRAM FLAP  1997   after right mastectomy   TONSILLECTOMY     TOTAL KNEE ARTHROPLASTY Right 05/04/2019   Procedure: RIGHT TOTAL KNEE ARTHROPLASTY;  Surgeon: Dayne Even, MD;  Location: [facility];  Service: Orthopedics;  Laterality: Right;   VESICOVAGINAL FISTULA CLOSURE W/ TAH      OB History   No obstetric history on file.      Home Medications    Prior to Admission medications   Medication Sig Start Date End Date Taking? Authorizing Provider  amLODipine  (NORVASC ) 5 MG tablet Take 5 mg by mouth at bedtime.   Yes [provider]  amoxicillin-clavulanate (AUGMENTIN) 875-125 MG tablet Take 1 tablet by mouth every 12 (twelve) hours. 06/15/23  Yes Maryruth Sol, FNP  aspirin  EC 81 MG tablet Take 81 mg by mouth daily. Swallow whole.   Yes [provider]  azithromycin (ZITHROMAX) 500 MG tablet Take 1 tablet (500 mg total) by mouth daily for 5 days. 06/15/23 06/20/23 Yes Maryruth Sol, FNP  benzonatate  (TESSALON ) 100 MG capsule Take 1 capsule (100 mg total) by mouth 3 (three) times daily as needed for cough. 06/15/23  Yes Maryruth Sol, FNP  Calcium -Vitamin D (CALTRATE 600 PLUS-VIT D PO) Take 2 tablets by mouth at bedtime.     [provider]  cephALEXin  (KEFLEX ) 250 MG capsule Take 250 mg by mouth daily.    [provider]  clobetasol ointment (TEMOVATE) 0.05 % Apply 1 application topically 2 (two) times daily as needed (for lichen sclerosus).     [provider]  denosumab  (PROLIA ) 60 MG/ML SOLN injection Inject 60  mg into the skin every 6 (six) months.     [provider]  DHA-EPA-Eve Prim Oil-Vit E (EPA/GLA) CAPS Take 2 capsules by mouth at bedtime. HydroEye Softgels Dry Eye Relief    [provider]  estradiol (ESTRACE) 0.1 MG/GM vaginal cream Place 1 Applicatorful vaginally 3 (three) times a week.    [provider]  losartan  (COZAAR ) 100 MG tablet Take 100 mg by mouth at bedtime.     [provider]  molnupiravir  EUA (LAGEVRIO ) 200 MG CAPS capsule Take 4 capsules by mouth 2 (two) times daily.    [provider]  Multiple Vitamins-Minerals (PRESERVISION AREDS 2) CAPS Take 1 capsule by mouth daily.    [provider]  MYRBETRIQ 50 MG TB24 tablet Take 50 mg by mouth daily. 01/03/22   [provider]  Na Sulfate-K Sulfate-Mg Sulfate concentrate (SUPREP) 17.5-3.13-1.6 GM/177ML SOLN Take 1 kit by mouth once.    [provider]  Omega-3 Fatty Acids (FISH OIL) 1200 MG CPDR Take 1,200 mg by mouth at bedtime.     [provider]  pantoprazole (PROTONIX) 40 MG tablet Take 40 mg by mouth daily. 01/03/22   [provider]  rosuvastatin  (CRESTOR ) 20 MG tablet TAKE 1 TABLET EVERY OTHER DAY ALTERNATING WITH 1/2 TABLET EVERY OTHER DAY. Patient taking differently: Take 20 mg by mouth daily. TAKE 1 TABLET EVERY OTHER DAY ALTERNATING WITH 1/2 TABLET EVERY OTHER DAY. 02/12/21   Nishan, Peter C, MD  Vibegron (GEMTESA) 75 MG TABS Take 1 tablet by mouth daily.    [provider]    Family History Family History  Problem Relation Age of Onset   Rheum arthritis Mother    Congestive Heart Failure Mother    Heart attack Father 56   Coronary artery disease Other     Social History Social History   Tobacco Use   Smoking status: Never   Smokeless tobacco: Never  Vaping Use   Vaping status: Never Used  Substance Use Topics   Alcohol use: No   Drug use: No     Allergies   Sulfacetamide sodium-sulfur, Ramipril,  Sulfonamide derivatives, and Tobradex [tobramycin-dexamethasone ]   Review of Systems Review of Systems  Constitutional:  Positive for activity change, appetite change (decreased), chills, fatigue and fever (TMAX 102).  HENT:  Positive for congestion and rhinorrhea. Negative for ear pain, sneezing and sore throat.   Respiratory:  Positive for cough (improved but still present; occasionally productive). Negative for shortness of breath and wheezing.   Gastrointestinal:  Positive for nausea (mild). Negative for abdominal pain, diarrhea and vomiting.  Genitourinary:  Positive for decreased urine volume and frequency. Negative for dysuria.  Musculoskeletal:  Negative for back pain and myalgias.  Neurological:  Positive for weakness (generalized).  All other systems reviewed and are negative.    Physical Exam Triage Vital Signs ED Triage Vitals  Encounter Vitals Group     BP 06/15/23 0824 110/65     Systolic BP Percentile --      Diastolic BP Percentile --      Pulse Rate 06/15/23 0824 87     Resp 06/15/23 0824 20     Temp 06/15/23 0824 98.3 F (36.8 C)     Temp Source 06/15/23 0824 Oral     SpO2 06/15/23 0824 94 %     Weight 06/15/23 0823 164 lb 14.5 oz (74.8 kg)     Height 06/15/23 0823 5\' 5"  (1.651 m)     Head Circumference --      Peak Flow --      Pain Score 06/15/23 0822 0     Pain Loc --      Pain Education --      Exclude from Growth Chart --    No data found.  Updated Vital Signs BP 110/65 (BP Location: Left Arm)   Pulse 87   Temp 98.3 F (36.8 C) (Oral)   Resp 20   Ht 5\' 5"  (1.651 m)   Wt 164 lb 14.5 oz (74.8 kg)   SpO2 94%   BMI 27.44 kg/m   Visual Acuity Right Eye Distance:   Left Eye Distance:   Bilateral Distance:    Right Eye Near:   Left Eye Near:    Bilateral Near:     Physical Exam   UC Treatments / Results  Labs (all labs ordered are listed, but only abnormal results are displayed) Labs Reviewed  POCT URINALYSIS DIP (MANUAL ENTRY) -  Abnormal; Notable for the following components:      Result Value   Clarity, UA cloudy (*)    Blood, UA trace-intact (*)    Protein Ur, POC =30 (*)    All other components within normal limits  URINE CULTURE    EKG   Radiology DG Chest 2 View Result Date: 06/15/2023 CLINICAL DATA:  Cough.  Recent COVID EXAM: CHEST - 2 VIEW COMPARISON:  02/22/2021 FINDINGS: Faint reticulation at the lung bases, greater on the left. No edema, effusion, or pneumothorax. Normal heart size and mediastinal contours. Postoperative right axilla. Normal heart size and mediastinal contours. IMPRESSION: Asymmetric reticulation at the left base could reflect pneumonia or asymmetric interstitial lung disease. Electronically Signed   By: Ronnette Coke M.D.   On: 06/15/2023 09:12    Procedures Procedures (including critical care time)  Medications Ordered in UC Medications  cefTRIAXone (ROCEPHIN) injection 1 g (has no administration in time range)    Initial Impression / Assessment and Plan / UC Course  I have reviewed the triage vital signs and the nursing notes.  Pertinent labs & imaging results that were available during my care of the patient were reviewed by me and considered in my medical decision making (see chart for details).  Clinical Course as of 06/15/23 1610  Sun Jun 15, 2023  0911 DG Chest 2 View [SM]    Clinical Course User Index [SM] Maryruth Sol, FNP    88 year old female presents for follow-up of COVID-19 and UTI diagnosed on 06/04/2023, with ongoing weakness, chills, intermittent fever, nasal congestion, and urinary symptoms. She remains afebrile today and is nontoxic, though she reports persistent fatigue and a recent fever spike to 102F that responded to Tylenol . Exam reveals diminished breath sounds at the bases but no respiratory distress. Cough is improved and occasionally productive. Urinalysis shows cloudy urine with trace RBCs, no nitrites or leukocytes. CXR shows left basilar  reticulation concerning for pneumonia versus chronic interstitial changes. Urine culture from 5/14 showed mixed flora with recommendation for recollection. Labs from 5/14 showed leukopenia and mildly elevated LFTs, with normal renal function. Given clinical history, physical findings, and recent lab data, patient is treated empirically for recurrent UTI and pneumonia. Rocephin 1 g IM administered in clinic; azithromycin and Augmentin prescribed. Patient instructed to hold Keflex  while on new antibiotics. Repeat CMP recommended in 7-10 days to monitor LFTs. ED precautions and follow-up with PCP were thoroughly reviewed.  Today's evaluation has revealed no signs of a dangerous process. Discussed diagnosis with patient and/or guardian. Patient and/or guardian aware of their diagnosis, possible red flag symptoms to watch out for and need for close follow up. Patient and/or guardian understands verbal and written discharge instructions. Patient and/or guardian comfortable with plan and disposition.  Patient and/or guardian has a clear mental status at this time, good insight into illness (after discussion and teaching) and has clear judgment to make decisions regarding their care  Documentation was completed with the aid of voice recognition software. Transcription may contain typographical errors.  Final Clinical Impressions(s) / UC Diagnoses   Final diagnoses:  Urinary frequency  Acute cough  Community acquired pneumonia, unspecified laterality     Discharge Instructions      You were seen today for continued symptoms following a recent urinary tract infection and COVID-19. You reported feeling weak, experiencing chills, fever, congestion, and changes in urination. Based on your exam, lab work, and past imaging, we are treating you today for a possible lung infection (pneumonia) and  a recurrent urinary tract infection.  You received a Rocephin injection in the clinic to begin treatment. You were  also prescribed two oral antibiotics--azithromycin and Augmentin--which you should take as directed. Please stop taking your daily Keflex  while you are on these new antibiotics. It is important that you complete the full course of both medications, even if you start to feel better.You may resume your daily Keflex  after completing the antibiotics.   Continue to rest and stay well hydrated. Try to eat small meals or snacks if your appetite is low. You may continue taking Mucinex if it helps with your congestion. You were also prescribed benzonatate  to take for cough as well. You should follow up with your primary care provider in 7 to 10 days for repeat bloodwork to monitor your liver function, which was slightly elevated on your last set of labs.  If you develop any worsening symptoms--such as high fever that does not come down with medication, severe weakness, trouble breathing, chest pain, confusion, or if you are unable to keep down fluids--go to the emergency department right away.    ED Prescriptions     Medication Sig Dispense Auth. Provider   amoxicillin-clavulanate (AUGMENTIN) 875-125 MG tablet Take 1 tablet by mouth every 12 (twelve) hours. 14 tablet Maryruth Sol, FNP   azithromycin (ZITHROMAX) 500 MG tablet Take 1 tablet (500 mg total) by mouth daily for 5 days. 5 tablet Maryruth Sol, FNP   benzonatate  (TESSALON ) 100 MG capsule Take 1 capsule (100 mg total) by mouth 3 (three) times daily as needed for cough. 21 capsule Maryruth Sol, FNP      PDMP not reviewed this encounter.   Beola Brazil [place], [place] 06/15/23 [phone]

## 2023-06-15 NOTE — Discharge Instructions (Addendum)
 You were seen today for continued symptoms following a recent urinary tract infection and COVID-19. You reported feeling weak, experiencing chills, fever, congestion, and changes in urination. Based on your exam, lab work, and past imaging, we are treating you today for a possible lung infection (pneumonia) and a recurrent urinary tract infection.  You received a Rocephin injection in the clinic to begin treatment. You were also prescribed two oral antibiotics--azithromycin and Augmentin--which you should take as directed. Please stop taking your daily Keflex  while you are on these new antibiotics. It is important that you complete the full course of both medications, even if you start to feel better.You may resume your daily Keflex  after completing the antibiotics.   Continue to rest and stay well hydrated. Try to eat small meals or snacks if your appetite is low. You may continue taking Mucinex if it helps with your congestion. You were also prescribed benzonatate  to take for cough as well. You should follow up with your primary care provider in 7 to 10 days for repeat bloodwork to monitor your liver function, which was slightly elevated on your last set of labs.  If you develop any worsening symptoms--such as high fever that does not come down with medication, severe weakness, trouble breathing, chest pain, confusion, or if you are unable to keep down fluids--go to the emergency department right away.

## 2023-06-16 LAB — URINE CULTURE: Culture: NO GROWTH

## 2023-06-17 ENCOUNTER — Emergency Department (HOSPITAL_COMMUNITY)
Admission: EM | Admit: 2023-06-17 | Discharge: 2023-06-17 | Disposition: A | Discharge status: Home / Self Care | Attending: Emergency Medicine | Admitting: Emergency Medicine

## 2023-06-17 ENCOUNTER — Other Ambulatory Visit: Payer: Self-pay

## 2023-06-17 ENCOUNTER — Emergency Department (HOSPITAL_COMMUNITY)

## 2023-06-17 ENCOUNTER — Encounter (HOSPITAL_COMMUNITY): Payer: Self-pay

## 2023-06-17 ENCOUNTER — Ambulatory Visit: Payer: Self-pay

## 2023-06-17 DIAGNOSIS — R531 Weakness: Secondary | ICD-10-CM | POA: Diagnosis present

## 2023-06-17 DIAGNOSIS — Z7982 Long term (current) use of aspirin: Secondary | ICD-10-CM | POA: Diagnosis not present

## 2023-06-17 DIAGNOSIS — R42 Dizziness and giddiness: Secondary | ICD-10-CM | POA: Diagnosis not present

## 2023-06-17 DIAGNOSIS — N390 Urinary tract infection, site not specified: Secondary | ICD-10-CM | POA: Insufficient documentation

## 2023-06-17 DIAGNOSIS — I10 Essential (primary) hypertension: Secondary | ICD-10-CM | POA: Diagnosis not present

## 2023-06-17 DIAGNOSIS — J189 Pneumonia, unspecified organism: Secondary | ICD-10-CM | POA: Diagnosis not present

## 2023-06-17 DIAGNOSIS — U071 COVID-19: Secondary | ICD-10-CM | POA: Diagnosis not present

## 2023-06-17 DIAGNOSIS — R0602 Shortness of breath: Secondary | ICD-10-CM | POA: Diagnosis not present

## 2023-06-17 DIAGNOSIS — J1282 Pneumonia due to coronavirus disease 2019: Secondary | ICD-10-CM | POA: Diagnosis present

## 2023-06-17 DIAGNOSIS — R918 Other nonspecific abnormal finding of lung field: Secondary | ICD-10-CM | POA: Diagnosis not present

## 2023-06-17 DIAGNOSIS — R0902 Hypoxemia: Secondary | ICD-10-CM | POA: Diagnosis not present

## 2023-06-17 LAB — CBC WITH DIFFERENTIAL/PLATELET
Abs Immature Granulocytes: 0.02 10*3/uL (ref 0.00–0.07)
Basophils Absolute: 0 10*3/uL (ref 0.0–0.1)
Basophils Relative: 1 %
Eosinophils Absolute: 0 10*3/uL (ref 0.0–0.5)
Eosinophils Relative: 0 %
HCT: 37.1 % (ref 36.0–46.0)
Hemoglobin: 12.1 g/dL (ref 12.0–15.0)
Immature Granulocytes: 1 %
Lymphocytes Relative: 58 %
Lymphs Abs: 2.4 10*3/uL (ref 0.7–4.0)
MCH: 29 pg (ref 26.0–34.0)
MCHC: 32.6 g/dL (ref 30.0–36.0)
MCV: 89 fL (ref 80.0–100.0)
Monocytes Absolute: 0.5 10*3/uL (ref 0.1–1.0)
Monocytes Relative: 13 %
Neutro Abs: 1.1 10*3/uL — ABNORMAL LOW (ref 1.7–7.7)
Neutrophils Relative %: 27 %
Platelets: 171 10*3/uL (ref 150–400)
RBC: 4.17 MIL/uL (ref 3.87–5.11)
RDW: 16.2 % — ABNORMAL HIGH (ref 11.5–15.5)
WBC: 4.1 10*3/uL (ref 4.0–10.5)
nRBC: 0 % (ref 0.0–0.2)

## 2023-06-17 LAB — COMPREHENSIVE METABOLIC PANEL WITH GFR
ALT: 49 U/L — ABNORMAL HIGH (ref 0–44)
AST: 42 U/L — ABNORMAL HIGH (ref 15–41)
Albumin: 2.9 g/dL — ABNORMAL LOW (ref 3.5–5.0)
Alkaline Phosphatase: 53 U/L (ref 38–126)
Anion gap: 8 (ref 5–15)
BUN: 7 mg/dL — ABNORMAL LOW (ref 8–23)
CO2: 23 mmol/L (ref 22–32)
Calcium: 8.1 mg/dL — ABNORMAL LOW (ref 8.9–10.3)
Chloride: 106 mmol/L (ref 98–111)
Creatinine, Ser: 0.66 mg/dL (ref 0.44–1.00)
GFR, Estimated: >60 mL/min (ref >60)
Glucose, Bld: 108 mg/dL — ABNORMAL HIGH (ref 70–99)
Potassium: 3.5 mmol/L (ref 3.5–5.1)
Sodium: 137 mmol/L (ref 135–145)
Total Bilirubin: 0.9 mg/dL (ref 0.0–1.2)
Total Protein: 6.2 g/dL — ABNORMAL LOW (ref 6.5–8.1)

## 2023-06-17 LAB — URINALYSIS, W/ REFLEX TO CULTURE (INFECTION SUSPECTED)
Bacteria, UA: NONE SEEN
Bilirubin Urine: NEGATIVE
Glucose, UA: NEGATIVE mg/dL
Hgb urine dipstick: NEGATIVE
Ketones, ur: NEGATIVE mg/dL
Leukocytes,Ua: NEGATIVE
Nitrite: NEGATIVE
Protein, ur: NEGATIVE mg/dL
Specific Gravity, Urine: 1.003 — ABNORMAL LOW (ref 1.005–1.030)
pH: 7 (ref 5.0–8.0)

## 2023-06-17 MED ORDER — SODIUM CHLORIDE 0.9 % IV SOLN: INTRAVENOUS | Status: DC

## 2023-06-17 MED ORDER — SODIUM CHLORIDE 0.9 % IV BOLUS
1000.0000 mL | Freq: Once | INTRAVENOUS | Status: AC
  Administered 2023-06-17: 1000 mL via INTRAVENOUS

## 2023-06-17 NOTE — ED Notes (Signed)
 Pt ambulated to and from BR with minimal assistance. Pt able to provide urine specimen without in and out cath.

## 2023-06-17 NOTE — ED Provider Notes (Signed)
 [place] [facility] AT [facility] Provider Note   CSN: [id number] Arrival date & time: 06/17/23  4098     History  Chief Complaint  Patient presents with   Weakness    [name] is a 88 y.o. female.  This is a 88 year old female presents with increased weakness.  Recently diagnosed with COVID 11 days ago.  Also subsequently diagnosed with a UTI as well as her having pneumonia.  Seen in urgent care 2 days ago and placed on Augmentin and Zithromax.  Patient states since that time she has noted increased weakness as well as shortness of breath.  Denies any flank pain.  States that she has had 1 episode of diarrhea that started after she took her Augmentin.  Denies any abdominal discomfort.  Called EMS due to not feeling better       Home Medications Prior to Admission medications   Medication Sig Start Date End Date Taking? Authorizing Provider  amLODipine  (NORVASC ) 5 MG tablet Take 5 mg by mouth at bedtime.    [provider]  amoxicillin-clavulanate (AUGMENTIN) 875-125 MG tablet Take 1 tablet by mouth every 12 (twelve) hours. 06/15/23   Maryruth Sol, FNP  aspirin  EC 81 MG tablet Take 81 mg by mouth daily. Swallow whole.    [provider]  azithromycin (ZITHROMAX) 500 MG tablet Take 1 tablet (500 mg total) by mouth daily for 5 days. 06/15/23 06/20/23  Murrill, Samantha, FNP  benzonatate  (TESSALON ) 100 MG capsule Take 1 capsule (100 mg total) by mouth 3 (three) times daily as needed for cough. 06/15/23   Maryruth Sol, FNP  Calcium -Vitamin D (CALTRATE 600 PLUS-VIT D PO) Take 2 tablets by mouth at bedtime.     [provider]  cephALEXin  (KEFLEX ) 250 MG capsule Take 250 mg by mouth daily.    [provider]  clobetasol ointment (TEMOVATE) 0.05 % Apply 1 application topically 2 (two) times daily as needed (for lichen sclerosus).     [provider]  denosumab  (PROLIA ) 60 MG/ML SOLN injection Inject 60 mg  into the skin every 6 (six) months.     [provider]  DHA-EPA-Eve Prim Oil-Vit E (EPA/GLA) CAPS Take 2 capsules by mouth at bedtime. HydroEye Softgels Dry Eye Relief    [provider]  estradiol (ESTRACE) 0.1 MG/GM vaginal cream Place 1 Applicatorful vaginally 3 (three) times a week.    [provider]  losartan  (COZAAR ) 100 MG tablet Take 100 mg by mouth at bedtime.     [provider]  molnupiravir  EUA (LAGEVRIO ) 200 MG CAPS capsule Take 4 capsules by mouth 2 (two) times daily.    [provider]  Multiple Vitamins-Minerals (PRESERVISION AREDS 2) CAPS Take 1 capsule by mouth daily.    [provider]  MYRBETRIQ 50 MG TB24 tablet Take 50 mg by mouth daily. 01/03/22   [provider]  Na Sulfate-K Sulfate-Mg Sulfate concentrate (SUPREP) 17.5-3.13-1.6 GM/177ML SOLN Take 1 kit by mouth once.    [provider]  Omega-3 Fatty Acids (FISH OIL) 1200 MG CPDR Take 1,200 mg by mouth at bedtime.     [provider]  pantoprazole (PROTONIX) 40 MG tablet Take 40 mg by mouth daily. 01/03/22   [provider]  rosuvastatin  (CRESTOR ) 20 MG tablet TAKE 1 TABLET EVERY OTHER DAY ALTERNATING WITH 1/2 TABLET EVERY OTHER DAY. Patient taking differently: Take 20 mg by mouth daily. TAKE 1 TABLET EVERY OTHER DAY ALTERNATING WITH 1/2 TABLET EVERY OTHER  DAY. 02/12/21   Nishan, Peter C, MD  Vibegron (GEMTESA) 75 MG TABS Take 1 tablet by mouth daily.    [provider]      Allergies    Sulfacetamide sodium-sulfur, Ramipril, Sulfonamide derivatives, and Tobradex [tobramycin-dexamethasone ]    Review of Systems   Review of Systems  All other systems reviewed and are negative.   Physical Exam Updated Vital Signs BP (!) 124/94 (BP Location: Left Arm)   Pulse 87   Temp 98.3 F (36.8 C) (Oral)   Resp 20   SpO2 93%  Physical Exam Vitals and nursing note reviewed.  Constitutional:      General: She is not in acute  distress.    Appearance: Normal appearance. She is well-developed. She is not toxic-appearing.  HENT:     Head: Normocephalic and atraumatic.  Eyes:     General: Lids are normal.     Conjunctiva/sclera: Conjunctivae normal.     Pupils: Pupils are equal, round, and reactive to light.  Neck:     Thyroid : No thyroid  mass.     Trachea: No tracheal deviation.  Cardiovascular:     Rate and Rhythm: Normal rate and regular rhythm.     Heart sounds: Normal heart sounds. No murmur heard.    No gallop.  Pulmonary:     Effort: Pulmonary effort is normal. No respiratory distress.     Breath sounds: Normal breath sounds. No stridor. No decreased breath sounds, wheezing, rhonchi or rales.  Abdominal:     General: There is no distension.     Palpations: Abdomen is soft.     Tenderness: There is no abdominal tenderness. There is no rebound.  Musculoskeletal:        General: No tenderness. Normal range of motion.     Cervical back: Normal range of motion and neck supple.  Skin:    General: Skin is warm and dry.     Findings: No abrasion or rash.  Neurological:     Mental Status: She is alert and oriented to person, place, and time. Mental status is at baseline.     GCS: GCS eye subscore is 4. GCS verbal subscore is 5. GCS motor subscore is 6.     Cranial Nerves: No cranial nerve deficit.     Sensory: No sensory deficit.     Motor: Motor function is intact.  Psychiatric:        Attention and Perception: Attention normal.        Speech: Speech normal.        Behavior: Behavior normal.     ED Results / Procedures / Treatments   Labs (all labs ordered are listed, but only abnormal results are displayed) Labs Reviewed  URINALYSIS, W/ REFLEX TO CULTURE (INFECTION SUSPECTED)  CBC WITH DIFFERENTIAL/PLATELET  COMPREHENSIVE METABOLIC PANEL WITH GFR    EKG EKG Interpretation Date/Time:  Tuesday Jun 17 2023 07:57:14 EDT Ventricular Rate:  81 PR Interval:  247 QRS Duration:  78 QT  Interval:  337 QTC Calculation: 392 R Axis:   79  Text Interpretation: Sinus rhythm Prolonged PR interval Confirmed by Lind Repine ([id number]) on 06/17/2023 9:08:55 AM  Radiology DG Chest 2 View Result Date: 06/15/2023 CLINICAL DATA:  Cough.  Recent COVID EXAM: CHEST - 2 VIEW COMPARISON:  02/22/2021 FINDINGS: Faint reticulation at the lung bases, greater on the left. No edema, effusion, or pneumothorax. Normal heart size and mediastinal contours. Postoperative right axilla. Normal heart size and mediastinal contours. IMPRESSION: Asymmetric reticulation at the  left base could reflect pneumonia or asymmetric interstitial lung disease. Electronically Signed   By: Ronnette Coke M.D.   On: 06/15/2023 09:12    Procedures Procedures    Medications Ordered in ED Medications  0.9 %  sodium chloride  infusion (has no administration in time range)  sodium chloride  0.9 % bolus 1,000 mL (has no administration in time range)    ED Course/ Medical Decision Making/ A&P                                 Medical Decision Making Amount and/or Complexity of Data Reviewed Labs: ordered. Radiology: ordered. ECG/medicine tests: ordered.  Risk Prescription drug management.   Patient with EKG that shows normal sinus rhythm.  No ischemic changes noted.  Patient's chest x-ray showed no significant infiltrate.  This was followed by a CAT scan of her chest which showed no infiltrate.  Her urine is clear at this time.  She has no white count on her CBC.  Patient feels better at this time.  Family at bedside and results discussed with them.  She will be discharged home        Final Clinical Impression(s) / ED Diagnoses Final diagnoses:  None    Rx / DC Orders ED Discharge Orders     None         Lind Repine, MD 06/17/23 1342

## 2023-06-17 NOTE — ED Triage Notes (Addendum)
 Pt BIBA from home for increasing weakness, dizziness, nausea and diarrhea.  EMS reports pt had COVID around mother's days that developed into pneumonia over this past weekend, currently taking amoxicillin .    Vitals within range for EMS

## 2023-06-20 DIAGNOSIS — N3020 Other chronic cystitis without hematuria: Secondary | ICD-10-CM | POA: Diagnosis not present

## 2023-06-20 DIAGNOSIS — J1282 Pneumonia due to coronavirus disease 2019: Secondary | ICD-10-CM | POA: Diagnosis not present

## 2023-06-20 DIAGNOSIS — R197 Diarrhea, unspecified: Secondary | ICD-10-CM | POA: Diagnosis not present

## 2023-06-20 DIAGNOSIS — Z09 Encounter for follow-up examination after completed treatment for conditions other than malignant neoplasm: Secondary | ICD-10-CM | POA: Diagnosis not present

## 2023-06-20 DIAGNOSIS — U071 COVID-19: Secondary | ICD-10-CM | POA: Diagnosis not present

## 2023-06-23 DIAGNOSIS — R197 Diarrhea, unspecified: Secondary | ICD-10-CM | POA: Diagnosis not present

## 2023-06-24 NOTE — Progress Notes (Signed)
 Office Visit    Patient Name: [name] Date of Encounter: 07/04/2023  PCP:  Imelda Man, MD   [facility]  Cardiologist:  Janelle Mediate, MD  Advanced Practice Provider:  No care team member to display Electrophysiologist:  None   HPI    MATHILDA Porter is a 88 y.o. female with a past medical history significant for atypical chest pain, pneumonia, hypertension, hyperlipidemia presents today for follow-up appointment.  She was first seen in 2011 in the setting of pneumonia.  TTE with LVH EF 60% Myoview normal, no ischemia.  Calcium  score 10/23/2015 was 72, 50 percentile for age and sex.  Follow-up score 01/05/2020 was 273 which is 65th percentile.  On statin with LDL 80 crestor  increased to 20 mg .  Benign ROM/lingular nodules thought to be benign and stable since 2014.  Labs followed by Dr. Schuyler Custard.  7/16 left knee arthroscopy for medial meniscus tear.  I had right TKR 05/04/2019.  Reports no shortness of breath nor dyspnea on exertion. Reports no chest pain, pressure, or tightness. No edema, orthopnea, PND. Reports no palpitations.   Echo done 02/07/22 for murmur EF 60-65% mild MR, mild/mod AS mean gradient 10.5 mmHg peak 21.4 mmHg DVI 0.34 AVA 1.4 cm2  06/17/23 seen in ED for weakness having COVID infection ? UTI/pneumonia as well. 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 Stann Earnest   Chest pain, unspecified    Hyperlipidemia    Hypertension    Irritable bladder    Lymphedema of arm    Right,  post lumph node infection   Osteopenia    Pneumonia    5 years ago   Polyp of gallbladder    POLYP, GALLBLADDER 09/18/2009   Qualifier: Diagnosis of   By: Nadean August         Primary osteoarthritis of right knee 05/04/2019   Thyroid  nodule    Past Surgical History:  Procedure Laterality Date   ABDOMINAL HYSTERECTOMY     CHOLECYSTECTOMY N/A 05/08/2017   Procedure: LAPAROSCOPIC CHOLECYSTECTOMY WITH INTRAOPERATIVE CHOLANGIOGRAM;  Surgeon: Juanita Norlander, MD;  Location: [facility];  Service: General;  Laterality: N/A;  ERAS PATHWAY   EYE SURGERY     cataract surgery bilateral   KNEE ARTHROSCOPY Left 08/10/2014   Procedure: LEFT KNEE ARTHROSCOPY WITH MENSCIAL DEBRIDEMENT CONDROPLASTY;  Surgeon: Liliane Rei, MD;  Location: [facility];  Service: Orthopedics;  Laterality: Left;   MASTECTOMY     right breast   RECONSTRUCTION BREAST W/ TRAM FLAP  1997   after right mastectomy   TONSILLECTOMY     TOTAL KNEE ARTHROPLASTY Right 05/04/2019   Procedure: RIGHT TOTAL KNEE ARTHROPLASTY;  Surgeon: Dayne Even, MD;  Location: [facility];  Service: Orthopedics;  Laterality: Right;   VESICOVAGINAL FISTULA CLOSURE W/ TAH      Allergies  Allergies  Allergen Reactions   Sulfacetamide Sodium-Sulfur Anaphylaxis   Ramipril Dermatitis   Sulfonamide Derivatives  Other Reaction(s): Unknown   Tobradex [Tobramycin-Dexamethasone ] Itching    redness    EKGs/Labs/Other Studies Reviewed:   The following studies were reviewed today:   ADDENDUM REPORT: 01/05/2020 11:02   CLINICAL DATA:  Risk stratification   EXAM: Coronary Calcium  Score   TECHNIQUE: The patient was scanned on a Siemens Somatom 64 slice scanner. Axial non-contrast 3 mm slices were carried out through the heart. The data set was analyzed on a dedicated work station and scored using the Agatson method.   FINDINGS: Non-cardiac: See separate report from [facility].   Ascending aorta: Normal diameter 3.6 cm   Pericardium:  Normal   Coronary arteries: Calcium  noted in LAD and circumflex   IMPRESSION: Coronary calcium  score of 273. This was 17 th percentile for age and sex matched control.   Janelle Mediate     Electronically Signed   By: Janelle Mediate M.D.   On: 01/05/2020 11:02    EKG:  EKG is  ordered today.  The ekg ordered today demonstrates NSR, rate 71 bpm with stable first degree AV block  Recent Labs: 06/17/2023: ALT 49; BUN 7; Creatinine, Ser 0.66; Hemoglobin 12.1; Platelets 171; Potassium 3.5; Sodium 137  Recent Lipid Panel    Component Value Date/Time   CHOL 146 10/16/2020 0822   TRIG 111 10/16/2020 0822   HDL 60 10/16/2020 0822   CHOLHDL 2.4 10/16/2020 0822   CHOLHDL 3.0 07/10/2009 0340   VLDL 32 07/10/2009 0340   LDLCALC 66 10/16/2020 0822    Home Medications   Current Meds  Medication Sig   amLODipine  (NORVASC ) 5 MG tablet Take 5 mg by mouth at bedtime.   aspirin  EC 81 MG tablet Take 81 mg by mouth daily. Swallow whole.   Calcium -Vitamin D (CALTRATE 600 PLUS-VIT D PO) Take 2 tablets by mouth at bedtime.    cephALEXin  (KEFLEX ) 250 MG capsule Take 250 mg by mouth daily.   clobetasol ointment (TEMOVATE) 0.05 % Apply 1 application topically 2 (two) times daily as needed (for lichen sclerosus).    denosumab  (PROLIA ) 60 MG/ML SOLN injection Inject 60 mg into the skin every 6 (six) months.    DHA-EPA-Eve Prim Oil-Vit E (EPA/GLA) CAPS Take 2 capsules by mouth at bedtime. HydroEye Softgels Dry Eye Relief   estradiol (ESTRACE) 0.1 MG/GM vaginal cream Place 1 Applicatorful vaginally 3 (three) times a week.   losartan  (COZAAR ) 100 MG tablet Take 100 mg by mouth at bedtime.    Multiple Vitamins-Minerals (PRESERVISION AREDS 2) CAPS Take 1 capsule by mouth daily. (Patient taking differently: Take 1 capsule by mouth daily.)   MYRBETRIQ 50 MG TB24 tablet Take 50 mg by mouth daily.   Omega-3 Fatty Acids (FISH OIL) 1200 MG CPDR Take 1,200 mg by mouth at bedtime.    pantoprazole (PROTONIX) 40 MG  tablet Take 40 mg by mouth daily. (Patient taking differently: Take 40 mg by mouth daily.)   rosuvastatin  (CRESTOR ) 20 MG tablet TAKE 1 TABLET EVERY OTHER DAY ALTERNATING WITH 1/2 TABLET EVERY OTHER DAY. (Patient taking differently: Take 20 mg by mouth daily. TAKE 1 TABLET EVERY OTHER DAY ALTERNATING WITH 1/2 TABLET EVERY OTHER DAY.)     Review of Systems      All other systems reviewed and are otherwise negative except as noted above.  Physical Exam    VS:  BP (!) 140/70   Pulse 72   Ht 5' 5 (1.651 m)   Wt 165 lb 3.2 oz (74.9 kg)   SpO2 97%  BMI 27.49 kg/m  , BMI Body mass index is 27.49 kg/m.  Wt Readings from Last 3 Encounters:  07/04/23 165 lb 3.2 oz (74.9 kg)  06/15/23 164 lb 14.5 oz (74.8 kg)  06/04/23 165 lb (74.8 kg)    Affect appropriate Healthy:  appears stated age HEENT: normal Neck supple with no adenopathy JVP normal no bruits no thyromegaly Lungs clear with no wheezing and good diaphragmatic motion Heart:  S1/S2 3/6 SEM murmur, no rub, gallop or click PMI normal Abdomen: benighn, BS positve, no tenderness, no AAA no bruit.  No HSM or HJR Distal pulses intact with no bruits No edema Neuro non-focal Skin warm and dry No muscular weakness   Assessment & Plan    CAD -no chest pain or SOB -Continue GDMT: Aspirin  81 mg, Cozaar  100 mg daily, Crestor  20 mg daily, Norvasc  5 mg daily  Hypertension -Blood pressure well-controlled today -Continue current medications  Hyperlipidemia -Recent labs with LDL 80, goal less than 70 -PCP already increased Crestor  to 20 mg daily -Repeat lipid panel in 8 weeks (ordered by PCP)  Lung nodules -CT scan every few years for surveillance -Per PCP  First-degree block -Stable on EKG today -Continue current medications, not on a beta-blocker  Aortic Stenosis  -echo mild/mod AS 02/07/22 - update TTE    TTE for AS     Disposition: Follow up 1 year with Janelle Mediate, MD or APP.  Signed, Janelle Mediate,  MD 07/04/2023, 9:27 AM [facility]

## 2023-07-04 ENCOUNTER — Encounter: Payer: Self-pay | Admitting: Cardiovascular Disease

## 2023-07-04 ENCOUNTER — Ambulatory Visit: Payer: Medicare Other | Attending: Cardiovascular Disease | Admitting: Cardiovascular Disease

## 2023-07-04 VITALS — BP 140/70 | HR 72 | Ht 65.0 in | Wt 165.2 lb

## 2023-07-04 DIAGNOSIS — I350 Nonrheumatic aortic (valve) stenosis: Secondary | ICD-10-CM | POA: Diagnosis not present

## 2023-07-04 DIAGNOSIS — R011 Cardiac murmur, unspecified: Secondary | ICD-10-CM | POA: Diagnosis not present

## 2023-07-04 DIAGNOSIS — I10 Essential (primary) hypertension: Secondary | ICD-10-CM | POA: Insufficient documentation

## 2023-07-04 NOTE — Patient Instructions (Addendum)
 Medication Instructions:  Your physician recommends that you continue on your current medications as directed. Please refer to the Current Medication list given to you today.   *If you need a refill on your cardiac medications before your next appointment, please call your pharmacy*  Lab Work: If you have labs (blood work) drawn today and your tests are completely normal, you will receive your results only by: MyChart Message (if you have MyChart) OR A paper copy in the mail If you have any lab test that is abnormal or we need to change your treatment, we will call you to review the results.  Follow-Up: At [facility], you and your health needs are our priority.  As part of our continuing mission to provide you with exceptional heart care, our providers are all part of one team.  This team includes your primary Cardiologist (physician) and Advanced Practice Providers or APPs (Physician Assistants and Nurse Practitioners) who all work together to provide you with the care you need, when you need it.  Your next appointment:   1 year(s)  We recommend signing up for the patient portal called MyChart.  Sign up information is provided on this After Visit Summary.  MyChart is used to connect with patients for Virtual Visits (Telemedicine).  Patients are able to view lab/test results, encounter notes, upcoming appointments, etc.  Non-urgent messages can be sent to your provider as well.   To learn more about what you can do with MyChart, go to [email or web address].

## 2023-07-14 DIAGNOSIS — J1282 Pneumonia due to coronavirus disease 2019: Secondary | ICD-10-CM | POA: Diagnosis not present

## 2023-07-14 DIAGNOSIS — M810 Age-related osteoporosis without current pathological fracture: Secondary | ICD-10-CM | POA: Diagnosis not present

## 2023-09-04 DIAGNOSIS — H53143 Visual discomfort, bilateral: Secondary | ICD-10-CM | POA: Diagnosis not present

## 2023-09-04 DIAGNOSIS — H3530 Unspecified macular degeneration: Secondary | ICD-10-CM | POA: Diagnosis not present

## 2023-09-04 DIAGNOSIS — H43813 Vitreous degeneration, bilateral: Secondary | ICD-10-CM | POA: Diagnosis not present

## 2023-09-04 DIAGNOSIS — H2513 Age-related nuclear cataract, bilateral: Secondary | ICD-10-CM | POA: Diagnosis not present

## 2023-09-26 DIAGNOSIS — Z23 Encounter for immunization: Secondary | ICD-10-CM | POA: Diagnosis not present

## 2023-12-02 DIAGNOSIS — Z85828 Personal history of other malignant neoplasm of skin: Secondary | ICD-10-CM | POA: Diagnosis not present

## 2023-12-02 DIAGNOSIS — L821 Other seborrheic keratosis: Secondary | ICD-10-CM | POA: Diagnosis not present

## 2023-12-02 DIAGNOSIS — L817 Pigmented purpuric dermatosis: Secondary | ICD-10-CM | POA: Diagnosis not present

## 2023-12-02 DIAGNOSIS — L84 Corns and callosities: Secondary | ICD-10-CM | POA: Diagnosis not present

## 2023-12-02 DIAGNOSIS — L853 Xerosis cutis: Secondary | ICD-10-CM | POA: Diagnosis not present

## 2023-12-02 DIAGNOSIS — L738 Other specified follicular disorders: Secondary | ICD-10-CM | POA: Diagnosis not present

## 2023-12-02 DIAGNOSIS — D1801 Hemangioma of skin and subcutaneous tissue: Secondary | ICD-10-CM | POA: Diagnosis not present

## 2023-12-02 DIAGNOSIS — L245 Irritant contact dermatitis due to other chemical products: Secondary | ICD-10-CM | POA: Diagnosis not present

## 2023-12-02 DIAGNOSIS — L812 Freckles: Secondary | ICD-10-CM | POA: Diagnosis not present

## 2023-12-29 DIAGNOSIS — E7800 Pure hypercholesterolemia, unspecified: Secondary | ICD-10-CM | POA: Diagnosis not present

## 2023-12-29 DIAGNOSIS — I10 Essential (primary) hypertension: Secondary | ICD-10-CM | POA: Diagnosis not present

## 2023-12-29 DIAGNOSIS — R7303 Prediabetes: Secondary | ICD-10-CM | POA: Diagnosis not present

## 2023-12-29 DIAGNOSIS — M810 Age-related osteoporosis without current pathological fracture: Secondary | ICD-10-CM | POA: Diagnosis not present

## 2024-01-02 DIAGNOSIS — I700 Atherosclerosis of aorta: Secondary | ICD-10-CM | POA: Diagnosis not present

## 2024-01-02 DIAGNOSIS — I2510 Atherosclerotic heart disease of native coronary artery without angina pectoris: Secondary | ICD-10-CM | POA: Diagnosis not present

## 2024-01-02 DIAGNOSIS — I10 Essential (primary) hypertension: Secondary | ICD-10-CM | POA: Diagnosis not present

## 2024-01-02 DIAGNOSIS — N3090 Cystitis, unspecified without hematuria: Secondary | ICD-10-CM | POA: Diagnosis not present

## 2024-01-02 DIAGNOSIS — M810 Age-related osteoporosis without current pathological fracture: Secondary | ICD-10-CM | POA: Diagnosis not present

## 2024-01-02 DIAGNOSIS — E7800 Pure hypercholesterolemia, unspecified: Secondary | ICD-10-CM | POA: Diagnosis not present

## 2024-01-02 DIAGNOSIS — R3915 Urgency of urination: Secondary | ICD-10-CM | POA: Diagnosis not present

## 2024-01-02 DIAGNOSIS — N3020 Other chronic cystitis without hematuria: Secondary | ICD-10-CM | POA: Diagnosis not present

## 2024-01-02 DIAGNOSIS — K8020 Calculus of gallbladder without cholecystitis without obstruction: Secondary | ICD-10-CM | POA: Diagnosis not present

## 2024-01-02 DIAGNOSIS — R159 Full incontinence of feces: Secondary | ICD-10-CM | POA: Diagnosis not present

## 2024-01-02 DIAGNOSIS — Z0000 Encounter for general adult medical examination without abnormal findings: Secondary | ICD-10-CM | POA: Diagnosis not present
